# Patient Record
Sex: Male | Born: 1935 | Race: Black or African American | Hispanic: No | Marital: Single | Smoking: Current every day smoker
Health system: Southern US, Community
[De-identification: ages and names within clinical notes are randomized; demographics above are authoritative.]

## PROBLEM LIST (undated history)

## (undated) DIAGNOSIS — D696 Thrombocytopenia, unspecified: Secondary | ICD-10-CM

## (undated) DIAGNOSIS — I2510 Atherosclerotic heart disease of native coronary artery without angina pectoris: Secondary | ICD-10-CM

## (undated) DIAGNOSIS — F0390 Unspecified dementia without behavioral disturbance: Secondary | ICD-10-CM

## (undated) DIAGNOSIS — H409 Unspecified glaucoma: Secondary | ICD-10-CM

## (undated) DIAGNOSIS — I10 Essential (primary) hypertension: Secondary | ICD-10-CM

## (undated) HISTORY — PX: OTHER SURGICAL HISTORY: SHX169

---

## 2005-11-24 ENCOUNTER — Ambulatory Visit: Payer: Self-pay | Admitting: Family Medicine

## 2013-08-11 ENCOUNTER — Ambulatory Visit: Payer: Self-pay | Admitting: Internal Medicine

## 2015-12-02 ENCOUNTER — Emergency Department
Admission: EM | Admit: 2015-12-02 | Discharge: 2015-12-02 | Disposition: A | Discharge status: Home / Self Care | Payer: Medicare Other | Attending: Emergency Medicine | Admitting: Emergency Medicine

## 2015-12-02 ENCOUNTER — Encounter: Payer: Self-pay | Admitting: Emergency Medicine

## 2015-12-02 DIAGNOSIS — R21 Rash and other nonspecific skin eruption: Secondary | ICD-10-CM | POA: Diagnosis present

## 2015-12-02 DIAGNOSIS — L309 Dermatitis, unspecified: Secondary | ICD-10-CM

## 2015-12-02 HISTORY — DX: Thrombocytopenia, unspecified: D69.6

## 2015-12-02 HISTORY — DX: Essential (primary) hypertension: I10

## 2015-12-02 HISTORY — DX: Unspecified dementia, unspecified severity, without behavioral disturbance, psychotic disturbance, mood disturbance, and anxiety: F03.90

## 2015-12-02 HISTORY — DX: Atherosclerotic heart disease of native coronary artery without angina pectoris: I25.10

## 2015-12-02 LAB — GLUCOSE, CAPILLARY: GLUCOSE-CAPILLARY: 118 mg/dL — AB (ref 65–99)

## 2015-12-02 MED ORDER — CLOBETASOL PROPIONATE 0.05 % EX CREA: 1.0000 "application " | TOPICAL_CREAM | Freq: Two times a day (BID) | CUTANEOUS | Status: DC

## 2015-12-02 MED ORDER — DIPHENHYDRAMINE HCL 25 MG PO CAPS
25.0000 mg | ORAL_CAPSULE | Freq: Once | ORAL | Status: AC
  Administered 2015-12-02: 25 mg via ORAL
  Filled 2015-12-02: qty 1

## 2015-12-02 NOTE — ED Notes (Addendum)
Pt presents to ED from [facility] with c/o rash from unknown source. Per EMS, facility stated this has been happening for a month; he has been scratching his arms a lot. Pt is alert and oriented on arrival.

## 2015-12-02 NOTE — ED Provider Notes (Signed)
[facility] Emergency Department Provider Note  ____________________________________________  Time seen: Approximately 7:51 PM  I have reviewed the triage vital signs and the nursing notes.   HISTORY  Chief Complaint Allergic Reaction and Rash    HPI Ronald Matthews is a 82 y.o. male presents for evaluation of an itchy rash over his neck and both forearms for well over a month. Patient reports this is likely been there for well over 6 months, but he is frequently itching sometimes point he breaks the skin.  Currently states he is not itching, but was itching at assisted living. This improved after they applied a skin cream.  Denies any fevers chills chest pain or trouble breathing. No changes in medications. No trouble swallowing. No wheezing. He otherwise feels well. He reports he had the same rash several years ago and it went away after treatment but is not quite sure what it was.  Reports he has a history of high blood pressure No past medical history on file.  There are no active problems to display for this patient.   No past surgical history on file.  Current Outpatient Rx  Name  Route  Sig  Dispense  Refill  . clobetasol cream (TEMOVATE) 0.05 %   Topical   Apply 1 application topically 2 (two) times daily.   30 g   0     Allergies Review of patient's allergies indicates no known allergies.  No family history on file.  Social History Social History  Substance Use Topics  . Smoking status: Not on file  . Smokeless tobacco: Not on file  . Alcohol Use: Not on file    Review of Systems Constitutional: No fever/chills Eyes: No visual changes. ENT: No sore throat. Cardiovascular: Denies chest pain. Respiratory: Denies shortness of breath. Gastrointestinal: No abdominal pain.  No nausea, no vomiting.  No diarrhea.  No constipation. Genitourinary: Negative for dysuria. Musculoskeletal: Negative for back pain. Skin: No weeping or  oozing. Reports itchy rash over arms and neck for well over a months. Neurological: Negative for headaches, focal weakness or numbness.  10-point ROS otherwise negative.  ____________________________________________   PHYSICAL EXAM:  VITAL SIGNS: ED Triage Vitals  Enc Vitals Group     BP 12/02/15 1949 150/79 mmHg     Pulse Rate 12/02/15 1949 84     Resp --      Temp 12/02/15 1949 98 F (36.7 C)     Temp Source 12/02/15 1949 Oral     SpO2 12/02/15 1949 98 %     Weight 12/02/15 1949 185 lb 1.6 oz (83.961 kg)     Height 12/02/15 1949  (1.93 m)     Head Cir --      Peak Flow --      Pain Score --      Pain Loc --      Pain Edu? --      Excl. in GC? --    Constitutional: Alert and oriented. Well appearing and in no acute distress. Eyes: Conjunctivae are normal. PERRL. EOMI. Head: Atraumatic. Nose: No congestion/rhinnorhea. Mouth/Throat: Mucous membranes are moist.  Oropharynx non-erythematous. No edema. Neck: No stridor.  Patient has a thick leathery non-erythematous plaque-like rash over the anterior neck. Cardiovascular: Normal rate, regular rhythm. Grossly normal heart sounds.  Good peripheral circulation. Respiratory: Normal respiratory effort.  No retractions. Lungs CTAB. Gastrointestinal: Soft and nontender. No distention. No abdominal bruits. No CVA tenderness. Musculoskeletal: No lower extremity tenderness nor edema.  Neurologic:  Normal speech and language. No gross focal neurologic deficits are appreciated. Skin:  Skin is warm, dry and intact. Patient has a thick, leathery, plaque-like rash over the extensor surfaces of the forearms bilateral.  Psychiatric: Mood and affect are normal. Speech and behavior are normal.  ____________________________________________   LABS (all labs ordered are listed, but only abnormal results are displayed)  Labs Reviewed  CBG MONITORING, ED    ____________________________________________  EKG   ____________________________________________  RADIOLOGY   ____________________________________________   PROCEDURES  Procedure(s) performed: None  Critical Care performed: No  ____________________________________________   INITIAL IMPRESSION / ASSESSMENT AND PLAN / ED COURSE  Pertinent labs & imaging results that were available during my care of the patient were reviewed by me and considered in my medical decision making (see chart for details).  Patient presents with a thick leathery plaque-like rash over the extensor surfaces of the forearms as well as the anterior neck. Has been existing for well over the month but continues to have itching from it. He is hemodynamically stable and no evidence of anaphylaxis, feel this is likely some sort of dermatitis or possibly severe chronic psoriasis based on my assessment. He is awake and alert. No itching at this time, and he reports symptoms did improve with moisturizing cream.  Patient resting comfortably in no distress. States no symptoms presently. I did discuss careful return precautions and notified him that he does need to follow-up with dermatology as we are not sure the cause of this rash. I recommended he follow up with them within a week or next available appointment and provided him referral information. Careful return precautions advised. Patient agreeable ____________________________________________   FINAL CLINICAL IMPRESSION(S) / ED DIAGNOSES  Final diagnoses:  Dermatitis      Sharyn Creamer, MD 12/02/15 2046

## 2015-12-02 NOTE — Discharge Instructions (Signed)
I suspect here rash is due to a chronic inflammatory condition. However, I'm not able to make a certain diagnosis. Does not appear to his causing any emergency complications. It does not appear to be infected. I would advise use of steroid cream once to twice daily until the symptoms improve. Please call and follow up with dermatology within the week, and call your primary care doctor for follow-up tomorrow.  Please return to emergency room right away if you develop fever, a red rash, itching becomes severe, you have trouble breathing, swelling, weakness, or other new concerns arise.

## 2015-12-03 ENCOUNTER — Telehealth: Payer: Self-pay | Admitting: Emergency Medicine

## 2015-12-03 NOTE — ED Notes (Signed)
[facility] and says the cream ordered is not covered by pt insurance.  Per dr Mayford Knife can change rx to triamcinalone cream 0.1% in 80 gm tube.  Apply twice daily.  No refills.

## 2018-07-11 ENCOUNTER — Emergency Department
Admission: EM | Admit: 2018-07-11 | Discharge: 2018-07-11 | Disposition: A | Discharge status: Skilled Nursing Facility | Payer: Medicare Other | Attending: Emergency Medicine | Admitting: Emergency Medicine

## 2018-07-11 ENCOUNTER — Emergency Department: Payer: Medicare Other

## 2018-07-11 ENCOUNTER — Other Ambulatory Visit: Payer: Self-pay

## 2018-07-11 ENCOUNTER — Emergency Department
Admission: EM | Admit: 2018-07-11 | Discharge: 2018-07-11 | Disposition: A | Discharge status: Home / Self Care | Payer: Medicare Other | Source: Home / Self Care | Attending: Emergency Medicine | Admitting: Emergency Medicine

## 2018-07-11 ENCOUNTER — Encounter: Payer: Self-pay | Admitting: Emergency Medicine

## 2018-07-11 DIAGNOSIS — F0390 Unspecified dementia without behavioral disturbance: Secondary | ICD-10-CM

## 2018-07-11 DIAGNOSIS — R29898 Other symptoms and signs involving the musculoskeletal system: Secondary | ICD-10-CM

## 2018-07-11 DIAGNOSIS — R531 Weakness: Secondary | ICD-10-CM | POA: Insufficient documentation

## 2018-07-11 DIAGNOSIS — F17200 Nicotine dependence, unspecified, uncomplicated: Secondary | ICD-10-CM | POA: Insufficient documentation

## 2018-07-11 DIAGNOSIS — Z79899 Other long term (current) drug therapy: Secondary | ICD-10-CM | POA: Insufficient documentation

## 2018-07-11 DIAGNOSIS — I2510 Atherosclerotic heart disease of native coronary artery without angina pectoris: Secondary | ICD-10-CM

## 2018-07-11 DIAGNOSIS — I10 Essential (primary) hypertension: Secondary | ICD-10-CM

## 2018-07-11 LAB — COMPREHENSIVE METABOLIC PANEL
ALK PHOS: 51 U/L (ref 38–126)
ALT: 12 U/L (ref 0–44)
AST: 20 U/L (ref 15–41)
Albumin: 4 g/dL (ref 3.5–5.0)
Anion gap: 7 (ref 5–15)
BILIRUBIN TOTAL: 0.7 mg/dL (ref 0.3–1.2)
BUN: 20 mg/dL (ref 8–23)
CHLORIDE: 107 mmol/L (ref 98–111)
CO2: 26 mmol/L (ref 22–32)
CREATININE: 0.91 mg/dL (ref 0.61–1.24)
Calcium: 8.9 mg/dL (ref 8.9–10.3)
GFR calc Af Amer: >60 mL/min (ref >60)
Glucose, Bld: 107 mg/dL — ABNORMAL HIGH (ref 70–99)
Potassium: 4.3 mmol/L (ref 3.5–5.1)
Sodium: 140 mmol/L (ref 135–145)
Total Protein: 7 g/dL (ref 6.5–8.1)

## 2018-07-11 LAB — URINALYSIS, ROUTINE W REFLEX MICROSCOPIC
Bilirubin Urine: NEGATIVE
GLUCOSE, UA: NEGATIVE mg/dL
Ketones, ur: 5 mg/dL — AB
Leukocytes, UA: NEGATIVE
Nitrite: NEGATIVE
PROTEIN: NEGATIVE mg/dL
SQUAMOUS EPITHELIAL / LPF: NONE SEEN (ref 0–5)
Specific Gravity, Urine: 1.019 (ref 1.005–1.030)
pH: 6 (ref 5.0–8.0)

## 2018-07-11 LAB — CBC
HCT: 39.3 % — ABNORMAL LOW (ref 40.0–52.0)
HEMOGLOBIN: 13.1 g/dL (ref 13.0–18.0)
MCH: 28.7 pg (ref 26.0–34.0)
MCHC: 33.4 g/dL (ref 32.0–36.0)
MCV: 86 fL (ref 80.0–100.0)
Platelets: 187 10*3/uL (ref 150–440)
RBC: 4.57 MIL/uL (ref 4.40–5.90)
RDW: 14.6 % — ABNORMAL HIGH (ref 11.5–14.5)
WBC: 4.6 10*3/uL (ref 3.8–10.6)

## 2018-07-11 LAB — TROPONIN I

## 2018-07-11 NOTE — ED Notes (Signed)
Pt resting quietly. Pt is waiting for ems to transport.

## 2018-07-11 NOTE — ED Notes (Signed)
When speaking with the other triage nurse, EMS brought pt back because his place of residence is requesting him to have an MRI.

## 2018-07-11 NOTE — Discharge Instructions (Signed)
Please return to the emergency department if you develop any symptoms concerning to you.

## 2018-07-11 NOTE — ED Notes (Signed)
Spoke to [employer] at [facility] [phone]) who stated to send pt back by ambulance.

## 2018-07-11 NOTE — ED Triage Notes (Signed)
Pt reports that he does not know why they keep bringing him over here, he was here earlier today and he feels fine. He reports that he does not fell any more weak than he usually does.

## 2018-07-11 NOTE — Discharge Instructions (Addendum)
You have been seen in the emergency department today for weakness.  Your work-up has shown normal results.  Please follow-up with your primary care doctor as soon as possible for recheck/reevaluation.  Return to the emergency department for any symptoms personally concerning to yourself or staff members.

## 2018-07-11 NOTE — ED Notes (Signed)
In and out cath completed at this time. Sterile technique maintained. Patient tolerated well. Clear, amber urine returned.

## 2018-07-11 NOTE — ED Notes (Signed)
Per MD pt is ready for d/c.

## 2018-07-11 NOTE — ED Provider Notes (Signed)
[facility] Emergency Department Provider Note  Time seen: 9:01 AM  I have reviewed the triage vital signs and the nursing notes.   HISTORY  Chief Complaint Weakness    HPI Ronald Matthews is a 84 y.o. male with a past medical history of CAD, hypertension, dementia, presents to the emergency department for lower extreme any weakness.  According to the patient he has been weak in his legs for a long time now per patient.  States he lives at a nursing home and they wanted him to come get checked out.  Patient denies any focal weakness.  Denies any weakness in arm or leg headache confusion difficulty thinking or speaking.  Patient is well-appearing and has no complaints at this time.  Largely negative review of systems.   Past Medical History:  Diagnosis Date  . Coronary artery disease   . Dementia   . Hypertension   . Thrombocytopenia (HCC)     There are no active problems to display for this patient.   History reviewed. No pertinent surgical history.  Prior to Admission medications   Medication Sig Start Date End Date Taking? Authorizing Provider  aspirin EC 81 MG tablet Take 81 mg by mouth daily.    [provider]  clobetasol cream (TEMOVATE) 0.05 % Apply 1 application topically 2 (two) times daily. 12/02/15   Sharyn Creamer, MD  metoprolol tartrate (LOPRESSOR) 25 MG tablet Take 25 mg by mouth 2 (two) times daily.    [provider]    No Known Allergies  No family history on file.  Social History Social History   Tobacco Use  . Smoking status: Current Every Day Smoker  . Smokeless tobacco: Never Used  Substance Use Topics  . Alcohol use: No  . Drug use: No    Review of Systems Constitutional: Negative for fever. ENT: Negative for recent illness/congestion Cardiovascular: Negative for chest pain. Respiratory: Negative for shortness of breath. Gastrointestinal: Negative for abdominal pain, vomiting and  diarrhea. Genitourinary: Negative for urinary compaints Musculoskeletal: Negative for musculoskeletal complaints Skin: Negative for skin complaints  Neurological: Negative for headache.  Mild lower extremity weakness bilaterally states this is chronic however. All other ROS negative  ____________________________________________   PHYSICAL EXAM:  VITAL SIGNS: ED Triage Vitals  Enc Vitals Group     BP 07/11/18 0858 (!) 148/76     Pulse Rate 07/11/18 0858 60     Resp 07/11/18 0858 18     Temp 07/11/18 0858 98.5 F (36.9 C)     Temp Source 07/11/18 0858 Oral     SpO2 07/11/18 0858 100 %     Weight 07/11/18 0859 185 lb 3 oz (84 kg)     Height 07/11/18 0859 6\' 4"  (1.93 m)     Head Circumference --      Peak Flow --      Pain Score --      Pain Loc --      Pain Edu? --      Excl. in GC? --    Constitutional: Alert. Well appearing and in no distress. Eyes: Normal exam ENT   Head: Normocephalic and atraumatic.   Mouth/Throat: Mucous membranes are moist. Cardiovascular: Normal rate, regular rhythm. No murmur Respiratory: Normal respiratory effort without tachypnea nor retractions. Breath sounds are clear Gastrointestinal: Soft and nontender. No distention.   Musculoskeletal: Nontender with normal range of motion in all extremities. No lower extremity tenderness or edema. Neurologic:  Normal speech and language. No gross  focal neurologic deficits.  Great grip strength bilaterally, good leg strength bilaterally, no focal deficit identified. Skin:  Skin is warm, dry and intact.  Psychiatric: Mood and affect are normal.   ____________________________________________    EKG  EKG reviewed and interpreted by myself shows normal sinus rhythm at 68 bpm with a narrow QRS, normal axis, normal intervals, no concerning ST changes.  Overall normal EKG.  ____________________________________________    RADIOLOGY  CT scan negative for acute  abnormality.  ____________________________________________   INITIAL IMPRESSION / ASSESSMENT AND PLAN / ED COURSE  Pertinent labs & imaging results that were available during my care of the patient were reviewed by me and considered in my medical decision making (see chart for details).  Patient presents to the emergency department for lower extremity weakness sent by his nursing facility.  Differential would include CVA, electrolyte or metabolic abnormality, infectious etiology such as urinary tract infection.  We will check labs, IV hydrate, check urinalysis as well as CT scan of the head as a precaution.  Overall the patient appears very well.  CT scan was negative for acute abnormality.  Labs are largely within normal limits including a negative troponin, overall well-appearing urinalysis.  Urine culture has been sent as a precaution.  As the patient appears well otherwise I believe he is safe for discharge home.  We will attempt to ambulate the patient to ensure he is able to walk safely prior to discharge.  ____________________________________________   FINAL CLINICAL IMPRESSION(S) / ED DIAGNOSES  Weakness    Minna Antis, MD 07/11/18 1034

## 2018-07-11 NOTE — ED Triage Notes (Signed)
Pt came to ED via EMS from [facility] assisted [facility]. Pt reports weakness, specifically in legs. Pt reports has been going for some time. Denies pain, some numbness in both legs. VS stable.

## 2018-07-11 NOTE — ED Provider Notes (Addendum)
[facility] Emergency Department Provider Note  ____________________________________________  Time seen: Approximately 5:44 PM  I have reviewed the triage vital signs and the nursing notes.   HISTORY  Chief Complaint Weakness    HPI Ronald Matthews is a 84 y.o. male with a history of HTN, CAD, and dementia living in a nursing facility, sent back to the emergency department for second time today for bilateral lower extremity weakness.  The patient has no complaints and did not feel that he needed to come back to the emergency department.  He states that over a period of weeks, he has progressively had more difficulty walking; he used to be able to get around with a cane, and is finding this more difficult now.  He denies any saddle anesthesia, urinary or fecal incontinence or retention, numbness or tingling, or any focal weakness.  He has had no headaches.  The patient was seen in the emergency department this morning with a reassuring work-up including stable vital signs, normal laboratory studies, and a CT scan of the head that did not show any acute intracranial process.  He was discharged back to his nursing facility and is now re-presenting with a note that states that a physician would like him to have an MRI but I am unable to corroborate this or talk to the physician who sent him.  Past Medical History:  Diagnosis Date  . Coronary artery disease   . Dementia   . Hypertension   . Thrombocytopenia (HCC)     There are no active problems to display for this patient.   History reviewed. No pertinent surgical history.  Current Outpatient Rx  . Order #: [id number] Class: Historical Med  . Order #: [id number] Class: Print  . Order #: [id number] Class: Historical Med    Allergies Patient has no known allergies.  History reviewed. No pertinent family history.  Social History Social History   Tobacco Use  . Smoking status: Current Every Day Smoker  .  Smokeless tobacco: Never Used  Substance Use Topics  . Alcohol use: No  . Drug use: No    Review of Systems Constitutional: No fever/chills.  No lightheadedness or fainting.  No recent falls. Eyes: No visual changes.  No blurred or double vision. ENT: No sore throat. No congestion or rhinorrhea. Cardiovascular: Denies chest pain. Denies palpitations. Respiratory: Denies shortness of breath.  No cough. Gastrointestinal: No abdominal pain.  No nausea, no vomiting.  No diarrhea.  No constipation. Genitourinary: Negative for dysuria. Musculoskeletal: Negative for back pain.  No neck pain.  Positive difficulty walking over the last few weeks due to bilateral lower extremity weakness. Skin: Negative for rash. Neurological: Negative for headaches. No focal numbness, tingling.  Bilateral lower externally weakness.  No changes in vision or speech.    ____________________________________________   PHYSICAL EXAM:  VITAL SIGNS: ED Triage Vitals  Enc Vitals Group     BP 07/11/18 1546 131/64     Pulse --      Resp 07/11/18 1546 16     Temp 07/11/18 1546 98.9 F (37.2 C)     Temp src --      SpO2 07/11/18 1546 99 %     Weight 07/11/18 1547 185 lb 3 oz (84 kg)     Height 07/11/18 1547 6\' 4"  (1.93 m)     Head Circumference --      Peak Flow --      Pain Score 07/11/18 1546 0     Pain Loc --  Pain Edu? --      Excl. in GC? --     Constitutional: The patient is alert and able to answer some questions; overall he is appropriate and in no acute distress.. Eyes: Conjunctivae are normal.  EOMI. positive arcus senilis.  PERRLA.  No scleral icterus. Head: Atraumatic. Nose: No congestion/rhinnorhea. Mouth/Throat: Mucous membranes are moist.  Neck: No stridor.  Supple.  No JVD.  No midline C-spine tenderness to palpation, step-offs or deformities. Cardiovascular: Normal rate, regular rhythm. No murmurs, rubs or gallops.  Respiratory: Normal respiratory effort.  No accessory muscle use or  retractions. Lungs CTAB.  No wheezes, rales or ronchi. Gastrointestinal: Soft, nontender and nondistended.  No guarding or rebound.  No peritoneal signs. Musculoskeletal: No midline thoracic or lumbar spine tenderness to palpation, step-offs or deformities.  No LE edema. No ttp in the calves or palpable cords.  Negative Homan's sign. Neurologic: Alert and oriented 2. Speech is clear.  Face and smile symmetric.  EOMI.  PERRLA.  No horizontal or vertical nystagmus. No pronator drift. 5 out of 5 grip, biceps, triceps, hip flexors, plantar flexion and dorsiflexion. Normal sensation to light touch in the bilateral upper and lower extremities, and face.  I did ambulate the patient, who used a cane and had some instability but no ataxia. Skin:  Skin is warm, dry and intact. No rash noted. Psychiatric: Mood and affect are normal.   ____________________________________________   LABS (all labs ordered are listed, but only abnormal results are displayed)  Labs Reviewed - No data to display ____________________________________________  EKG  Not indicated ____________________________________________  RADIOLOGY  Ct Head Wo Contrast  Result Date: 07/11/2018 CLINICAL DATA:  84 year old male with weakness specifically in legs ongoing for some time. Denies pain. Initial encounter. EXAM: CT HEAD WITHOUT CONTRAST TECHNIQUE: Contiguous axial images were obtained from the base of the skull through the vertex without intravenous contrast. COMPARISON:  None. FINDINGS: Brain: No intracranial hemorrhage. Prominent chronic microvascular changes. This limits evaluation for detection of a small infarct (particularly at the level of the anterior limb of the right internal capsule). No CT evidence of large acute infarct. Remote small basal ganglia infarcts. Moderate global atrophy. Dilated right occipital horn appears long-standing. Prominent size ventricles probably related to central atrophy rather than hydrocephalus.  Aqueduct is patent. Slightly hypoplastic inferior vermis. No intracranial mass lesion noted on this unenhanced exam. Vascular: Vascular calcifications.  No hyperdense vessel. Skull: No acute abnormality. Sinuses/Orbits: No acute orbital abnormality. Minimal mucosal thickening right maxillary sinus. Other: Mastoid air cells and middle ear cavities are clear. IMPRESSION: 1. No intracranial hemorrhage. 2. Prominent chronic microvascular changes. This limits evaluation for detection of a small infarct (particularly at the level of the anterior limb of the right internal capsule). No CT evidence of large acute infarct. Remote small basal ganglia infarcts. 3. Moderate global atrophy. Dilated right occipital horn appears long-standing. Prominent size ventricles probably related to central atrophy rather than hydrocephalus. Electronically Signed   By: Lacy Duverney M.D.   On: 07/11/2018 10:00    ____________________________________________   PROCEDURES  Procedure(s) performed: None  Procedures  Critical Care performed: No ____________________________________________   INITIAL IMPRESSION / ASSESSMENT AND PLAN / ED COURSE  Pertinent labs & imaging results that were available during my care of the patient were reviewed by me and considered in my medical decision making (see chart for details).  84 y.o. male with dementia presenting for bilateral lower extremity weakness, second presentation today with a note from the nursing  facility that they wish for him to have an MRI.  I do not even know what kind of MR was recommended to the patient by his physician; neither MRI of the brain or lumbar spine are indicated in an emergency situation.  There is no evidence of acute neurologic deficit or focal stroke and the patient CT scan was negative.  There is no evidence of spinal cord compression or cauda equina today.  After talking to the nursing facility, I was given the number for [facility], [phone], and  have left a message to to see if there were any additional concerns.  ----------------------------------------- 6:34 PM on 07/11/2018 -----------------------------------------  I have spoken with Ree Kida, the NP from the nursing facility, and then afterwards Dr. Ardelle Park, the attending physician for the facility; neither of these practitioners have seen the patient today.  I have explained the patient's work-up this morning as well as his current status.  Dr. Ardelle Park agrees that the patient does not require emergent MRI tonight and will see the patient in the morning.  ____________________________________________  FINAL CLINICAL IMPRESSION(S) / ED DIAGNOSES  Final diagnoses:  Bilateral leg weakness         NEW MEDICATIONS STARTED DURING THIS VISIT:  New Prescriptions   No medications on file      Rockne Menghini, MD 07/11/18 1757    Rockne Menghini, MD 07/11/18 [phone]

## 2018-07-11 NOTE — ED Notes (Signed)
Report called to [facility] facility spoke with Liborio Nixon, informed that pt will be returning to facility via ems.

## 2018-07-11 NOTE — ED Notes (Signed)
[facility]  CALLED  FOR  TRANSPORT 

## 2018-07-12 LAB — URINE CULTURE

## 2018-07-13 ENCOUNTER — Emergency Department: Payer: Medicare Other

## 2018-07-13 ENCOUNTER — Other Ambulatory Visit: Payer: Self-pay

## 2018-07-13 ENCOUNTER — Emergency Department
Admission: EM | Admit: 2018-07-13 | Discharge: 2018-07-13 | Disposition: A | Discharge status: Home / Self Care | Payer: Medicare Other | Attending: Emergency Medicine | Admitting: Emergency Medicine

## 2018-07-13 DIAGNOSIS — Y999 Unspecified external cause status: Secondary | ICD-10-CM | POA: Diagnosis not present

## 2018-07-13 DIAGNOSIS — I10 Essential (primary) hypertension: Secondary | ICD-10-CM | POA: Insufficient documentation

## 2018-07-13 DIAGNOSIS — Z79899 Other long term (current) drug therapy: Secondary | ICD-10-CM | POA: Insufficient documentation

## 2018-07-13 DIAGNOSIS — Y939 Activity, unspecified: Secondary | ICD-10-CM | POA: Insufficient documentation

## 2018-07-13 DIAGNOSIS — Y929 Unspecified place or not applicable: Secondary | ICD-10-CM | POA: Diagnosis not present

## 2018-07-13 DIAGNOSIS — W050XXA Fall from non-moving wheelchair, initial encounter: Secondary | ICD-10-CM | POA: Diagnosis not present

## 2018-07-13 DIAGNOSIS — T1490XA Injury, unspecified, initial encounter: Secondary | ICD-10-CM | POA: Diagnosis not present

## 2018-07-13 DIAGNOSIS — I2510 Atherosclerotic heart disease of native coronary artery without angina pectoris: Secondary | ICD-10-CM | POA: Insufficient documentation

## 2018-07-13 DIAGNOSIS — Z7982 Long term (current) use of aspirin: Secondary | ICD-10-CM | POA: Insufficient documentation

## 2018-07-13 DIAGNOSIS — W19XXXA Unspecified fall, initial encounter: Secondary | ICD-10-CM

## 2018-07-13 DIAGNOSIS — F17200 Nicotine dependence, unspecified, uncomplicated: Secondary | ICD-10-CM | POA: Diagnosis not present

## 2018-07-13 DIAGNOSIS — F0390 Unspecified dementia without behavioral disturbance: Secondary | ICD-10-CM | POA: Insufficient documentation

## 2018-07-13 NOTE — ED Notes (Signed)
Called EMS and spoke with [facility].

## 2018-07-13 NOTE — ED Provider Notes (Signed)
[facility] Emergency Department Provider Note   ____________________________________________   First MD Initiated Contact with Patient 07/13/18 1748     (approximate)  I have reviewed the triage vital signs and the nursing notes.   HISTORY  Chief Complaint No chief complaint on file.  Chief complaint is unwitnessed fall  HPI Ronald Matthews is a 84 y.o. male patient says he was trying to get into his wheelchair when he missed his sitting and fell.  He was sent here because his fall was unwitnessed.  He denies any pain says everything is feeling well.  Past Medical History:  Diagnosis Date  . Coronary artery disease   . Dementia   . Hypertension   . Thrombocytopenia (HCC)     There are no active problems to display for this patient.   History reviewed. No pertinent surgical history.  Prior to Admission medications   Medication Sig Start Date End Date Taking? Authorizing Provider  aspirin EC 81 MG tablet Take 81 mg by mouth daily.    [provider]  clobetasol cream (TEMOVATE) 0.05 % Apply 1 application topically 2 (two) times daily. 12/02/15   Sharyn Creamer, MD  metoprolol tartrate (LOPRESSOR) 25 MG tablet Take 25 mg by mouth 2 (two) times daily.    [provider]    Allergies Patient has no known allergies.  History reviewed. No pertinent family history.  Social History Social History   Tobacco Use  . Smoking status: Current Every Day Smoker  . Smokeless tobacco: Never Used  Substance Use Topics  . Alcohol use: No  . Drug use: No    Review of Systems  Constitutional: No fever/chills Eyes: No visual changes. ENT: No sore throat. Cardiovascular: Denies chest pain. Respiratory: Denies shortness of breath. Gastrointestinal: No abdominal pain.  No nausea, no vomiting.  No diarrhea.  No constipation. Genitourinary: Negative for dysuria. Musculoskeletal: Negative for back pain. Skin: Negative for  rash. Neurological: Negative for headaches, focal weakness ___________________   PHYSICAL EXAM:  VITAL SIGNS: ED Triage Vitals  Enc Vitals Group     BP --      Pulse Rate 07/13/18 1732 77     Resp 07/13/18 1732 14     Temp 07/13/18 1732 98.8 F (37.1 C)     Temp Source 07/13/18 1732 Oral     SpO2 07/13/18 1732 94 %     Weight 07/13/18 1734 185 lb 3 oz (84 kg)     Height 07/13/18 1734 6\' 4"  (1.93 m)     Head Circumference --      Peak Flow --      Pain Score 07/13/18 1734 0     Pain Loc --      Pain Edu? --      Excl. in GC? --     Constitutional: Alert and oriented. Well appearing and in no acute distress. Eyes: Conjunctivae are normal.  Head: Atraumatic. Nose: No congestion/rhinnorhea. Mouth/Throat: Mucous membranes are moist.  Oropharynx non-erythematous. Neck: No stridor. Cardiovascular: Normal rate, regular rhythm. Grossly normal heart sounds.  Good peripheral circulation. Respiratory: Normal respiratory effort.  No retractions. Lungs CTAB. Gastrointestinal: Soft and nontender. No distention. No abdominal bruits. No CVA tenderness. Musculoskeletal: No lower extremity tenderness nor edema.   Neurologic:  Normal speech and language. No gross focal neurologic deficits are appreciated. Psychiatric: Mood and affect are normal. Speech and behavior are normal.  ____________________________________________   LABS (all labs ordered are listed, but only abnormal results are displayed)  Labs Reviewed - No data to display ____________________________________________  EKG   ____________________________________________  RADIOLOGY  ED MD interpretation: CT of the head and neck read as no acute disease by radiology I have reviewed the films and agree  Official radiology report(s): Ct Head Wo Contrast  Result Date: 07/13/2018 CLINICAL DATA:  Unwitnessed fall EXAM: CT HEAD WITHOUT CONTRAST CT CERVICAL SPINE WITHOUT CONTRAST TECHNIQUE: Multidetector CT imaging of the head  and cervical spine was performed following the standard protocol without intravenous contrast. Multiplanar CT image reconstructions of the cervical spine were also generated. COMPARISON:  07/11/2018 head CT FINDINGS: CT HEAD FINDINGS Brain: No acute territorial infarction, hemorrhage or intracranial mass. Moderate small vessel ischemic changes of the white matter. Probable chronic lacunar infarcts within the bilateral basal ganglia. The ventricles are slightly enlarged, likely due to atrophy. Vascular: No hyperdense vessels. Vertebral and carotid vascular calcification Skull: Normal. Negative for fracture or focal lesion. Sinuses/Orbits: No acute finding. Other: None CT CERVICAL SPINE FINDINGS Alignment: Mild reversal of cervical lordosis. No subluxation. Facet alignment within normal limits. Skull base and vertebrae: No acute fracture. No primary bone lesion or focal pathologic process. Soft tissues and spinal canal: No prevertebral fluid or swelling. No visible canal hematoma. Disc levels: Moderate degenerative changes at C3-C4, C5-C6 and C6-C7 with marked degenerative change at C4-C5. Multiple level facet degenerative changes. Multiple level foraminal narrowing, most significant at C4-C5. Upper chest: Negative. Other: None IMPRESSION: 1. No CT evidence for acute intracranial abnormality. Small-vessel ischemic changes of the white matter and mild atrophy. 2. Reversal of cervical lordosis with degenerative changes. No acute fracture is seen. Electronically Signed   By: Jasmine Pang M.D.   On: 07/13/2018 18:59   Ct Cervical Spine Wo Contrast  Result Date: 07/13/2018 CLINICAL DATA:  Unwitnessed fall EXAM: CT HEAD WITHOUT CONTRAST CT CERVICAL SPINE WITHOUT CONTRAST TECHNIQUE: Multidetector CT imaging of the head and cervical spine was performed following the standard protocol without intravenous contrast. Multiplanar CT image reconstructions of the cervical spine were also generated. COMPARISON:  07/11/2018 head  CT FINDINGS: CT HEAD FINDINGS Brain: No acute territorial infarction, hemorrhage or intracranial mass. Moderate small vessel ischemic changes of the white matter. Probable chronic lacunar infarcts within the bilateral basal ganglia. The ventricles are slightly enlarged, likely due to atrophy. Vascular: No hyperdense vessels. Vertebral and carotid vascular calcification Skull: Normal. Negative for fracture or focal lesion. Sinuses/Orbits: No acute finding. Other: None CT CERVICAL SPINE FINDINGS Alignment: Mild reversal of cervical lordosis. No subluxation. Facet alignment within normal limits. Skull base and vertebrae: No acute fracture. No primary bone lesion or focal pathologic process. Soft tissues and spinal canal: No prevertebral fluid or swelling. No visible canal hematoma. Disc levels: Moderate degenerative changes at C3-C4, C5-C6 and C6-C7 with marked degenerative change at C4-C5. Multiple level facet degenerative changes. Multiple level foraminal narrowing, most significant at C4-C5. Upper chest: Negative. Other: None IMPRESSION: 1. No CT evidence for acute intracranial abnormality. Small-vessel ischemic changes of the white matter and mild atrophy. 2. Reversal of cervical lordosis with degenerative changes. No acute fracture is seen. Electronically Signed   By: Jasmine Pang M.D.   On: 07/13/2018 18:59    ____________________________________________   PROCEDURES  Procedure(s) performed:   Procedures  Critical Care performed:   ____________________________________________   INITIAL IMPRESSION / ASSESSMENT AND PLAN / ED COURSE  Patient looks well has no complaints I will send him back.  He says he just missed where he was sitting when he fell.  ____________________________________________   FINAL CLINICAL IMPRESSION(S) / ED DIAGNOSES  Final diagnoses:  Fall, initial encounter     ED Discharge Orders    None       Note:  This document was prepared using Dragon  voice recognition software and may include unintentional dictation errors.    Arnaldo Natal, MD 07/13/18 Ebony Cargo

## 2018-07-13 NOTE — ED Triage Notes (Signed)
Pt arrived via EMS from [facility] after unwitnessed fall. Denies pain. Pt states that he was trying to get from his bed to his chair and fell from standing position. Denies hitting head.

## 2018-07-13 NOTE — Discharge Instructions (Addendum)
Please return for any further problems °

## 2018-07-14 ENCOUNTER — Encounter (HOSPITAL_COMMUNITY): Payer: Self-pay | Admitting: Emergency Medicine

## 2018-07-14 ENCOUNTER — Emergency Department (HOSPITAL_COMMUNITY): Payer: Medicare Other

## 2018-07-14 ENCOUNTER — Inpatient Hospital Stay (HOSPITAL_COMMUNITY)
Admission: EM | Admit: 2018-07-14 | Discharge: 2018-07-18 | DRG: 065 | Disposition: A | Discharge status: Skilled Nursing Facility | Payer: Medicare Other | Source: Skilled Nursing Facility | Attending: Internal Medicine | Admitting: Internal Medicine

## 2018-07-14 ENCOUNTER — Other Ambulatory Visit: Payer: Self-pay

## 2018-07-14 DIAGNOSIS — R4701 Aphasia: Secondary | ICD-10-CM | POA: Diagnosis present

## 2018-07-14 DIAGNOSIS — D696 Thrombocytopenia, unspecified: Secondary | ICD-10-CM | POA: Diagnosis present

## 2018-07-14 DIAGNOSIS — R1310 Dysphagia, unspecified: Secondary | ICD-10-CM | POA: Diagnosis present

## 2018-07-14 DIAGNOSIS — I10 Essential (primary) hypertension: Secondary | ICD-10-CM | POA: Diagnosis present

## 2018-07-14 DIAGNOSIS — I455 Other specified heart block: Secondary | ICD-10-CM | POA: Diagnosis present

## 2018-07-14 DIAGNOSIS — R471 Dysarthria and anarthria: Secondary | ICD-10-CM | POA: Diagnosis present

## 2018-07-14 DIAGNOSIS — E785 Hyperlipidemia, unspecified: Secondary | ICD-10-CM | POA: Diagnosis present

## 2018-07-14 DIAGNOSIS — Z7982 Long term (current) use of aspirin: Secondary | ICD-10-CM

## 2018-07-14 DIAGNOSIS — I63219 Cerebral infarction due to unspecified occlusion or stenosis of unspecified vertebral arteries: Principal | ICD-10-CM | POA: Diagnosis present

## 2018-07-14 DIAGNOSIS — I639 Cerebral infarction, unspecified: Secondary | ICD-10-CM | POA: Diagnosis present

## 2018-07-14 DIAGNOSIS — G8194 Hemiplegia, unspecified affecting left nondominant side: Secondary | ICD-10-CM | POA: Diagnosis present

## 2018-07-14 DIAGNOSIS — I2510 Atherosclerotic heart disease of native coronary artery without angina pectoris: Secondary | ICD-10-CM | POA: Diagnosis present

## 2018-07-14 DIAGNOSIS — F17200 Nicotine dependence, unspecified, uncomplicated: Secondary | ICD-10-CM | POA: Diagnosis present

## 2018-07-14 DIAGNOSIS — F0150 Vascular dementia without behavioral disturbance: Secondary | ICD-10-CM | POA: Diagnosis present

## 2018-07-14 DIAGNOSIS — F0390 Unspecified dementia without behavioral disturbance: Secondary | ICD-10-CM | POA: Diagnosis present

## 2018-07-14 DIAGNOSIS — I739 Peripheral vascular disease, unspecified: Secondary | ICD-10-CM | POA: Diagnosis present

## 2018-07-14 DIAGNOSIS — R32 Unspecified urinary incontinence: Secondary | ICD-10-CM | POA: Diagnosis present

## 2018-07-14 DIAGNOSIS — Z888 Allergy status to other drugs, medicaments and biological substances status: Secondary | ICD-10-CM

## 2018-07-14 DIAGNOSIS — R29810 Facial weakness: Secondary | ICD-10-CM | POA: Diagnosis present

## 2018-07-14 LAB — COMPREHENSIVE METABOLIC PANEL
ALBUMIN: 4 g/dL (ref 3.5–5.0)
ALT: 15 U/L (ref 0–44)
ANION GAP: 9 (ref 5–15)
AST: 29 U/L (ref 15–41)
Alkaline Phosphatase: 56 U/L (ref 38–126)
BUN: 17 mg/dL (ref 8–23)
CO2: 24 mmol/L (ref 22–32)
Calcium: 9.3 mg/dL (ref 8.9–10.3)
Chloride: 109 mmol/L (ref 98–111)
Creatinine, Ser: 1.03 mg/dL (ref 0.61–1.24)
GFR calc Af Amer: >60 mL/min (ref >60)
GLUCOSE: 113 mg/dL — AB (ref 70–99)
POTASSIUM: 4.5 mmol/L (ref 3.5–5.1)
Sodium: 142 mmol/L (ref 135–145)
TOTAL PROTEIN: 6.6 g/dL (ref 6.5–8.1)
Total Bilirubin: 0.5 mg/dL (ref 0.3–1.2)

## 2018-07-14 LAB — I-STAT CHEM 8, ED
BUN: 19 mg/dL (ref 8–23)
Calcium, Ion: 1.22 mmol/L (ref 1.15–1.40)
Chloride: 108 mmol/L (ref 98–111)
Creatinine, Ser: 1 mg/dL (ref 0.61–1.24)
Glucose, Bld: 108 mg/dL — ABNORMAL HIGH (ref 70–99)
HEMATOCRIT: 40 % (ref 39.0–52.0)
HEMOGLOBIN: 13.6 g/dL (ref 13.0–17.0)
Potassium: 4.6 mmol/L (ref 3.5–5.1)
SODIUM: 143 mmol/L (ref 135–145)
TCO2: 26 mmol/L (ref 22–32)

## 2018-07-14 LAB — I-STAT TROPONIN, ED: TROPONIN I, POC: 0.03 ng/mL (ref 0.00–0.08)

## 2018-07-14 LAB — CBC
HCT: 39.9 % (ref 39.0–52.0)
Hemoglobin: 12.5 g/dL — ABNORMAL LOW (ref 13.0–17.0)
MCH: 27.8 pg (ref 26.0–34.0)
MCHC: 31.3 g/dL (ref 30.0–36.0)
MCV: 88.9 fL (ref 78.0–100.0)
PLATELETS: 201 10*3/uL (ref 150–400)
RBC: 4.49 MIL/uL (ref 4.22–5.81)
RDW: 14.5 % (ref 11.5–15.5)
WBC: 6.7 10*3/uL (ref 4.0–10.5)

## 2018-07-14 LAB — DIFFERENTIAL
Abs Immature Granulocytes: 0 10*3/uL (ref 0.0–0.1)
Basophils Absolute: 0 10*3/uL (ref 0.0–0.1)
Basophils Relative: 0 %
EOS ABS: 0.2 10*3/uL (ref 0.0–0.7)
EOS PCT: 2 %
IMMATURE GRANULOCYTES: 0 %
LYMPHS ABS: 2.1 10*3/uL (ref 0.7–4.0)
Lymphocytes Relative: 31 %
Monocytes Absolute: 0.6 10*3/uL (ref 0.1–1.0)
Monocytes Relative: 9 %
Neutro Abs: 3.9 10*3/uL (ref 1.7–7.7)
Neutrophils Relative %: 58 %

## 2018-07-14 LAB — APTT: APTT: 35 s (ref 24–36)

## 2018-07-14 LAB — PROTIME-INR
INR: 1.09
PROTHROMBIN TIME: 14 s (ref 11.4–15.2)

## 2018-07-14 NOTE — ED Triage Notes (Signed)
Pt presents with continued symptoms of weakness and inability to ambulate, now having urinary and fecal incontinence, non-verbal/ slurred speech, decreased appetite, no longer smoking where he was a "chain smoker"; cellulitis noted to R leg; pt with hx of dementia Per care coordinator at bedside patient now having more pronounced L sided weakness with drift, L facial droop since yesterday at 3p

## 2018-07-14 NOTE — ED Provider Notes (Signed)
[facility] EMERGENCY DEPARTMENT Provider Note   CSN: [id number] Arrival date & time: 07/14/18  1700     History   Chief Complaint Chief Complaint  Patient presents with  . Weakness  . Aphasia  . Gait Problem  . Urinary Incontinence    HPI Ronald Matthews is a 84 y.o. male.  Patient brought to the emergency department over concern of difficulty with speech and left-sided weakness.  Patient is brought here by POV by the care manager of the facility after having been to [facility] twice this week with similar symptoms.  Patient has had blood work, CTs, no acute findings and sent back to the facility.  Upon arrival to the ER, patient states that he has noticed that the whole left side of his body is numb and weak.  He reports that it is not working like the other side.  He has difficulty doing anything with his left arm and he notices that his left leg is much weaker than the right.     Past Medical History:  Diagnosis Date  . Coronary artery disease   . Dementia   . Hypertension   . Thrombocytopenia (HCC)     There are no active problems to display for this patient.   History reviewed. No pertinent surgical history.      Home Medications    Prior to Admission medications   Medication Sig Start Date End Date Taking? Authorizing Provider  brimonidine (ALPHAGAN) 0.2 % ophthalmic solution Place 2 drops into both eyes 2 (two) times daily.   Yes [provider]  isosorbide mononitrate (IMDUR) 30 MG 24 hr tablet Take 15 mg by mouth daily.   Yes [provider]  metoprolol succinate (TOPROL-XL) 25 MG 24 hr tablet Take 12.5 mg by mouth daily.   Yes [provider]  Multiple Vitamin (THEREMS) TABS Take 1 tablet by mouth daily.   Yes [provider]  nitroGLYCERIN (NITROSTAT) 0.4 MG SL tablet Place 0.4 mg under the tongue every 5 (five) minutes x 3 doses as needed for chest pain (AND CALL EMS IF NO RESOLUTION).   Yes  [provider]  travoprost, benzalkonium, (TRAVATAN) 0.004 % ophthalmic solution Place 1 drop into both eyes at bedtime.   Yes [provider]  white petrolatum (VASELINE) GEL Apply 1 application topically as needed (for dry areas on body).   Yes [provider]  clobetasol cream (TEMOVATE) 0.05 % Apply 1 application topically 2 (two) times daily. Patient not taking: Reported on 07/15/2018 12/02/15   Sharyn Creamer, MD    Family History History reviewed. No pertinent family history.  Social History Social History   Tobacco Use  . Smoking status: Current Every Day Smoker  . Smokeless tobacco: Never Used  Substance Use Topics  . Alcohol use: No  . Drug use: No     Allergies   Ppd [tuberculin purified protein derivative] and Tuberculin tests   Review of Systems Review of Systems  Neurological: Positive for speech difficulty, weakness and numbness.  All other systems reviewed and are negative.    Physical Exam Updated Vital Signs BP (!) 150/62   Pulse (!) 57   Temp 98.5 F (36.9 C) (Oral)   Resp 14   SpO2 97%   Physical Exam  Constitutional: He is oriented to person, place, and time. He appears well-developed and well-nourished. No distress.  HENT:  Head: Normocephalic and atraumatic.  Right Ear: Hearing normal.  Left Ear: Hearing normal.  Nose: Nose normal.  Mouth/Throat: Oropharynx is clear and moist and mucous membranes are normal.  Eyes: Pupils are equal, round, and reactive to light. Conjunctivae and EOM are normal.  Neck: Normal range of motion. Neck supple.  Cardiovascular: Regular rhythm, S1 normal and S2 normal. Exam reveals no gallop and no friction rub.  No murmur heard. Pulmonary/Chest: Effort normal and breath sounds normal. No respiratory distress. He exhibits no tenderness.  Abdominal: Soft. Normal appearance and bowel sounds are normal. There is no hepatosplenomegaly. There is no tenderness. There is no rebound, no guarding, no  tenderness at McBurney's point and negative Murphy's sign. No hernia.  Musculoskeletal: Normal range of motion.  Neurological: He is alert and oriented to person, place, and time. A cranial nerve deficit and sensory deficit is present. He exhibits abnormal muscle tone. Coordination normal. GCS eye subscore is 4. GCS verbal subscore is 5. GCS motor subscore is 6.  Left hemiparesis with slurring of speech secondary to facial droop  Skin: Skin is warm, dry and intact. No rash noted. No cyanosis.  Psychiatric: He has a normal mood and affect. His speech is normal and behavior is normal. Thought content normal.  Nursing note and vitals reviewed.    ED Treatments / Results  Labs (all labs ordered are listed, but only abnormal results are displayed) Labs Reviewed  CBC - Abnormal; Notable for the following components:      Result Value   Hemoglobin 12.5 (*)    All other components within normal limits  COMPREHENSIVE METABOLIC PANEL - Abnormal; Notable for the following components:   Glucose, Bld 113 (*)    All other components within normal limits  I-STAT CHEM 8, ED - Abnormal; Notable for the following components:   Glucose, Bld 108 (*)    All other components within normal limits  PROTIME-INR  APTT  DIFFERENTIAL  I-STAT TROPONIN, ED    EKG None  Radiology Ct Head Wo Contrast  Result Date: 07/14/2018 CLINICAL DATA:  Weakness and inability to ambulate. Urinary and fecal incontinence. EXAM: CT HEAD WITHOUT CONTRAST TECHNIQUE: Contiguous axial images were obtained from the base of the skull through the vertex without intravenous contrast. COMPARISON:  07/13/2018 FINDINGS: Brain: There is no evidence for acute hemorrhage, hydrocephalus, mass lesion, or abnormal extra-axial fluid collection. No definite CT evidence for acute infarction. Patchy low attenuation in the deep hemispheric and periventricular white matter is nonspecific, but likely reflects chronic microvascular ischemic  demyelination. Diffuse loss of parenchymal volume is consistent with atrophy. Prominence of the lateral ventricles is associated with sparing of the temporal tips suggesting ventral megaly related to atrophy. Vascular: No hyperdense vessel or unexpected calcification. Skull: No evidence for fracture. No worrisome lytic or sclerotic lesion. Sinuses/Orbits: The visualized paranasal sinuses and mastoid air cells are clear. Visualized portions of the globes and intraorbital fat are unremarkable. Other: None. IMPRESSION: 1. Stable exam.  No acute intracranial abnormality. 2. Atrophy with chronic small vessel white matter ischemic disease. Electronically Signed   By: Kennith Center M.D.   On: 07/14/2018 20:29   Ct Head Wo Contrast  Result Date: 07/13/2018 CLINICAL DATA:  Unwitnessed fall EXAM: CT HEAD WITHOUT CONTRAST CT CERVICAL SPINE WITHOUT CONTRAST TECHNIQUE: Multidetector CT imaging of the head and cervical spine was performed following the standard protocol without intravenous contrast. Multiplanar CT image reconstructions of the cervical spine were also generated. COMPARISON:  07/11/2018 head CT FINDINGS: CT HEAD FINDINGS Brain: No acute territorial infarction, hemorrhage or intracranial mass. Moderate small vessel ischemic  changes of the white matter. Probable chronic lacunar infarcts within the bilateral basal ganglia. The ventricles are slightly enlarged, likely due to atrophy. Vascular: No hyperdense vessels. Vertebral and carotid vascular calcification Skull: Normal. Negative for fracture or focal lesion. Sinuses/Orbits: No acute finding. Other: None CT CERVICAL SPINE FINDINGS Alignment: Mild reversal of cervical lordosis. No subluxation. Facet alignment within normal limits. Skull base and vertebrae: No acute fracture. No primary bone lesion or focal pathologic process. Soft tissues and spinal canal: No prevertebral fluid or swelling. No visible canal hematoma. Disc levels: Moderate degenerative changes at  C3-C4, C5-C6 and C6-C7 with marked degenerative change at C4-C5. Multiple level facet degenerative changes. Multiple level foraminal narrowing, most significant at C4-C5. Upper chest: Negative. Other: None IMPRESSION: 1. No CT evidence for acute intracranial abnormality. Small-vessel ischemic changes of the white matter and mild atrophy. 2. Reversal of cervical lordosis with degenerative changes. No acute fracture is seen. Electronically Signed   By: Jasmine PangKim  Fujinaga M.D.   On: 07/13/2018 18:59   Ct Cervical Spine Wo Contrast  Result Date: 07/13/2018 CLINICAL DATA:  Unwitnessed fall EXAM: CT HEAD WITHOUT CONTRAST CT CERVICAL SPINE WITHOUT CONTRAST TECHNIQUE: Multidetector CT imaging of the head and cervical spine was performed following the standard protocol without intravenous contrast. Multiplanar CT image reconstructions of the cervical spine were also generated. COMPARISON:  07/11/2018 head CT FINDINGS: CT HEAD FINDINGS Brain: No acute territorial infarction, hemorrhage or intracranial mass. Moderate small vessel ischemic changes of the white matter. Probable chronic lacunar infarcts within the bilateral basal ganglia. The ventricles are slightly enlarged, likely due to atrophy. Vascular: No hyperdense vessels. Vertebral and carotid vascular calcification Skull: Normal. Negative for fracture or focal lesion. Sinuses/Orbits: No acute finding. Other: None CT CERVICAL SPINE FINDINGS Alignment: Mild reversal of cervical lordosis. No subluxation. Facet alignment within normal limits. Skull base and vertebrae: No acute fracture. No primary bone lesion or focal pathologic process. Soft tissues and spinal canal: No prevertebral fluid or swelling. No visible canal hematoma. Disc levels: Moderate degenerative changes at C3-C4, C5-C6 and C6-C7 with marked degenerative change at C4-C5. Multiple level facet degenerative changes. Multiple level foraminal narrowing, most significant at C4-C5. Upper chest: Negative. Other:  None IMPRESSION: 1. No CT evidence for acute intracranial abnormality. Small-vessel ischemic changes of the white matter and mild atrophy. 2. Reversal of cervical lordosis with degenerative changes. No acute fracture is seen. Electronically Signed   By: Jasmine PangKim  Fujinaga M.D.   On: 07/13/2018 18:59   Mr Brain Wo Contrast  Result Date: 07/15/2018 CLINICAL DATA:  84 y/o M; increased left-sided weakness with trip and left facial droop. EXAM: MRI HEAD WITHOUT CONTRAST TECHNIQUE: Multiplanar, multiecho pulse sequences of the brain and surrounding structures were obtained without intravenous contrast. COMPARISON:  07/14/2018 CT head FINDINGS: Brain: Right paramedian pontine reduced diffusion and T2 FLAIR hyperintensity measuring 18 x 12 mm (AP by ML series 5, image 48) compatible with late acute/early subacute infarction. No associated hemorrhage or mass effect. Stable advanced chronic microvascular ischemic changes and volume loss of the brain. No hydrocephalus, extra-axial collection, effacement of basilar cisterns, focal mass effect, or herniation. Small chronic lacunar infarcts are present within the thalami and lentiform nuclei. Vascular: Normal flow voids. Skull and upper cervical spine: Normal marrow signal. Sinuses/Orbits: Negative. Other: None. IMPRESSION: 1. 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 No associated hemorrhage or mass effect. 2. Stable background of advanced chronic microvascular ischemic changes and volume loss of the brain. These results will be called to the  ordering clinician or representative by the Radiologist Assistant, and communication documented in the PACS or zVision Dashboard. Electronically Signed   By: Mitzi Hansen M.D.   On: 07/15/2018 02:48    Procedures Procedures (including critical care time)  Medications Ordered in ED Medications - No data to display   Initial Impression / Assessment and Plan / ED Course  I have reviewed the triage  vital signs and the nursing notes.  Pertinent labs & imaging results that were available during my care of the patient were reviewed by me and considered in my medical decision making (see chart for details).     She presents to the emergency department for further evaluation of left-sided weakness.  Symptoms appear to have been ongoing for a couple of days.  Although he did have evidence of focal deficit, he was not a candidate for TPA because of timing.  Patient has had CT head prior to evaluation in the ER today.  This did not show any acute abnormality.  His symptoms seem to have worsened today.  He underwent protocol stroke work-up through triage.  CT did not show acute abnormality, but examination was very consistent with CVA.  An MRI was ordered and does confirm.  Discussed briefly with neurology who will consult on the patient.  Will admit to internal medicine.  Final Clinical Impressions(s) / ED Diagnoses   Final diagnoses:  Acute embolic stroke [facility])    ED Discharge Orders    None       Gilda Crease, MD 07/15/18 [phone]

## 2018-07-14 NOTE — ED Notes (Signed)
Condom cath placed on pt 

## 2018-07-15 ENCOUNTER — Other Ambulatory Visit: Payer: Self-pay

## 2018-07-15 ENCOUNTER — Encounter (HOSPITAL_COMMUNITY): Payer: Self-pay | Admitting: Family Medicine

## 2018-07-15 ENCOUNTER — Emergency Department (HOSPITAL_COMMUNITY): Payer: Medicare Other

## 2018-07-15 ENCOUNTER — Observation Stay (HOSPITAL_BASED_OUTPATIENT_CLINIC_OR_DEPARTMENT_OTHER): Payer: Medicare Other

## 2018-07-15 ENCOUNTER — Observation Stay (HOSPITAL_COMMUNITY): Payer: Medicare Other

## 2018-07-15 DIAGNOSIS — I5030 Unspecified diastolic (congestive) heart failure: Secondary | ICD-10-CM | POA: Diagnosis not present

## 2018-07-15 DIAGNOSIS — R32 Unspecified urinary incontinence: Secondary | ICD-10-CM | POA: Diagnosis present

## 2018-07-15 DIAGNOSIS — F17200 Nicotine dependence, unspecified, uncomplicated: Secondary | ICD-10-CM | POA: Diagnosis present

## 2018-07-15 DIAGNOSIS — I639 Cerebral infarction, unspecified: Secondary | ICD-10-CM | POA: Diagnosis not present

## 2018-07-15 DIAGNOSIS — I739 Peripheral vascular disease, unspecified: Secondary | ICD-10-CM | POA: Diagnosis present

## 2018-07-15 DIAGNOSIS — R471 Dysarthria and anarthria: Secondary | ICD-10-CM | POA: Diagnosis present

## 2018-07-15 DIAGNOSIS — R4701 Aphasia: Secondary | ICD-10-CM | POA: Diagnosis present

## 2018-07-15 DIAGNOSIS — R29810 Facial weakness: Secondary | ICD-10-CM | POA: Diagnosis present

## 2018-07-15 DIAGNOSIS — Z7982 Long term (current) use of aspirin: Secondary | ICD-10-CM | POA: Diagnosis not present

## 2018-07-15 DIAGNOSIS — D696 Thrombocytopenia, unspecified: Secondary | ICD-10-CM | POA: Diagnosis present

## 2018-07-15 DIAGNOSIS — F0150 Vascular dementia without behavioral disturbance: Secondary | ICD-10-CM | POA: Diagnosis present

## 2018-07-15 DIAGNOSIS — G8194 Hemiplegia, unspecified affecting left nondominant side: Secondary | ICD-10-CM | POA: Diagnosis present

## 2018-07-15 DIAGNOSIS — I10 Essential (primary) hypertension: Secondary | ICD-10-CM | POA: Diagnosis present

## 2018-07-15 DIAGNOSIS — I2510 Atherosclerotic heart disease of native coronary artery without angina pectoris: Secondary | ICD-10-CM | POA: Diagnosis not present

## 2018-07-15 DIAGNOSIS — I63219 Cerebral infarction due to unspecified occlusion or stenosis of unspecified vertebral arteries: Secondary | ICD-10-CM | POA: Diagnosis present

## 2018-07-15 DIAGNOSIS — R1310 Dysphagia, unspecified: Secondary | ICD-10-CM | POA: Diagnosis present

## 2018-07-15 DIAGNOSIS — E785 Hyperlipidemia, unspecified: Secondary | ICD-10-CM | POA: Diagnosis present

## 2018-07-15 DIAGNOSIS — I455 Other specified heart block: Secondary | ICD-10-CM | POA: Diagnosis present

## 2018-07-15 DIAGNOSIS — Z888 Allergy status to other drugs, medicaments and biological substances status: Secondary | ICD-10-CM | POA: Diagnosis not present

## 2018-07-15 DIAGNOSIS — F0390 Unspecified dementia without behavioral disturbance: Secondary | ICD-10-CM | POA: Diagnosis present

## 2018-07-15 LAB — HEMOGLOBIN A1C
Hgb A1c MFr Bld: 6 % — ABNORMAL HIGH (ref 4.8–5.6)
MEAN PLASMA GLUCOSE: 125.5 mg/dL

## 2018-07-15 LAB — LIPID PANEL
CHOLESTEROL: 185 mg/dL (ref 0–200)
HDL: 40 mg/dL — ABNORMAL LOW (ref >40)
LDL Cholesterol: 129 mg/dL — ABNORMAL HIGH (ref 0–99)
TRIGLYCERIDES: 80 mg/dL (ref <150)
Total CHOL/HDL Ratio: 4.6 RATIO
VLDL: 16 mg/dL (ref 0–40)

## 2018-07-15 LAB — ECHOCARDIOGRAM COMPLETE

## 2018-07-15 MED ORDER — GADOBUTROL 1 MMOL/ML IV SOLN
9.0000 mL | Freq: Once | INTRAVENOUS | Status: AC | PRN
  Administered 2018-07-15: 9 mL via INTRAVENOUS

## 2018-07-15 MED ORDER — HALOPERIDOL LACTATE 5 MG/ML IJ SOLN
2.0000 mg | Freq: Four times a day (QID) | INTRAMUSCULAR | Status: DC | PRN
  Administered 2018-07-15 – 2018-07-18 (×3): 2 mg via INTRAVENOUS
  Filled 2018-07-15 (×3): qty 1

## 2018-07-15 MED ORDER — ASPIRIN 300 MG RE SUPP: 300.0000 mg | Freq: Every day | RECTAL | Status: DC

## 2018-07-15 MED ORDER — ASPIRIN EC 81 MG PO TBEC
81.0000 mg | DELAYED_RELEASE_TABLET | Freq: Every day | ORAL | Status: DC
  Administered 2018-07-15 – 2018-07-18 (×4): 81 mg via ORAL
  Filled 2018-07-15 (×4): qty 1

## 2018-07-15 MED ORDER — ACETAMINOPHEN 650 MG RE SUPP: 650.0000 mg | RECTAL | Status: DC | PRN

## 2018-07-15 MED ORDER — ADULT MULTIVITAMIN W/MINERALS CH
1.0000 | ORAL_TABLET | Freq: Every day | ORAL | Status: DC
  Administered 2018-07-15 – 2018-07-18 (×4): 1 via ORAL
  Filled 2018-07-15 (×4): qty 1

## 2018-07-15 MED ORDER — METOPROLOL SUCCINATE ER 25 MG PO TB24
25.0000 mg | ORAL_TABLET | Freq: Every day | ORAL | Status: DC
  Administered 2018-07-15 – 2018-07-16 (×2): 25 mg via ORAL
  Filled 2018-07-15 (×2): qty 1

## 2018-07-15 MED ORDER — ACETAMINOPHEN 325 MG PO TABS: 650.0000 mg | ORAL_TABLET | ORAL | Status: DC | PRN

## 2018-07-15 MED ORDER — SENNOSIDES-DOCUSATE SODIUM 8.6-50 MG PO TABS: 1.0000 | ORAL_TABLET | Freq: Every evening | ORAL | Status: DC | PRN

## 2018-07-15 MED ORDER — ENOXAPARIN SODIUM 40 MG/0.4ML ~~LOC~~ SOLN
40.0000 mg | Freq: Every day | SUBCUTANEOUS | Status: DC
  Administered 2018-07-15 – 2018-07-18 (×4): 40 mg via SUBCUTANEOUS
  Filled 2018-07-15 (×4): qty 0.4

## 2018-07-15 MED ORDER — SODIUM CHLORIDE 0.9 % IV SOLN
INTRAVENOUS | Status: DC
  Administered 2018-07-15: 15:00:00 via INTRAVENOUS
  Administered 2018-07-15: 1000 mL via INTRAVENOUS
  Administered 2018-07-17: 17:00:00 via INTRAVENOUS

## 2018-07-15 MED ORDER — ASPIRIN 325 MG PO TABS: 325.0000 mg | ORAL_TABLET | Freq: Every day | ORAL | Status: DC

## 2018-07-15 MED ORDER — SODIUM CHLORIDE 0.9 % IV SOLN
INTRAVENOUS | Status: AC
  Administered 2018-07-15: 12:00:00 via INTRAVENOUS

## 2018-07-15 MED ORDER — ISOSORBIDE MONONITRATE ER 30 MG PO TB24
15.0000 mg | ORAL_TABLET | Freq: Every day | ORAL | Status: DC
  Administered 2018-07-15 – 2018-07-18 (×4): 15 mg via ORAL
  Filled 2018-07-15 (×4): qty 1

## 2018-07-15 MED ORDER — BRIMONIDINE TARTRATE 0.2 % OP SOLN
2.0000 [drp] | Freq: Two times a day (BID) | OPHTHALMIC | Status: DC
  Administered 2018-07-15 – 2018-07-18 (×7): 2 [drp] via OPHTHALMIC
  Filled 2018-07-15: qty 5

## 2018-07-15 MED ORDER — TRAVOPROST (BAK FREE) 0.004 % OP SOLN
1.0000 [drp] | Freq: Every day | OPHTHALMIC | Status: DC
  Administered 2018-07-16 – 2018-07-17 (×3): 1 [drp] via OPHTHALMIC
  Filled 2018-07-15: qty 2.5

## 2018-07-15 MED ORDER — STROKE: EARLY STAGES OF RECOVERY BOOK
Freq: Once | Status: DC
  Filled 2018-07-15: qty 1

## 2018-07-15 MED ORDER — ACETAMINOPHEN 160 MG/5ML PO SOLN: 650.0000 mg | ORAL | Status: DC | PRN

## 2018-07-15 NOTE — ED Notes (Signed)
Admitting MD - Dr. Randol KernElgergawy paged to [facility] RN @ [phone].

## 2018-07-15 NOTE — Evaluation (Signed)
Clinical/Bedside Swallow Evaluation Patient Details  Name: Ronald Matthews MRN: [id number] Date of Birth: 1933-08-25  Today's Date: 07/15/2018 Time: SLP Start Time (ACUTE ONLY): 1602 SLP Stop Time (ACUTE ONLY): 1619 SLP Time Calculation (min) (ACUTE ONLY): 17 min  Past Medical History:  Past Medical History:  Diagnosis Date  . Coronary artery disease   . Dementia   . Hypertension   . Thrombocytopenia (HCC)    Past Surgical History: History reviewed. No pertinent surgical history. HPI:  Pt is an 84 y/o male admitted secondary to L sided weakness, dysarthria, and falls. Imaging revealed R paramedian pontine infarct. PMH includes dementia, CAD, and HTN. NO CXR performed as of yet. No prior ST.   Assessment / Plan / Recommendation Clinical Impression  Pt did not exhibit overt s/s aspiration during consumption of 3 oz consecutive sips water although he is at higher risk given pontine location of stroke. Oral containment, manipulation of boluses including mastication of regular texture was within functional limits. Swallow is audible which could  be a normal finding or could be suggestive of anatomical difference, head positioning. Given minimal dentition, recommend Dys 3 texture, thin liquids, meds whole in puree and ST will follow for safety and efficiency.    SLP Visit Diagnosis: Dysphagia, unspecified (R13.10)    Aspiration Risk  Mild aspiration risk;Moderate aspiration risk    Diet Recommendation Dysphagia 3 (Mech soft);Thin liquid   Liquid Administration via: Cup;Straw Medication Administration: Whole meds with puree Supervision: Patient able to self feed;Intermittent supervision to cue for compensatory strategies Compensations: Slow rate;Small sips/bites Postural Changes: Seated upright at 90 degrees    Other  Recommendations Oral Care Recommendations: Oral care BID   Follow up Recommendations None      Frequency and Duration min 1 x/week  2 weeks       Prognosis Prognosis  for Safe Diet Advancement: (fair-good) Barriers to Reach Goals: Cognitive deficits      Swallow Study   General HPI: Pt is an 84 y/o male admitted secondary to L sided weakness, dysarthria, and falls. Imaging revealed R paramedian pontine infarct. PMH includes dementia, CAD, and HTN. NO CXR performed as of yet. No prior ST. Type of Study: Bedside Swallow Evaluation Previous Swallow Assessment: (none found) Diet Prior to this Study: NPO Temperature Spikes Noted: No Respiratory Status: Room air History of Recent Intubation: No Behavior/Cognition: Alert;Cooperative;Pleasant mood;Requires cueing Oral Cavity Assessment: Other (comment)(?lingual candidias) Oral Care Completed by SLP: No Oral Cavity - Dentition: Poor condition;Missing dentition Vision: Functional for self-feeding Self-Feeding Abilities: Able to feed self Patient Positioning: Upright in bed Baseline Vocal Quality: Normal Volitional Cough: Strong Volitional Swallow: Able to elicit    Oral/Motor/Sensory Function Overall Oral Motor/Sensory Function: Mild impairment Facial ROM: Within Functional Limits Facial Symmetry: Within Functional Limits Facial Strength: Within Functional Limits Lingual ROM: Within Functional Limits Lingual Symmetry: Abnormal symmetry right   Ice Chips Ice chips: Not tested   Thin Liquid Thin Liquid: Within functional limits Presentation: Cup;Straw    Nectar Thick Nectar Thick Liquid: Not tested   Honey Thick Honey Thick Liquid: Not tested   Puree Puree: Within functional limits   Solid     Solid: Within functional limits      Royce MacadamiaLitaker, Zykiria Bruening Willis 07/15/2018,4:36 PM  Breck CoonsLisa Willis Lonell FaceLitaker M.Ed Nurse, children'sCCC-SLP Speech-Language Pathologist Pager [phone] Office [phone]

## 2018-07-15 NOTE — ED Notes (Signed)
Patient transported to MRI 

## 2018-07-15 NOTE — ED Notes (Signed)
[place] call to pt son, allowed pt to speak with son, Luisa Hartatrick [phone]

## 2018-07-15 NOTE — H&P (Signed)
History and Physical    Ronald Matthews AVW:[id number] DOB: Nov 28, 1932 DOA: 07/14/2018  PCP: Shelbie Ammons, MD   Patient coming from: Memory care unit at [facility]  Chief Complaint: Left-sided weakness, dysarthria, falls  HPI: Ronald Matthews is a 84 y.o. male with medical history significant for dementia, coronary artery disease, and hypertension, now presenting to the emergency department for evaluation of left-sided weakness, dysarthria, and falls.  Patient is accompanied by his care coordinator from his nursing facility who assists with the history.  He has reportedly been generally weak with poor appetite in recent weeks, has had some recent falls, was evaluated in another emergency department on 07/11/2018 with nonacute head CT.  On 07/13/2018, he was noted to have new left-sided weakness and dysarthria.  He was taken back to the ED at that time, had another head CT and cervical spine CT which were negative for acute findings, and he returned to the nursing home where he has continued to have left-sided weakness and dysarthria.  Patient denies headache, chest pain, palpitations, fevers, or chills.  With his persistent left-sided weakness, he was brought back to the ED tonight for reevaluation.  ED Course: Upon arrival to the ED, patient is found to be afebrile, saturating well on room air, and with vitals otherwise stable.  EKG features a sinus rhythm with first-degree AV nodal block.  Noncontrast head CT was negative for acute findings.  Chemistry panel and CBC unremarkable, INR and troponin normal.  MRI brain was performed and concerning for right paramedian pontine late acute/early subacute infarction without hemorrhage or mass-effect.  Neurology was consulted by the ED physician and recommended medical admission for further evaluation and management.  Review of Systems:  All other systems reviewed and apart from HPI, are negative.  Past Medical History:  Diagnosis Date  . Coronary artery  disease   . Dementia   . Hypertension   . Thrombocytopenia (HCC)     History reviewed. No pertinent surgical history.   reports that he has been smoking. He has never used smokeless tobacco. He reports that he does not drink alcohol or use drugs.  Allergies  Allergen Reactions  . Ppd [Tuberculin Purified Protein Derivative] Other (See Comments)    "Allergic," per MAR  . Tuberculin Tests Other (See Comments)    "Allergic," per [facility]    History reviewed. No pertinent family history.   Prior to Admission medications   Medication Sig Start Date End Date Taking? Authorizing Provider  brimonidine (ALPHAGAN) 0.2 % ophthalmic solution Place 2 drops into both eyes 2 (two) times daily.   Yes [provider]  isosorbide mononitrate (IMDUR) 30 MG 24 hr tablet Take 15 mg by mouth daily.   Yes [provider]  metoprolol succinate (TOPROL-XL) 25 MG 24 hr tablet Take 12.5 mg by mouth daily.   Yes [provider]  Multiple Vitamin (THEREMS) TABS Take 1 tablet by mouth daily.   Yes [provider]  nitroGLYCERIN (NITROSTAT) 0.4 MG SL tablet Place 0.4 mg under the tongue every 5 (five) minutes x 3 doses as needed for chest pain (AND CALL EMS IF NO RESOLUTION).   Yes [provider]  travoprost, benzalkonium, (TRAVATAN) 0.004 % ophthalmic solution Place 1 drop into both eyes at bedtime.   Yes [provider]  white petrolatum (VASELINE) GEL Apply 1 application topically as needed (for dry areas on body).   Yes [provider]    Physical Exam: Vitals:   07/15/18 0130 07/15/18 0145  07/15/18 0200 07/15/18 0412  BP: (!) 143/64 (!) 141/69 (!) 150/62 (!) 146/72  Pulse: (!) 58 62 (!) 57 (!) 57  Resp: 14 15 14 16   Temp:      TempSrc:      SpO2: 97% 97% 97% 100%     Constitutional: NAD, calm, frail-appearing Eyes: PERTLA, lids and conjunctivae normal ENMT: Mucous membranes are moist. Posterior pharynx clear of any exudate or lesions.     Neck: normal, supple, no masses, no thyromegaly Respiratory: clear to auscultation bilaterally, no wheezing, no crackles. Normal respiratory effort.   Cardiovascular: S1 & S2 heard, regular rate and rhythm.   Abdomen: No distension, no tenderness, soft. Bowel sounds normal.  Musculoskeletal: no clubbing / cyanosis. No joint deformity upper and lower extremities.   Skin: no significant rashes, lesions, ulcers. Warm, dry, well-perfused. Neurologic: No facial asymmetry. Dysarthria. Sensation to light touch diminished in LUE and LLE. Strength 5/5 on right, 4/5 in LUE, 3-4/5 in LLE.  Psychiatric: Alert and oriented to person and place only. Calm, cooperative.    Labs on Admission: I have personally reviewed following labs and imaging studies  CBC: Recent Labs  Lab 07/11/18 0913 07/14/18 1734 07/14/18 1814  WBC 4.6 6.7  --   NEUTROABS  --  3.9  --   HGB 13.1 12.5* 13.6  HCT 39.3* 39.9 40.0  MCV 86.0 88.9  --   PLT 187 201  --    Basic Metabolic Panel: Recent Labs  Lab 07/11/18 0913 07/14/18 1734 07/14/18 1814  NA 140 142 143  K 4.3 4.5 4.6  CL 107 109 108  CO2 26 24  --   GLUCOSE 107* 113* 108*  BUN 20 17 19   CREATININE 0.91 1.03 1.00  CALCIUM 8.9 9.3  --    GFR: Estimated Creatinine Clearance: 66.5 mL/min (by C-G formula based on SCr of 1 mg/dL). Liver Function Tests: Recent Labs  Lab 07/11/18 0913 07/14/18 1734  AST 20 29  ALT 12 15  ALKPHOS 51 56  BILITOT 0.7 0.5  PROT 7.0 6.6  ALBUMIN 4.0 4.0   No results for input(s): LIPASE, AMYLASE in the last 168 hours. No results for input(s): AMMONIA in the last 168 hours. Coagulation Profile: Recent Labs  Lab 07/14/18 1734  INR 1.09   Cardiac Enzymes: Recent Labs  Lab 07/11/18 0913  TROPONINI <0.03   BNP (last 3 results) No results for input(s): PROBNP in the last 8760 hours. HbA1C: No results for input(s): HGBA1C in the last 72 hours. CBG: No results for input(s): GLUCAP in the last 168 hours. Lipid  Profile: No results for input(s): CHOL, HDL, LDLCALC, TRIG, CHOLHDL, LDLDIRECT in the last 72 hours. Thyroid Function Tests: No results for input(s): TSH, T4TOTAL, FREET4, T3FREE, THYROIDAB in the last 72 hours. Anemia Panel: No results for input(s): VITAMINB12, FOLATE, FERRITIN, TIBC, IRON, RETICCTPCT in the last 72 hours. Urine analysis:    Component Value Date/Time   COLORURINE YELLOW (A) 07/11/2018 0913   APPEARANCEUR HAZY (A) 07/11/2018 0913   LABSPEC 1.019 07/11/2018 0913   PHURINE 6.0 07/11/2018 0913   GLUCOSEU NEGATIVE 07/11/2018 0913   HGBUR SMALL (A) 07/11/2018 0913   BILIRUBINUR NEGATIVE 07/11/2018 0913   KETONESUR 5 (A) 07/11/2018 0913   PROTEINUR NEGATIVE 07/11/2018 0913   NITRITE NEGATIVE 07/11/2018 0913   LEUKOCYTESUR NEGATIVE 07/11/2018 0913   Sepsis Labs: @LABRCNTIP (procalcitonin:4,lacticidven:4) ) Recent Results (from the past 240 hour(s))  Urine Culture     Status: Abnormal   Collection Time: 07/11/18  9:13 AM  Result Value Ref Range Status   Specimen Description   Final    URINE, RANDOM Performed at [facility], [address] [place], [place] [address]    Special Requests   Final    NONE Performed at [facility], [address] [place], [place] [address]    Culture MULTIPLE SPECIES PRESENT, SUGGEST RECOLLECTION (A)  Final   Report Status 07/12/2018 FINAL  Final     Radiological Exams on Admission: Ct Head Wo Contrast  Result Date: 07/14/2018 CLINICAL DATA:  Weakness and inability to ambulate. Urinary and fecal incontinence. EXAM: CT HEAD WITHOUT CONTRAST TECHNIQUE: Contiguous axial images were obtained from the base of the skull through the vertex without intravenous contrast. COMPARISON:  07/13/2018 FINDINGS: Brain: There is no evidence for acute hemorrhage, hydrocephalus, mass lesion, or abnormal extra-axial fluid collection. No definite CT evidence for acute infarction. Patchy low attenuation in the deep hemispheric and  periventricular white matter is nonspecific, but likely reflects chronic microvascular ischemic demyelination. Diffuse loss of parenchymal volume is consistent with atrophy. Prominence of the lateral ventricles is associated with sparing of the temporal tips suggesting ventral megaly related to atrophy. Vascular: No hyperdense vessel or unexpected calcification. Skull: No evidence for fracture. No worrisome lytic or sclerotic lesion. Sinuses/Orbits: The visualized paranasal sinuses and mastoid air cells are clear. Visualized portions of the globes and intraorbital fat are unremarkable. Other: None. IMPRESSION: 1. Stable exam.  No acute intracranial abnormality. 2. Atrophy with chronic small vessel white matter ischemic disease. Electronically Signed   By: Kennith CenterEric  Mansell M.D.   On: 07/14/2018 20:29   Ct Head Wo Contrast  Result Date: 07/13/2018 CLINICAL DATA:  Unwitnessed fall EXAM: CT HEAD WITHOUT CONTRAST CT CERVICAL SPINE WITHOUT CONTRAST TECHNIQUE: Multidetector CT imaging of the head and cervical spine was performed following the standard protocol without intravenous contrast. Multiplanar CT image reconstructions of the cervical spine were also generated. COMPARISON:  07/11/2018 head CT FINDINGS: CT HEAD FINDINGS Brain: No acute territorial infarction, hemorrhage or intracranial mass. Moderate small vessel ischemic changes of the white matter. Probable chronic lacunar infarcts within the bilateral basal ganglia. The ventricles are slightly enlarged, likely due to atrophy. Vascular: No hyperdense vessels. Vertebral and carotid vascular calcification Skull: Normal. Negative for fracture or focal lesion. Sinuses/Orbits: No acute finding. Other: None CT CERVICAL SPINE FINDINGS Alignment: Mild reversal of cervical lordosis. No subluxation. Facet alignment within normal limits. Skull base and vertebrae: No acute fracture. No primary bone lesion or focal pathologic process. Soft tissues and spinal canal: No  prevertebral fluid or swelling. No visible canal hematoma. Disc levels: Moderate degenerative changes at C3-C4, C5-C6 and C6-C7 with marked degenerative change at C4-C5. Multiple level facet degenerative changes. Multiple level foraminal narrowing, most significant at C4-C5. Upper chest: Negative. Other: None IMPRESSION: 1. No CT evidence for acute intracranial abnormality. Small-vessel ischemic changes of the white matter and mild atrophy. 2. Reversal of cervical lordosis with degenerative changes. No acute fracture is seen. Electronically Signed   By: Jasmine PangKim  Fujinaga M.D.   On: 07/13/2018 18:59   Ct Cervical Spine Wo Contrast  Result Date: 07/13/2018 CLINICAL DATA:  Unwitnessed fall EXAM: CT HEAD WITHOUT CONTRAST CT CERVICAL SPINE WITHOUT CONTRAST TECHNIQUE: Multidetector CT imaging of the head and cervical spine was performed following the standard protocol without intravenous contrast. Multiplanar CT image reconstructions of the cervical spine were also generated. COMPARISON:  07/11/2018 head CT FINDINGS: CT HEAD FINDINGS Brain: No acute territorial infarction, hemorrhage or intracranial  mass. Moderate small vessel ischemic changes of the white matter. Probable chronic lacunar infarcts within the bilateral basal ganglia. The ventricles are slightly enlarged, likely due to atrophy. Vascular: No hyperdense vessels. Vertebral and carotid vascular calcification Skull: Normal. Negative for fracture or focal lesion. Sinuses/Orbits: No acute finding. Other: None CT CERVICAL SPINE FINDINGS Alignment: Mild reversal of cervical lordosis. No subluxation. Facet alignment within normal limits. Skull base and vertebrae: No acute fracture. No primary bone lesion or focal pathologic process. Soft tissues and spinal canal: No prevertebral fluid or swelling. No visible canal hematoma. Disc levels: Moderate degenerative changes at C3-C4, C5-C6 and C6-C7 with marked degenerative change at C4-C5. Multiple level facet degenerative  changes. Multiple level foraminal narrowing, most significant at C4-C5. Upper chest: Negative. Other: None IMPRESSION: 1. No CT evidence for acute intracranial abnormality. Small-vessel ischemic changes of the white matter and mild atrophy. 2. Reversal of cervical lordosis with degenerative changes. No acute fracture is seen. Electronically Signed   By: Jasmine Pang M.D.   On: 07/13/2018 18:59   Mr Brain Wo Contrast  Result Date: 07/15/2018 CLINICAL DATA:  84 y/o M; increased left-sided weakness with trip and left facial droop. EXAM: MRI HEAD WITHOUT CONTRAST TECHNIQUE: Multiplanar, multiecho pulse sequences of the brain and surrounding structures were obtained without intravenous contrast. COMPARISON:  07/14/2018 CT head FINDINGS: Brain: Right paramedian pontine reduced diffusion and T2 FLAIR hyperintensity measuring 18 x 12 mm (AP by ML series 5, image 48) compatible with late acute/early subacute infarction. No associated hemorrhage or mass effect. Stable advanced chronic microvascular ischemic changes and volume loss of the brain. No hydrocephalus, extra-axial collection, effacement of basilar cisterns, focal mass effect, or herniation. Small chronic lacunar infarcts are present within the thalami and lentiform nuclei. Vascular: Normal flow voids. Skull and upper cervical spine: Normal marrow signal. Sinuses/Orbits: Negative. Other: None. IMPRESSION: 1. 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 No associated hemorrhage or mass effect. 2. Stable background of advanced chronic microvascular ischemic changes and volume loss of the brain. These results will be called to the ordering clinician or representative by the Radiologist Assistant, and communication documented in the PACS or zVision Dashboard. Electronically Signed   By: Mitzi Hansen M.D.   On: 07/15/2018 02:48    EKG: Independently reviewed. Sinus rhythm, 1st degree AV block.   Assessment/Plan   1. Ischemic  stroke  - Presents with left-sided weakness and dysarthria, present since 07/13/18 per ALF personnel - Head CT negative for acute findings, MRI brain with right paramedian pontine late acute/early subacute CVA without hemorrhage or mass-effect  - Neurology is consulting and much appreciated  - Continue cardiac monitoring, frequent neuro checks, PT/OT/SLP evals, swallow screen  - Check MRA head and neck, echocardiogram, A1c, and lipid panel - Start ASA 81 mg qD   2. Hypertension  - BP is at goal  - Continue Toprol     3. CAD  - No anginal complaints   - Continue Imdur and beta-blocker    DVT prophylaxis: Lovenox Code Status: Full  Family Communication: Discussed with patient  Consults called: Neurology  Admission status: Observation     Briscoe Deutscher, MD [facility] Pager [phone]  If 7PM-7AM, please contact night-coverage [email or web address] Password [email or web address]  07/15/2018, 4:18 AM

## 2018-07-15 NOTE — Consult Note (Signed)
Referring Physician: Dr. Betsey Holiday    Chief Complaint: Subacute right paramedian pontine ischemic infarction  HPI: Ronald Matthews is an 84 y.o. male with a history of vascular dementia who presented to the [facility] on Thursday evening with continued symptoms of left sided weakness and inability to ambulate. Per his care coordinator, symptoms began on Monday at [facility] and worsened on Wednesday. He had been seen three times at [facility] (twice on 9/9 and once on 9/11) for this but had been discharged home each time. CTs had shown no acute findings and blood work had been unremarkable. He came back, this time to [facility], due to new urinary and fecal incontinence with slurred speech.   He feels that the whole left side of his body feels numb and weak, stating "the muscles are not working".   An MRI was obtained, revealing a subacute right paramedian pontine ischemic infarction.   PMHx also includes CAD, HTN and thrombocytopenia.   MRI brain: 1. 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 No associated hemorrhage or mass effect. 2. Stable background of advanced chronic microvascular ischemic changes and volume loss of the brain.   Past Medical History:  Diagnosis Date  . Coronary artery disease   . Dementia   . Hypertension   . Thrombocytopenia ([place])     History reviewed. No pertinent surgical history.  History reviewed. No pertinent family history. Social History:  reports that he has been smoking. He has never used smokeless tobacco. He reports that he does not drink alcohol or use drugs.  Allergies:  Allergies  Allergen Reactions  . Ppd [Tuberculin Purified Protein Derivative] Other (See Comments)    "Allergic," per [facility]  . Tuberculin Tests Other (See Comments)    "Allergic," per [facility]    Home Medications:  Current Meds  Medication Sig  . brimonidine (ALPHAGAN) 0.2 % ophthalmic solution Place 2 drops into both eyes 2 (two) times daily.  . isosorbide  mononitrate (IMDUR) 30 MG 24 hr tablet Take 15 mg by mouth daily.  . metoprolol succinate (TOPROL-XL) 25 MG 24 hr tablet Take 12.5 mg by mouth daily.  . Multiple Vitamin (THEREMS) TABS Take 1 tablet by mouth daily.  . nitroGLYCERIN (NITROSTAT) 0.4 MG SL tablet Place 0.4 mg under the tongue every 5 (five) minutes x 3 doses as needed for chest pain (AND CALL EMS IF NO RESOLUTION).  Marland Kitchen travoprost, benzalkonium, (TRAVATAN) 0.004 % ophthalmic solution Place 1 drop into both eyes at bedtime.  . white petrolatum (VASELINE) GEL Apply 1 application topically as needed (for dry areas on body).    ROS: As per HPI. Unable to obtain detailed ROS due to the patient's dementia.   Physical Examination: Blood pressure 139/72, pulse 62, temperature 98.5 F (36.9 C), temperature source Oral, resp. rate 15, SpO2 99 %.  HEENT: [place]/AT Lungs: Respirations unlabored Ext: No edema  Neurologic Examination: Mental Status: Awake and alert. Pleasant and cooperative. Poverty of thought. Limited speech output is fluent. Able to follow simple commands and name thumb, pinky and pen, but not a forefinger. Oriented to city, state and year, but not day. Speech is slow and at times mildly dysarthric.  Cranial Nerves: II:  Visual fields grossly normal without extinction to DSS. PERRL.  III,IV, VI: EOM are full with prominent saccadic quality with visual pursuits. No nystagmus.  V,VII: Subtle left facial droop. Facial temp sensation equal bilaterally VIII: hearing intact to voice IX,X: No hypophonia XI: Decreased shoulder movement on left XII: midline tongue extension  Motor: RUE 5/5 RLE 5/5 LUE 2/5 deltoid, 3-4/5 triceps, biceps and grip LLE: 4/5 Increased flexor tone to LUE Sensory: Temp and light touch subjectively intact in BUE and BLE. No extinction. Deep Tendon Reflexes:  Right biceps/brachioradialis: 2+ Left biceps/brachioradialis: 3+ Right patella: Trace Left patella 3+ Achilles 0  bilaterally Plantars: Right: Downgoing   Left: Upgoing Cerebellar: No ataxia with FNF on right. Unable to perform on left.  Gait: Unable to assess.   Results for orders placed or performed during the hospital encounter of 07/14/18 (from the past 48 hour(s))  Protime-INR     Status: None   Collection Time: 07/14/18  5:34 PM  Result Value Ref Range   Prothrombin Time 14.0 11.4 - 15.2 seconds   INR 1.09     Comment: Performed at [facility], [place] [address], [place], [place] [address]  APTT     Status: None   Collection Time: 07/14/18  5:34 PM  Result Value Ref Range   aPTT 35 24 - 36 seconds    Comment: Performed at [facility] [address] [place], [place] [address]  CBC     Status: Abnormal   Collection Time: 07/14/18  5:34 PM  Result Value Ref Range   WBC 6.7 4.0 - 10.5 K/uL   RBC 4.49 4.22 - 5.81 MIL/uL   Hemoglobin 12.5 (L) 13.0 - 17.0 g/dL   HCT 39.9 39.0 - 52.0 %   MCV 88.9 78.0 - 100.0 fL   MCH 27.8 26.0 - 34.0 pg   MCHC 31.3 30.0 - 36.0 g/dL   RDW 14.5 11.5 - 15.5 %   Platelets 201 150 - 400 K/uL    Comment: Performed at [facility] [address], [place], [place] [address]  Differential     Status: None   Collection Time: 07/14/18  5:34 PM  Result Value Ref Range   Neutrophils Relative % 58 %   Neutro Abs 3.9 1.7 - 7.7 K/uL   Lymphocytes Relative 31 %   Lymphs Abs 2.1 0.7 - 4.0 K/uL   Monocytes Relative 9 %   Monocytes Absolute 0.6 0.1 - 1.0 K/uL   Eosinophils Relative 2 %   Eosinophils Absolute 0.2 0.0 - 0.7 K/uL   Basophils Relative 0 %   Basophils Absolute 0.0 0.0 - 0.1 K/uL   Immature Granulocytes 0 %   Abs Immature Granulocytes 0.0 0.0 - 0.1 K/uL    Comment: Performed at [facility] [address] [place], [place] [address]  Comprehensive metabolic panel     Status: Abnormal   Collection Time: 07/14/18  5:34 PM  Result Value Ref Range   Sodium 142 135 - 145 mmol/L   Potassium 4.5 3.5 - 5.1 mmol/L   Chloride 109 98 -  111 mmol/L   CO2 24 22 - 32 mmol/L   Glucose, Bld 113 (H) 70 - 99 mg/dL   BUN 17 8 - 23 mg/dL   Creatinine, Ser 1.03 0.61 - 1.24 mg/dL   Calcium 9.3 8.9 - 10.3 mg/dL   Total Protein 6.6 6.5 - 8.1 g/dL   Albumin 4.0 3.5 - 5.0 g/dL   AST 29 15 - 41 U/L   ALT 15 0 - 44 U/L   Alkaline Phosphatase 56 38 - 126 U/L   Total Bilirubin 0.5 0.3 - 1.2 mg/dL   GFR calc non Af Amer >60 >60 mL/min   GFR calc Af Amer >60 >60 mL/min    Comment: (  NOTE) The eGFR has been calculated using the CKD EPI equation. This calculation has not been validated in all clinical situations. eGFR's persistently <60 mL/min signify possible Chronic Kidney Disease.    Anion gap 9 5 - 15    Comment: Performed at [facility] [address], [place], [place] [address]  I-stat troponin, ED     Status: None   Collection Time: 07/14/18  6:12 PM  Result Value Ref Range   Troponin i, poc 0.03 0.00 - 0.08 ng/mL   Comment 3            Comment: Due to the release kinetics of cTnI, a negative result within the first hours of the onset of symptoms does not rule out myocardial infarction with certainty. If myocardial infarction is still suspected, repeat the test at appropriate intervals.   I-Stat Chem 8, ED     Status: Abnormal   Collection Time: 07/14/18  6:14 PM  Result Value Ref Range   Sodium 143 135 - 145 mmol/L   Potassium 4.6 3.5 - 5.1 mmol/L   Chloride 108 98 - 111 mmol/L   BUN 19 8 - 23 mg/dL   Creatinine, Ser 1.00 0.61 - 1.24 mg/dL   Glucose, Bld 108 (H) 70 - 99 mg/dL   Calcium, Ion 1.22 1.15 - 1.40 mmol/L   TCO2 26 22 - 32 mmol/L   Hemoglobin 13.6 13.0 - 17.0 g/dL   HCT 40.0 39.0 - 52.0 %   Ct Head Wo Contrast  Result Date: 07/14/2018 CLINICAL DATA:  Weakness and inability to ambulate. Urinary and fecal incontinence. EXAM: CT HEAD WITHOUT CONTRAST TECHNIQUE: Contiguous axial images were obtained from the base of the skull through the vertex without intravenous contrast. COMPARISON:  07/13/2018  FINDINGS: Brain: There is no evidence for acute hemorrhage, hydrocephalus, mass lesion, or abnormal extra-axial fluid collection. No definite CT evidence for acute infarction. Patchy low attenuation in the deep hemispheric and periventricular white matter is nonspecific, but likely reflects chronic microvascular ischemic demyelination. Diffuse loss of parenchymal volume is consistent with atrophy. Prominence of the lateral ventricles is associated with sparing of the temporal tips suggesting ventral megaly related to atrophy. Vascular: No hyperdense vessel or unexpected calcification. Skull: No evidence for fracture. No worrisome lytic or sclerotic lesion. Sinuses/Orbits: The visualized paranasal sinuses and mastoid air cells are clear. Visualized portions of the globes and intraorbital fat are unremarkable. Other: None. IMPRESSION: 1. Stable exam.  No acute intracranial abnormality. 2. Atrophy with chronic small vessel white matter ischemic disease. Electronically Signed   By: Misty Stanley M.D.   On: 07/14/2018 20:29   Ct Head Wo Contrast  Result Date: 07/13/2018 CLINICAL DATA:  Unwitnessed fall EXAM: CT HEAD WITHOUT CONTRAST CT CERVICAL SPINE WITHOUT CONTRAST TECHNIQUE: Multidetector CT imaging of the head and cervical spine was performed following the standard protocol without intravenous contrast. Multiplanar CT image reconstructions of the cervical spine were also generated. COMPARISON:  07/11/2018 head CT FINDINGS: CT HEAD FINDINGS Brain: No acute territorial infarction, hemorrhage or intracranial mass. Moderate small vessel ischemic changes of the white matter. Probable chronic lacunar infarcts within the bilateral basal ganglia. The ventricles are slightly enlarged, likely due to atrophy. Vascular: No hyperdense vessels. Vertebral and carotid vascular calcification Skull: Normal. Negative for fracture or focal lesion. Sinuses/Orbits: No acute finding. Other: None CT CERVICAL SPINE FINDINGS Alignment:  Mild reversal of cervical lordosis. No subluxation. Facet alignment within normal limits. Skull base and vertebrae: No acute fracture. No primary bone lesion  or focal pathologic process. Soft tissues and spinal canal: No prevertebral fluid or swelling. No visible canal hematoma. Disc levels: Moderate degenerative changes at C3-C4, C5-C6 and C6-C7 with marked degenerative change at C4-C5. Multiple level facet degenerative changes. Multiple level foraminal narrowing, most significant at C4-C5. Upper chest: Negative. Other: None IMPRESSION: 1. No CT evidence for acute intracranial abnormality. Small-vessel ischemic changes of the white matter and mild atrophy. 2. Reversal of cervical lordosis with degenerative changes. No acute fracture is seen. Electronically Signed   By: Donavan Foil M.D.   On: 07/13/2018 18:59   Ct Cervical Spine Wo Contrast  Result Date: 07/13/2018 CLINICAL DATA:  Unwitnessed fall EXAM: CT HEAD WITHOUT CONTRAST CT CERVICAL SPINE WITHOUT CONTRAST TECHNIQUE: Multidetector CT imaging of the head and cervical spine was performed following the standard protocol without intravenous contrast. Multiplanar CT image reconstructions of the cervical spine were also generated. COMPARISON:  07/11/2018 head CT FINDINGS: CT HEAD FINDINGS Brain: No acute territorial infarction, hemorrhage or intracranial mass. Moderate small vessel ischemic changes of the white matter. Probable chronic lacunar infarcts within the bilateral basal ganglia. The ventricles are slightly enlarged, likely due to atrophy. Vascular: No hyperdense vessels. Vertebral and carotid vascular calcification Skull: Normal. Negative for fracture or focal lesion. Sinuses/Orbits: No acute finding. Other: None CT CERVICAL SPINE FINDINGS Alignment: Mild reversal of cervical lordosis. No subluxation. Facet alignment within normal limits. Skull base and vertebrae: No acute fracture. No primary bone lesion or focal pathologic process. Soft tissues and  spinal canal: No prevertebral fluid or swelling. No visible canal hematoma. Disc levels: Moderate degenerative changes at C3-C4, C5-C6 and C6-C7 with marked degenerative change at C4-C5. Multiple level facet degenerative changes. Multiple level foraminal narrowing, most significant at C4-C5. Upper chest: Negative. Other: None IMPRESSION: 1. No CT evidence for acute intracranial abnormality. Small-vessel ischemic changes of the white matter and mild atrophy. 2. Reversal of cervical lordosis with degenerative changes. No acute fracture is seen. Electronically Signed   By: Donavan Foil M.D.   On: 07/13/2018 18:59   Mr Brain Wo Contrast  Result Date: 07/15/2018 CLINICAL DATA:  84 y/o M; increased left-sided weakness with trip and left facial droop. EXAM: MRI HEAD WITHOUT CONTRAST TECHNIQUE: Multiplanar, multiecho pulse sequences of the brain and surrounding structures were obtained without intravenous contrast. COMPARISON:  07/14/2018 CT head FINDINGS: Brain: Right paramedian pontine reduced diffusion and T2 FLAIR hyperintensity measuring 18 x 12 mm (AP by ML series 5, image 48) compatible with late acute/early subacute infarction. No associated hemorrhage or mass effect. Stable advanced chronic microvascular ischemic changes and volume loss of the brain. No hydrocephalus, extra-axial collection, effacement of basilar cisterns, focal mass effect, or herniation. Small chronic lacunar infarcts are present within the thalami and lentiform nuclei. Vascular: Normal flow voids. Skull and upper cervical spine: Normal marrow signal. Sinuses/Orbits: Negative. Other: None. IMPRESSION: 1. 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 No associated hemorrhage or mass effect. 2. Stable background of advanced chronic microvascular ischemic changes and volume loss of the brain. These results will be called to the ordering clinician or representative by the Radiologist Assistant, and communication documented in  the PACS or zVision Dashboard. Electronically Signed   By: Kristine Garbe M.D.   On: 07/15/2018 02:48    Assessment: 84 y.o. male with left sided weakness since Monday 1. MRI reveals a subacute right paramedian pontine ischemic infarction. Exam findings are consistent with this. 2. Vascular dementia 3. Stroke Risk Factors - CAD and HTN  Plan: 1.  HgbA1c, fasting lipid panel 2. MRA of the brain without contrast 3. MRA neck with contrast (has good renal function) 4. TTE 5. Cardiac telemetry 6. PT consult, OT consult, Speech consult 7. Prophylactic therapy- Start ASA 81 mg po qd 8. Benefits of statin most likely outweighed by risks given advanced age 18. BP management. Out of permissive HTN time window 10. Frequent neuro checks  _0  signed: Dr. Kerney Elbe 07/15/2018, 3:13 AM

## 2018-07-15 NOTE — Progress Notes (Signed)
PROGRESS NOTE                                                                                                                                                                                                             Patient Demographics:    Ronald Matthews, is a 84 y.o. male, DOB - 1933-02-20, ZOX:[id number]  Admit date - 07/14/2018   Admitting Physician Briscoe Deutscher, MD  Outpatient Primary MD for the patient is Shelbie Ammons, MD  LOS - 0   Chief Complaint  Patient presents with  . Weakness  . Aphasia  . Gait Problem  . Urinary Incontinence       Brief Narrative    This is a no charge note as patient admitted earlier today   84 y.o. male with medical history significant for dementia, coronary artery disease, and hypertension, now presenting to the emergency department for evaluation of left-sided weakness, dysarthria, and falls. ,  Work-up significant for ischemic CVA   Subjective:    Ronald Matthews today has, No headache, No chest pain, No abdominal pain - No Nausea,   Assessment  & Plan :    Principal Problem:   Ischemic stroke [facility]) Active Problems:   Hypertension   Dementia   Coronary artery disease   Ischemic stroke  - Presents with left-sided weakness and dysarthria, present since 07/13/18 per ALF personnel - Head CT negative for acute findings, MRI brain with right paramedian pontine late acute/early subacute CVA without hemorrhage or mass-effect  - Neurology is consulting and much appreciated  - Continue cardiac monitoring, frequent neuro checks, PT/OT/SLP evals, swallow screen  - Check MRA head and neck, echocardiogram, A1c, and lipid panel - Start ASA 81 mg qD   Hypertension  - BP is at goal  - Continue Toprol     CAD  - No anginal complaints   - Continue Imdur and beta-blocker      Code Status : Full  Family Communication  : None at bedside  Disposition Plan  : SNF  Consults  :   Neurology  Procedures  : none  DVT Prophylaxis  :  lovenox  Lab Results  Component Value Date   PLT 201 07/14/2018    Antibiotics  :   Anti-infectives (From admission, onward)   None  Objective:   Vitals:   07/15/18 0700 07/15/18 1100 07/15/18 1130 07/15/18 1200  BP: (!) 145/65 (!) 151/72 (!) 165/78 (!) 166/71  Pulse: (!) 55 (!) 57 (!) 58 (!) 58  Resp: 13 14 14 14   Temp:      TempSrc:      SpO2: 100% 100% 97% 100%    Wt Readings from Last 3 Encounters:  07/13/18 84 kg  07/11/18 84 kg  07/11/18 84 kg    No intake or output data in the 24 hours ending 07/15/18 1444   Physical Exam  Patient awake alert x2, pleasant, slightly confused  Symmetrical Chest wall movement, Good air movement bilaterally, CTAB RRR,No Gallops,Rubs or new Murmurs, No Parasternal Heave +ve B.Sounds, Abd Soft, No tenderness,  No rebound - guarding or rigidity. No Cyanosis, Clubbing or edema, No new Rash or bruise , has mild dysarthria, upper extremity weakness 3 out of 5, right lower extremity 3-4 out of 5.    Data Review:    CBC Recent Labs  Lab 07/11/18 0913 07/14/18 1734 07/14/18 1814  WBC 4.6 6.7  --   HGB 13.1 12.5* 13.6  HCT 39.3* 39.9 40.0  PLT 187 201  --   MCV 86.0 88.9  --   MCH 28.7 27.8  --   MCHC 33.4 31.3  --   RDW 14.6* 14.5  --   LYMPHSABS  --  2.1  --   MONOABS  --  0.6  --   EOSABS  --  0.2  --   BASOSABS  --  0.0  --     Chemistries  Recent Labs  Lab 07/11/18 0913 07/14/18 1734 07/14/18 1814  NA 140 142 143  K 4.3 4.5 4.6  CL 107 109 108  CO2 26 24  --   GLUCOSE 107* 113* 108*  BUN 20 17 19   CREATININE 0.91 1.03 1.00  CALCIUM 8.9 9.3  --   AST 20 29  --   ALT 12 15  --   ALKPHOS 51 56  --   BILITOT 0.7 0.5  --    ------------------------------------------------------------------------------------------------------------------ Recent Labs    07/15/18 0432  CHOL 185  HDL 40*  LDLCALC 129*  TRIG 80  CHOLHDL 4.6    Lab Results   Component Value Date   HGBA1C 6.0 (H) 07/15/2018   ------------------------------------------------------------------------------------------------------------------ No results for input(s): TSH, T4TOTAL, T3FREE, THYROIDAB in the last 72 hours.  Invalid input(s): FREET3 ------------------------------------------------------------------------------------------------------------------ No results for input(s): VITAMINB12, FOLATE, FERRITIN, TIBC, IRON, RETICCTPCT in the last 72 hours.  Coagulation profile Recent Labs  Lab 07/14/18 1734  INR 1.09    No results for input(s): DDIMER in the last 72 hours.  Cardiac Enzymes Recent Labs  Lab 07/11/18 0913  TROPONINI <0.03   ------------------------------------------------------------------------------------------------------------------ No results found for: BNP  Inpatient Medications  Scheduled Meds: .  stroke: mapping our early stages of recovery book   Does not apply Once  . aspirin EC  81 mg Oral Daily  . brimonidine  2 drop Both Eyes BID  . enoxaparin (LOVENOX) injection  40 mg Subcutaneous Daily  . isosorbide mononitrate  15 mg Oral Daily  . metoprolol succinate  25 mg Oral Daily  . multivitamin with minerals  1 tablet Oral Daily  . travoprost (benzalkonium)  1 drop Both Eyes QHS   Continuous Infusions: PRN Meds:.acetaminophen **OR** acetaminophen (TYLENOL) oral liquid 160 mg/5 mL **OR** acetaminophen, senna-docusate  Micro Results Recent Results (from the past 240 hour(s))  Urine Culture  Status: Abnormal   Collection Time: 07/11/18  9:13 AM  Result Value Ref Range Status   Specimen Description   Final    URINE, RANDOM Performed at [facility], [address] [place], [place] [address]    Special Requests   Final    NONE Performed at [facility], [address] [place], [place] [address]    Culture MULTIPLE SPECIES PRESENT, SUGGEST RECOLLECTION (A)  Final   Report Status 07/12/2018  FINAL  Final    Radiology Reports Ct Head Wo Contrast  Result Date: 07/14/2018 CLINICAL DATA:  Weakness and inability to ambulate. Urinary and fecal incontinence. EXAM: CT HEAD WITHOUT CONTRAST TECHNIQUE: Contiguous axial images were obtained from the base of the skull through the vertex without intravenous contrast. COMPARISON:  07/13/2018 FINDINGS: Brain: There is no evidence for acute hemorrhage, hydrocephalus, mass lesion, or abnormal extra-axial fluid collection. No definite CT evidence for acute infarction. Patchy low attenuation in the deep hemispheric and periventricular white matter is nonspecific, but likely reflects chronic microvascular ischemic demyelination. Diffuse loss of parenchymal volume is consistent with atrophy. Prominence of the lateral ventricles is associated with sparing of the temporal tips suggesting ventral megaly related to atrophy. Vascular: No hyperdense vessel or unexpected calcification. Skull: No evidence for fracture. No worrisome lytic or sclerotic lesion. Sinuses/Orbits: The visualized paranasal sinuses and mastoid air cells are clear. Visualized portions of the globes and intraorbital fat are unremarkable. Other: None. IMPRESSION: 1. Stable exam.  No acute intracranial abnormality. 2. Atrophy with chronic small vessel white matter ischemic disease. Electronically Signed   By: Kennith Center M.D.   On: 07/14/2018 20:29   Ct Head Wo Contrast  Result Date: 07/13/2018 CLINICAL DATA:  Unwitnessed fall EXAM: CT HEAD WITHOUT CONTRAST CT CERVICAL SPINE WITHOUT CONTRAST TECHNIQUE: Multidetector CT imaging of the head and cervical spine was performed following the standard protocol without intravenous contrast. Multiplanar CT image reconstructions of the cervical spine were also generated. COMPARISON:  07/11/2018 head CT FINDINGS: CT HEAD FINDINGS Brain: No acute territorial infarction, hemorrhage or intracranial mass. Moderate small vessel ischemic changes of the white matter.  Probable chronic lacunar infarcts within the bilateral basal ganglia. The ventricles are slightly enlarged, likely due to atrophy. Vascular: No hyperdense vessels. Vertebral and carotid vascular calcification Skull: Normal. Negative for fracture or focal lesion. Sinuses/Orbits: No acute finding. Other: None CT CERVICAL SPINE FINDINGS Alignment: Mild reversal of cervical lordosis. No subluxation. Facet alignment within normal limits. Skull base and vertebrae: No acute fracture. No primary bone lesion or focal pathologic process. Soft tissues and spinal canal: No prevertebral fluid or swelling. No visible canal hematoma. Disc levels: Moderate degenerative changes at C3-C4, C5-C6 and C6-C7 with marked degenerative change at C4-C5. Multiple level facet degenerative changes. Multiple level foraminal narrowing, most significant at C4-C5. Upper chest: Negative. Other: None IMPRESSION: 1. No CT evidence for acute intracranial abnormality. Small-vessel ischemic changes of the white matter and mild atrophy. 2. Reversal of cervical lordosis with degenerative changes. No acute fracture is seen. Electronically Signed   By: Jasmine Pang M.D.   On: 07/13/2018 18:59   Ct Head Wo Contrast  Result Date: 07/11/2018 CLINICAL DATA:  84 year old male with weakness specifically in legs ongoing for some time. Denies pain. Initial encounter. EXAM: CT HEAD WITHOUT CONTRAST TECHNIQUE: Contiguous axial images were obtained from the base of the skull through the vertex without intravenous contrast. COMPARISON:  None. FINDINGS: Brain: No intracranial hemorrhage. Prominent chronic microvascular changes. This limits evaluation for detection  of a small infarct (particularly at the level of the anterior limb of the right internal capsule). No CT evidence of large acute infarct. Remote small basal ganglia infarcts. Moderate global atrophy. Dilated right occipital horn appears long-standing. Prominent size ventricles probably related to central  atrophy rather than hydrocephalus. Aqueduct is patent. Slightly hypoplastic inferior vermis. No intracranial mass lesion noted on this unenhanced exam. Vascular: Vascular calcifications.  No hyperdense vessel. Skull: No acute abnormality. Sinuses/Orbits: No acute orbital abnormality. Minimal mucosal thickening right maxillary sinus. Other: Mastoid air cells and middle ear cavities are clear. IMPRESSION: 1. No intracranial hemorrhage. 2. Prominent chronic microvascular changes. This limits evaluation for detection of a small infarct (particularly at the level of the anterior limb of the right internal capsule). No CT evidence of large acute infarct. Remote small basal ganglia infarcts. 3. Moderate global atrophy. Dilated right occipital horn appears long-standing. Prominent size ventricles probably related to central atrophy rather than hydrocephalus. Electronically Signed   By: Lacy DuverneySteven  Olson M.D.   On: 07/11/2018 10:00   Ct Cervical Spine Wo Contrast  Result Date: 07/13/2018 CLINICAL DATA:  Unwitnessed fall EXAM: CT HEAD WITHOUT CONTRAST CT CERVICAL SPINE WITHOUT CONTRAST TECHNIQUE: Multidetector CT imaging of the head and cervical spine was performed following the standard protocol without intravenous contrast. Multiplanar CT image reconstructions of the cervical spine were also generated. COMPARISON:  07/11/2018 head CT FINDINGS: CT HEAD FINDINGS Brain: No acute territorial infarction, hemorrhage or intracranial mass. Moderate small vessel ischemic changes of the white matter. Probable chronic lacunar infarcts within the bilateral basal ganglia. The ventricles are slightly enlarged, likely due to atrophy. Vascular: No hyperdense vessels. Vertebral and carotid vascular calcification Skull: Normal. Negative for fracture or focal lesion. Sinuses/Orbits: No acute finding. Other: None CT CERVICAL SPINE FINDINGS Alignment: Mild reversal of cervical lordosis. No subluxation. Facet alignment within normal limits.  Skull base and vertebrae: No acute fracture. No primary bone lesion or focal pathologic process. Soft tissues and spinal canal: No prevertebral fluid or swelling. No visible canal hematoma. Disc levels: Moderate degenerative changes at C3-C4, C5-C6 and C6-C7 with marked degenerative change at C4-C5. Multiple level facet degenerative changes. Multiple level foraminal narrowing, most significant at C4-C5. Upper chest: Negative. Other: None IMPRESSION: 1. No CT evidence for acute intracranial abnormality. Small-vessel ischemic changes of the white matter and mild atrophy. 2. Reversal of cervical lordosis with degenerative changes. No acute fracture is seen. Electronically Signed   By: Jasmine PangKim  Fujinaga M.D.   On: 07/13/2018 18:59   Mr Maxine GlennMra Head Wo Contrast  Result Date: 07/15/2018 CLINICAL DATA:  Acute pontine infarct. EXAM: MRA NECK WITHOUT AND WITH CONTRAST MRA HEAD WITHOUT CONTRAST TECHNIQUE: Multiplanar and multiecho pulse sequences of the neck were obtained without and with intravenous contrast. Angiographic images of the neck were obtained using MRA technique without and with intravenous contast.; Angiographic images of the Circle of Willis were obtained using MRA technique without intravenous contrast. CONTRAST:  9 cc Gadavist intravenous COMPARISON:  Noncontrast brain MRI from earlier today FINDINGS: MRA NECK FINDINGS Time-of-flight MRA shows antegrade flow in both carotids and vertebrals. Postcontrast imaging shows a normal diameter arch. Two vessel arch branching pattern. Both carotids are smooth and widely patent. The dominant right vertebral artery is smooth and widely patent. There is prominent atherosclerotic type irregularity of the left vertebral at the V3 segment. On conventional brain MRI there is no evidence of intramural thrombus within the covered vertebral artery. MRA HEAD FINDINGS Right vertebral artery dominance. Fenestration of the proximal  basilar. On reformats suboptimal signal in the central  lumen of the mid basilar is best attributed artifact (this is at the level of slab interface and there is likely some turbulence of flow related to fenestration). No luminal filling defect is seen on postcontrast neck MRA which also covers this area. More distal to this area is a moderate smooth narrowing. On conventional brain MRI there is no eccentric signal abnormality to suggest a intramural thrombus. Carotid arteries are symmetric with mild siphon irregularity from atherosclerosis. No branch occlusion. No aneurysm. IMPRESSION: Intracranial MRA: Moderate atheromatous type narrowing of the distal basilar. Neck MRA: 1. Prominent focal atherosclerotic type irregularity of the left V3 segment. The dominant right vertebral artery is widely patent. 2. Negative cervical carotids. Electronically Signed   By: Marnee Spring M.D.   On: 07/15/2018 09:28   Mr Maxine Glenn Neck W Wo Contrast  Result Date: 07/15/2018 CLINICAL DATA:  Acute pontine infarct. EXAM: MRA NECK WITHOUT AND WITH CONTRAST MRA HEAD WITHOUT CONTRAST TECHNIQUE: Multiplanar and multiecho pulse sequences of the neck were obtained without and with intravenous contrast. Angiographic images of the neck were obtained using MRA technique without and with intravenous contast.; Angiographic images of the Circle of Willis were obtained using MRA technique without intravenous contrast. CONTRAST:  9 cc Gadavist intravenous COMPARISON:  Noncontrast brain MRI from earlier today FINDINGS: MRA NECK FINDINGS Time-of-flight MRA shows antegrade flow in both carotids and vertebrals. Postcontrast imaging shows a normal diameter arch. Two vessel arch branching pattern. Both carotids are smooth and widely patent. The dominant right vertebral artery is smooth and widely patent. There is prominent atherosclerotic type irregularity of the left vertebral at the V3 segment. On conventional brain MRI there is no evidence of intramural thrombus within the covered vertebral artery. MRA  HEAD FINDINGS Right vertebral artery dominance. Fenestration of the proximal basilar. On reformats suboptimal signal in the central lumen of the mid basilar is best attributed artifact (this is at the level of slab interface and there is likely some turbulence of flow related to fenestration). No luminal filling defect is seen on postcontrast neck MRA which also covers this area. More distal to this area is a moderate smooth narrowing. On conventional brain MRI there is no eccentric signal abnormality to suggest a intramural thrombus. Carotid arteries are symmetric with mild siphon irregularity from atherosclerosis. No branch occlusion. No aneurysm. IMPRESSION: Intracranial MRA: Moderate atheromatous type narrowing of the distal basilar. Neck MRA: 1. Prominent focal atherosclerotic type irregularity of the left V3 segment. The dominant right vertebral artery is widely patent. 2. Negative cervical carotids. Electronically Signed   By: Marnee Spring M.D.   On: 07/15/2018 09:28   Mr Brain Wo Contrast  Result Date: 07/15/2018 CLINICAL DATA:  84 y/o M; increased left-sided weakness with trip and left facial droop. EXAM: MRI HEAD WITHOUT CONTRAST TECHNIQUE: Multiplanar, multiecho pulse sequences of the brain and surrounding structures were obtained without intravenous contrast. COMPARISON:  07/14/2018 CT head FINDINGS: Brain: Right paramedian pontine reduced diffusion and T2 FLAIR hyperintensity measuring 18 x 12 mm (AP by ML series 5, image 48) compatible with late acute/early subacute infarction. No associated hemorrhage or mass effect. Stable advanced chronic microvascular ischemic changes and volume loss of the brain. No hydrocephalus, extra-axial collection, effacement of basilar cisterns, focal mass effect, or herniation. Small chronic lacunar infarcts are present within the thalami and lentiform nuclei. Vascular: Normal flow voids. Skull and upper cervical spine: Normal marrow signal. Sinuses/Orbits:  Negative. Other: None. IMPRESSION: 1. Right  paramedian pontine late acute/early subacute infarction. No associated hemorrhage or mass effect. 2. Stable background of advanced chronic microvascular ischemic changes and volume loss of the brain. These results will be called to the ordering clinician or representative by the Radiologist Assistant, and communication documented in the PACS or zVision Dashboard. Electronically Signed   By: Mitzi Hansen M.D.   On: 07/15/2018 02:48      Huey Bienenstock M.D on 07/15/2018 at 2:44 PM  Between 7am to 7pm - Pager - [phone]  After 7pm go to [email or web address] - password [facility]  [facility] -  Office  [phone]

## 2018-07-15 NOTE — ED Notes (Signed)
Report attempted. Asked to give report to someone else, but they replied they had offered to take report but that the accepting nurse Marisue IvanLiz prefers to take her own report.

## 2018-07-15 NOTE — Progress Notes (Signed)
  Echocardiogram 2D Echocardiogram has been performed.  Demontay Grantham L Androw 07/15/2018, 2:25 PM

## 2018-07-15 NOTE — ED Notes (Signed)
Patient transported to US 

## 2018-07-15 NOTE — Evaluation (Signed)
Physical Therapy Evaluation Patient Details Name: Ronald Matthews MRN: [id number] DOB: 17-Oct-1933 Today's Date: 07/15/2018   History of Present Illness  Pt is an 84 y/o male admitted secondary to L sided weakness, dysarthria, and falls. Imaging revealed R paramedian pontine infarct. PMH includes dementia, CAD, and HTN.   Clinical Impression  Pt admitted secondary to problem above with deficits below. Pt with weakness in L extremities, decreased sensation in L extremities, and decreased balance. Required mod to max A to perform mobility tasks this session. Pt with dementia at baseline, and unsure of previous home environment. Per notes, likely from SNF. Feel pt will require post acute rehab at d/c. Will continue to follow acutely to maximize functional mobility independence and safety.     Follow Up Recommendations SNF;Supervision/Assistance - 24 hour    Equipment Recommendations  None recommended by PT    Recommendations for Other Services       Precautions / Restrictions Precautions Precautions: Fall Restrictions Weight Bearing Restrictions: No      Mobility  Bed Mobility Overal bed mobility: Needs Assistance Bed Mobility: Supine to Sit;Sit to Supine     Supine to sit: Mod assist Sit to supine: Mod assist   General bed mobility comments: Mod A for trunk elevation and LLE assist to come to EOB. Required assist to scoot hips to EOB as well. Mod A for LLE lift assist and assist with trunk descent for return to supine.   Transfers Overall transfer level: Needs assistance Equipment used: 1 person hand held assist Transfers: Sit to/from Stand Sit to Stand: Max assist         General transfer comment: Max A for lift assist and steadying. Manually blocked LLE to avoid buckling. PT stood in front of pt to assist with transfer.   Ambulation/Gait             General Gait Details: Unable   Stairs            Wheelchair Mobility    Modified Rankin (Stroke Patients  Only)       Balance Overall balance assessment: Needs assistance Sitting-balance support: Single extremity supported;Feet supported Sitting balance-Leahy Scale: Poor Sitting balance - Comments: Min to min guard A for sitting balance.    Standing balance support: Single extremity supported Standing balance-Leahy Scale: Poor Standing balance comment: Reliant on RUE support and external support.                              Pertinent Vitals/Pain Pain Assessment: No/denies pain    Home Living Family/patient expects to be discharged to:: Skilled nursing facility                 Additional Comments: Per notes, likely from facility. Pt unable to report where he was staying PTA.     Prior Function           Comments: Pt reports he was ambulating, however, per notes, has had falls getting to/from WC.      Hand Dominance        Extremity/Trunk Assessment   Upper Extremity Assessment Upper Extremity Assessment: Defer to OT evaluation;LUE deficits/detail LUE Deficits / Details: Able to lift LUE to shoulder height. Increased tone noted in biceps, however, was able to perform PROM into extension at elbow. Unable to extend fingers.    Lower Extremity Assessment Lower Extremity Assessment: LLE deficits/detail LLE Deficits / Details: Grossly 2/5 throughout LLE.  LLE Sensation: decreased  light touch    Cervical / Trunk Assessment Cervical / Trunk Assessment: Kyphotic  Communication   Communication: No difficulties  Cognition Arousal/Alertness: Awake/alert Behavior During Therapy: WFL for tasks assessed/performed Overall Cognitive Status: History of cognitive impairments - at baseline                                 General Comments: Dementia per chart. Pt oriented to person, place, and time. Did not know why he was here or where he lived.       General Comments General comments (skin integrity, edema, etc.): No family/caregiver present     Exercises     Assessment/Plan    PT Assessment Patient needs continued PT services  PT Problem List Decreased strength;Decreased balance;Decreased mobility;Decreased cognition;Decreased knowledge of use of DME;Decreased knowledge of precautions;Decreased safety awareness       PT Treatment Interventions DME instruction;Gait training;Functional mobility training;Therapeutic activities;Balance training;Therapeutic exercise;Patient/family education    PT Goals (Current goals can be found in the Care Plan section)  Acute Rehab PT Goals Patient Stated Goal: "For my left side to get stronger" PT Goal Formulation: With patient Time For Goal Achievement: 07/29/18 Potential to Achieve Goals: Fair    Frequency Min 3X/week   Barriers to discharge        Co-evaluation               AM-PAC PT "6 Clicks" Daily Activity  Outcome Measure Difficulty turning over in bed (including adjusting bedclothes, sheets and blankets)?: Unable Difficulty moving from lying on back to sitting on the side of the bed? : Unable Difficulty sitting down on and standing up from a chair with arms (e.g., wheelchair, bedside commode, etc,.)?: Unable Help needed moving to and from a bed to chair (including a wheelchair)?: A Lot Help needed walking in hospital room?: Total Help needed climbing 3-5 steps with a railing? : Total 6 Click Score: 7    End of Session Equipment Utilized During Treatment: Gait belt Activity Tolerance: Patient tolerated treatment well Patient left: in bed;with call bell/phone within reach Nurse Communication: Mobility status PT Visit Diagnosis: Unsteadiness on feet (R26.81);Muscle weakness (generalized) (M62.81);Hemiplegia and hemiparesis Hemiplegia - Right/Left: Left Hemiplegia - caused by: Cerebral infarction    Time: [id number] PT Time Calculation (min) (ACUTE ONLY): 17 min   Charges:   PT Evaluation $PT Eval Moderate Complexity: 1 Mod          Gladys DammeBrittany Arbor Cohen,  PT, DPT  Acute Rehabilitation Services  Pager: [phone] Office: [phone]   Lehman PromBrittany S Chantal Worthey 07/15/2018, 1:59 PM

## 2018-07-15 NOTE — ED Notes (Signed)
Neuro MD Lindzen at bedside 

## 2018-07-15 NOTE — Progress Notes (Signed)
Pt found attempting to climb out of bed, legs out of bed, bed alarm on, IV pulled out, tele off, pulse ox pulled off, condom cath pulled off. Pt able to tell RN name and DOB. Pt unsure of his location. Pt reoriented to location and situation. Pt bed changed, condom cath replaced, tele replaced. IV team consult placed, MD paged. CN aware.  

## 2018-07-15 NOTE — ED Notes (Signed)
Report attempted 

## 2018-07-15 NOTE — ED Notes (Signed)
Dr Randol KernElgergawy returned my call. He would like speech to see the pt, I will pass on to the floor nurse.

## 2018-07-15 NOTE — ED Notes (Signed)
PT working with pt.

## 2018-07-16 LAB — BASIC METABOLIC PANEL
Anion gap: 9 (ref 5–15)
BUN: 18 mg/dL (ref 8–23)
CHLORIDE: 106 mmol/L (ref 98–111)
CO2: 22 mmol/L (ref 22–32)
Calcium: 8.8 mg/dL — ABNORMAL LOW (ref 8.9–10.3)
Creatinine, Ser: 0.86 mg/dL (ref 0.61–1.24)
GFR calc Af Amer: >60 mL/min (ref >60)
Glucose, Bld: 88 mg/dL (ref 70–99)
POTASSIUM: 3.8 mmol/L (ref 3.5–5.1)
SODIUM: 137 mmol/L (ref 135–145)

## 2018-07-16 LAB — CBC
HCT: 36.2 % — ABNORMAL LOW (ref 39.0–52.0)
HEMOGLOBIN: 11.6 g/dL — AB (ref 13.0–17.0)
MCH: 27.8 pg (ref 26.0–34.0)
MCHC: 32 g/dL (ref 30.0–36.0)
MCV: 86.6 fL (ref 78.0–100.0)
PLATELETS: 187 10*3/uL (ref 150–400)
RBC: 4.18 MIL/uL — AB (ref 4.22–5.81)
RDW: 14.2 % (ref 11.5–15.5)
WBC: 6.6 10*3/uL (ref 4.0–10.5)

## 2018-07-16 LAB — MRSA PCR SCREENING: MRSA BY PCR: NEGATIVE

## 2018-07-16 MED ORDER — CLOPIDOGREL BISULFATE 75 MG PO TABS
75.0000 mg | ORAL_TABLET | Freq: Every day | ORAL | Status: DC
  Administered 2018-07-16 – 2018-07-18 (×3): 75 mg via ORAL
  Filled 2018-07-16 (×3): qty 1

## 2018-07-16 MED ORDER — ATORVASTATIN CALCIUM 40 MG PO TABS
40.0000 mg | ORAL_TABLET | Freq: Every day | ORAL | Status: DC
  Administered 2018-07-16 – 2018-07-17 (×2): 40 mg via ORAL
  Filled 2018-07-16 (×2): qty 1

## 2018-07-16 NOTE — NC FL2 (Signed)
[place] MEDICAID FL2 LEVEL OF CARE SCREENING TOOL     IDENTIFICATION  Patient Name: Ronald Matthews Birthdate: [birth year] Sex: male Admission Date (Current Location): 07/14/2018  [place] and [place] Number:  Producer, television/film/video and Address:  [facility] [place], [address] [place], [place] [address]      Provider Number: [id number]  Attending Physician Name and Address:  Elgergawy, Leana Roe, MD  Relative Name and Phone Number:       Current Level of Care: Hospital Recommended Level of Care: Skilled Nursing Facility Prior Approval Number:    Date Approved/Denied:   PASRR Number: pending  Discharge Plan: SNF    Current Diagnoses: Patient Active Problem List   Diagnosis Date Noted  . Ischemic stroke (HCC) 07/15/2018  . Hypertension 07/15/2018  . Dementia 07/15/2018  . Coronary artery disease 07/15/2018    Orientation RESPIRATION BLADDER Height & Weight     Self, Place  Normal Incontinent, External catheter(placed 9/12) Weight: 185 lb 3 oz (84 kg) Height:  6\' 4"  (193 cm)  BEHAVIORAL SYMPTOMS/MOOD NEUROLOGICAL BOWEL NUTRITION STATUS      Incontinent Diet(DYS 3 diet, thin liquids)  AMBULATORY STATUS COMMUNICATION OF NEEDS Skin   Extensive Assist Verbally Normal                       Personal Care Assistance Level of Assistance  Bathing, Dressing, Feeding Bathing Assistance: Maximum assistance Feeding assistance: Limited assistance Dressing Assistance: Maximum assistance     Functional Limitations Info  Sight, Hearing, Speech Sight Info: Adequate Hearing Info: Adequate Speech Info: Adequate    SPECIAL CARE FACTORS FREQUENCY  OT (By licensed OT), PT (By licensed PT)     PT Frequency: 3x OT Frequency: 3x            Contractures Contractures Info: Not present    Additional Factors Info  Code Status, Allergies Code Status Info: Full Code Allergies Info: Ppd Tuberculin Purified Protein Derivative, Tuberculin Tests            Current Medications (07/16/2018):  This is the current hospital active medication list Current Facility-Administered Medications  Medication Dose Route Frequency Provider Last Rate Last Dose  .  stroke: mapping our early stages of recovery book   Does not apply Once Opyd, Timothy S, MD      . 0.9 %  sodium chloride infusion   Intravenous Continuous Elgergawy, Leana Roe, MD 50 mL/hr at 07/15/18 2300 1,000 mL at 07/15/18 2300  . acetaminophen (TYLENOL) tablet 650 mg  650 mg Oral Q4H PRN Opyd, Lavone Neri, MD       Or  . acetaminophen (TYLENOL) solution 650 mg  650 mg Per Tube Q4H PRN Opyd, Lavone Neri, MD       Or  . acetaminophen (TYLENOL) suppository 650 mg  650 mg Rectal Q4H PRN Opyd, Lavone Neri, MD      . aspirin EC tablet 81 mg  81 mg Oral Daily Opyd, Lavone Neri, MD   81 mg at 07/16/18 0921  . atorvastatin (LIPITOR) tablet 40 mg  40 mg Oral q1800 Marvel Plan, MD      . brimonidine (ALPHAGAN) 0.2 % ophthalmic solution 2 drop  2 drop Both Eyes BID Opyd, Lavone Neri, MD   2 drop at 07/16/18 0921  . clopidogrel (PLAVIX) tablet 75 mg  75 mg Oral Daily Marvel Plan, MD   75 mg at 07/16/18 0921  . enoxaparin (LOVENOX) injection 40 mg  40  mg Subcutaneous Daily Opyd, Lavone Neriimothy S, MD   40 mg at 07/16/18 [id number]  . haloperidol lactate (HALDOL) injection 2 mg  2 mg Intravenous Q6H PRN Elgergawy, Leana Roeawood S, MD   2 mg at 07/16/18 0116  . isosorbide mononitrate (IMDUR) 24 hr tablet 15 mg  15 mg Oral Daily Opyd, Lavone Neriimothy S, MD   15 mg at 07/16/18 0921  . metoprolol succinate (TOPROL-XL) 24 hr tablet 25 mg  25 mg Oral Daily Opyd, Lavone Neriimothy S, MD   25 mg at 07/16/18 [id number]  . multivitamin with minerals tablet 1 tablet  1 tablet Oral Daily Opyd, Lavone Neriimothy S, MD   1 tablet at 07/16/18 0921  . senna-docusate (Senokot-S) tablet 1 tablet  1 tablet Oral QHS PRN Opyd, Lavone Neriimothy S, MD      . Travoprost (BAK Free) (TRAVATAN) 0.004 % ophthalmic solution SOLN 1 drop  1 drop Both Eyes QHS Opyd, Lavone Neriimothy S, MD   1 drop at 07/16/18 0008      Discharge Medications: Please see discharge summary for a list of discharge medications.  Relevant Imaging Results:  Relevant Lab Results:   Additional Information SSN: [id number]  Maree KrabbeBridget A Rodolphe Edmonston, LCSW

## 2018-07-16 NOTE — Evaluation (Signed)
Occupational Therapy Evaluation Patient Details Name: Ronald PalmsHerman Matthews MRN: [id number] DOB: 1933-03-26 Today's Date: 07/16/2018    History of Present Illness Pt is an 84 y/o male admitted secondary to L sided weakness, dysarthria, and falls. Imaging revealed R paramedian pontine infarct. PMH includes dementia, CAD, and HTN.    Clinical Impression   DISCUSSED WITH PATIENT NURSE THAT HIS L UE APPEARS WORSE THAN THE PREVIOUS MD NOTES AND SHE STATES SHE IS AWARE. PATIENT WAS ABLE TO FOLLOW DIRECTIONS AND WAS COOPERTIVE DURING EVALUATION. PATIENT IS DIFFICULTY TO UNDERSTAND BUT AT TIMES HIS SPEECH DOES IMPROVE. PATIENT IS NOT ABLE TO USE L UE FUNCTIONALLY AND NEEDS EDUCTION ON PERFORMING SROM AND HEMI TECHNIQUE FOR ADLS. ACUTE OT TO FOLLOW.    Follow Up Recommendations  SNF    Equipment Recommendations       Recommendations for Other Services       Precautions / Restrictions Precautions Precautions: Fall Restrictions Weight Bearing Restrictions: No      Mobility Bed Mobility                  Transfers       Sit to Stand: Max assist              Balance           Standing balance support: Single extremity supported Standing balance-Leahy Scale: Poor                             ADL either performed or assessed with clinical judgement   ADL Overall ADL's : Needs assistance/impaired Eating/Feeding: Set up   Grooming: Wash/dry hands;Wash/dry face;Minimal assistance;Moderate assistance   Upper Body Bathing: Moderate assistance   Lower Body Bathing: Maximal assistance   Upper Body Dressing : Moderate assistance;Maximal assistance   Lower Body Dressing: Total assistance;+2 for physical assistance               Functional mobility during ADLs: Maximal assistance(MAX A FOR SIT TO STAND) General ADL Comments: PATIENT IS REQUIRING ASSIST SECONDAR;Y TO HEMIPARESIS ON L. PATIENT IS REQUIRING EXTENSIVE ASSIST WITH MOBILITY CURRENTLY.      Vision Baseline Vision/History: Wears glasses Wears Glasses: Reading only Patient Visual Report: No change from baseline Vision Assessment?: No apparent visual deficits     Perception     Praxis      Pertinent Vitals/Pain Faces Pain Scale: No hurt     Hand Dominance Right   Extremity/Trunk Assessment Upper Extremity Assessment Upper Extremity Assessment: LUE deficits/detail LUE Deficits / Details: UNABLE TO LIFT SHLD, UNABLE TO MOVE ELBOW INTO FLEX/EXT SECONDARY TO TONE. L UE IS IN FLEXTION PATTERN LUE: (NO VOLTIONAL EXT OF WRIST OR DIGITS. PNT HAS FLEX PATTERN FO)           Communication Communication Communication: (difficult to understand)   Cognition Arousal/Alertness: Awake/alert Behavior During Therapy: WFL for tasks assessed/performed Overall Cognitive Status: No family/caregiver present to determine baseline cognitive functioning                                 General Comments: PATIENT HAS HX OF DEMENTIA   General Comments       Exercises     Shoulder Instructions      Home Living Family/patient expects to be discharged to:: Skilled nursing facility  Additional Comments: PER RECORDS [facility]      Prior Functioning/Environment          Comments: UNKNOWN, PNT IS NOT ACCURITE HISTORIAN        OT Problem List: Decreased strength;Decreased range of motion;Decreased activity tolerance;Impaired balance (sitting and/or standing);Decreased coordination;Decreased cognition;Decreased safety awareness;Impaired tone;Impaired UE functional use      OT Treatment/Interventions: Self-care/ADL training;Neuromuscular education;Therapeutic activities;Patient/family education    OT Goals(Current goals can be found in the care plan section) Acute Rehab OT Goals Patient Stated Goal: TO BE ABLE TO MOVE BETTER OT Goal Formulation: With patient Time For Goal Achievement:  07/30/18 Potential to Achieve Goals: Good  OT Frequency: Min 2X/week   Barriers to D/C:            Co-evaluation              AM-PAC PT "6 Clicks" Daily Activity     Outcome Measure Help from another person eating meals?: A Little Help from another person taking care of personal grooming?: A Lot Help from another person toileting, which includes using toliet, bedpan, or urinal?: Total Help from another person bathing (including washing, rinsing, drying)?: A Lot Help from another person to put on and taking off regular upper body clothing?: A Lot Help from another person to put on and taking off regular lower body clothing?: Total 6 Click Score: 11   End of Session Nurse Communication: (NURSE OK THERAPY)  Activity Tolerance: Patient tolerated treatment well Patient left: in chair;with call bell/phone within reach;with chair alarm set  OT Visit Diagnosis: Hemiplegia and hemiparesis Hemiplegia - Right/Left: Left Hemiplegia - dominant/non-dominant: Non-Dominant Hemiplegia - caused by: Cerebral infarction                Time: 4098-1191 OT Time Calculation (min): 53 min Charges:  OT General Charges $OT Visit: 1 Visit OT Evaluation $OT Eval Moderate Complexity: 1 Mod OT Treatments $Self Care/Home Management : 8-22 mins 6 CLICKS Arista Kettlewell 07/16/2018, 8:27 AM

## 2018-07-16 NOTE — Clinical Social Work Note (Signed)
Clinical Social Work Assessment  Patient Details  Name: Ronald PalmsHerman Mckissack MRN: [id number] Date of Birth: [birth year]  Date of referral:  07/16/18               Reason for consult:  Facility Placement                Permission sought to share information with:    Permission granted to share information::     Name::        Agency::     Relationship::     Contact Information:     Housing/Transportation Living arrangements for the past 2 months:  Assisted Living Facility Source of Information:  Other (Comment Required)(Care coordinator) Patient Interpreter Needed:  None Criminal Activity/Legal Involvement Pertinent to Current Situation/Hospitalization:  No - Comment as needed Significant Relationships:  Other(Comment), Other Family Members(Care coordinator) Lives with:  Self Do you feel safe going back to the place where you live?  Yes Need for family participation in patient care:  No (Coment)  Care giving concerns:  Pt is only alert and oriented x2. In pt's contacts pt has a individual by the name of Tammy bolded--CSW reached [facility] to [facility] via telephone.   Social Worker assessment / plan:  CSW spoke with Tammy via telephone. Tammy is pt's care coordinator at [facility]. CSW inquired about family members and Tammy states pt's family has not been involved in the 11 years pt has been at the facility. Tammy was saddened to hear that PT is recommending SNF--Tammy states pt has been with them for a very long time. Tammy states she was at the hospital visiting pt this morning. Tammy is going to take this case to management tomorrow to determine if they can accommodate pt. Tammy will reach out to CSW tomorrow.  Employment status:  Retired Health and safety inspectornsurance information:  Medicare PT Recommendations:  Skilled Nursing Facility Information / Referral to community resources:  Skilled Nursing Facility  Patient/Family's Response to care:  Tammy verbalized understanding of CSW role and expressed appreciation  for support. Tammy denies any concern regarding pt care at this time.   Patient/Family's Understanding of and Emotional Response to Diagnosis, Current Treatment, and Prognosis: Tammy understanding and realistic regarding pt's physical limitations. Tammy understands the need for SNF placement for pt at d/c--however, Tammy will discuss with [facility] management tomorrow to determine if pt can be accommodated at ALF. Tammy will follow up with CSW regarding decision.   Emotional Assessment Appearance:  Appears stated age Attitude/Demeanor/Rapport:  Unable to Assess Affect (typically observed):  Unable to Assess Orientation:  Oriented to Self, Oriented to Place Alcohol / Substance use:  Not Applicable Psych involvement (Current and /or in the community):  No (Comment)  Discharge Needs  Concerns to be addressed:  Basic Needs, Care Coordination Readmission within the last 30 days:  No Current discharge risk:  Dependent with Mobility Barriers to Discharge:  Continued Medical Work up   Pacific MutualBridget A Meldrick Buttery, LCSW 07/16/2018, 3:34 PM

## 2018-07-16 NOTE — Progress Notes (Signed)
Called by telemetry tech and notified that pt had a pause of 3.10 sec. Notified Dr Randol KernElgergawy. See new orders.

## 2018-07-16 NOTE — Progress Notes (Signed)
STROKE TEAM PROGRESS NOTE   SUBJECTIVE (INTERVAL HISTORY) His son and daughter are at the bedside.  Patient lying in bed, continues to have left facial droop and left hemiparesis.  MRI showed pontine stroke.  Continue PT/OT evaluation.  OBJECTIVE Vitals:   07/16/18 0000 07/16/18 0303 07/16/18 0820 07/16/18 1217  BP: (!) 158/76 (!) 163/81 (!) 157/83 (!) 149/69  Pulse:  (!) 57 68 76  Resp: 17 18 18 18   Temp: 98 F (36.7 C) 98.4 F (36.9 C) 98.5 F (36.9 C) 98.9 F (37.2 C)  TempSrc: Oral Oral Oral Oral  SpO2: 100% 97% 100% 99%  Weight:      Height:        CBC:  Recent Labs  Lab 07/14/18 1734 07/14/18 1814 07/16/18 0631  WBC 6.7  --  6.6  NEUTROABS 3.9  --   --   HGB 12.5* 13.6 11.6*  HCT 39.9 40.0 36.2*  MCV 88.9  --  86.6  PLT 201  --  187    Basic Metabolic Panel:  Recent Labs  Lab 07/14/18 1734 07/14/18 1814 07/16/18 0631  NA 142 143 137  K 4.5 4.6 3.8  CL 109 108 106  CO2 24  --  22  GLUCOSE 113* 108* 88  BUN 17 19 18   CREATININE 1.03 1.00 0.86  CALCIUM 9.3  --  8.8*    Lipid Panel:     Component Value Date/Time   CHOL 185 07/15/2018 0432   TRIG 80 07/15/2018 0432   HDL 40 (L) 07/15/2018 0432   CHOLHDL 4.6 07/15/2018 0432   VLDL 16 07/15/2018 0432   LDLCALC 129 (H) 07/15/2018 0432   HgbA1c:  Lab Results  Component Value Date   HGBA1C 6.0 (H) 07/15/2018   Urine Drug Screen: No results found for: LABOPIA, COCAINSCRNUR, LABBENZ, AMPHETMU, THCU, LABBARB  Alcohol Level No results found for: ETH  IMAGING  Ct Head Wo Contrast 07/14/2018 IMPRESSION:  1. Stable exam.  No acute intracranial abnormality.  2. Atrophy with chronic small vessel white matter ischemic disease.    Mr Maxine GlennMra Neck W Wo Contrast 07/15/2018 IMPRESSION:   Intracranial MRA:  Moderate atheromatous type narrowing of the distal basilar.   Neck MRA:  1. Prominent focal atherosclerotic type irregularity of the left V3 segment. The dominant right vertebral artery is widely  patent.  2. Negative cervical carotids.    Mr Brain Wo Contrast 07/15/2018 IMPRESSION:  1. 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 No associated hemorrhage or mass effect.  2. Stable background of advanced chronic microvascular ischemic changes and volume loss of the brain.    Transthoracic Echocardiogram  07/15/2018 Study Conclusions  - Left ventricle: The cavity size was normal. There was mild   concentric hypertrophy. Systolic function was normal. The   estimated ejection fraction was in the range of 55% to 60%. Wall   motion was normal; there were no regional wall motion   abnormalities. Doppler parameters are consistent with abnormal   left ventricular relaxation (grade 1 diastolic dysfunction). - Aortic valve: Valve area (VTI): 2.98 cm^2. Valve area (Vmax):   3.15 cm^2. Valve area (Vmean): 2.99 cm^2. - Mitral valve: Valve area by pressure half-time: 2.34 cm^2. - Left atrium: The atrium was mildly dilated.     PHYSICAL EXAM  Temp:  [97.9 F (36.6 C)-98.9 F (37.2 C)] 98.9 F (37.2 C) (09/14 1217) Pulse Rate:  [56-79] 76 (09/14 1217) Resp:  [12-18] 18 (09/14 1217) BP: (125-163)/(62-87) 149/69 (09/14 1217) SpO2:  [97 %-  100 %] 99 % (09/14 1217) Weight:  [84 kg] 84 kg (09/13 2010)  General - cachectic, well developed, in no apparent distress.  Ophthalmologic - fundi not visualized due to noncooperation.  Cardiovascular - Regular rate and rhythm.  Mental Status -  Level of arousal and orientation to time, place, and person were intact. Language including expression, naming, repetition, comprehension was assessed and found to have moderate dysarthria, no aphasia but paucity of speech, naming 2/3 and able to repeat.  Cranial Nerves II - XII - II - Visual field intact OU. III, IV, VI - Extraocular movements intact. V - Facial sensation intact bilaterally. VII - left mild facial droop. VIII - Hearing & vestibular intact bilaterally. X -  Palate elevates symmetrically. XI - Chin turning & shoulder shrug intact bilaterally. XII - Tongue protrusion intact.  Motor Strength - The patient's strength was normal in RUE and RLE, however, LUE 2-/5 deltoid, 2/5 bicep and tricep and finger movement. RLE proximal 3/5 and distal PF/RF 0/5.  Bulk was normal and fasciculations were absent.   Motor Tone - Muscle tone was assessed at the neck and appendages and was normal.  Reflexes - The patient's reflexes were symmetrical in all extremities and he had no pathological reflexes.  Sensory - Light touch, temperature/pinprick were assessed and were symmetrical.    Coordination - The patient had normal movements in the right hand with no ataxia or dysmetria.  Tremor was absent.  Gait and Station - deferred.    ASSESSMENT/PLAN Ronald Matthews is a 84 y.o. male with history of tobacco use CAD, Htn, dementia and thrombocytopenia presenting with Lt sided weakness, slurred speech and incontinence. He did not receive IV t-PA due to late presentation.  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  MRA head and neck - focal atherosclerotic type irregularity of basilar artery and left V3 segment.   2D Echo  - EF 55 - 60%. No cardiac source of emboli identified.   LDL - 129  HgbA1c - 6.0  VTE prophylaxis - Lovenox  Diet - Dysphagia 3 with thin liquids  No antithrombotic prior to admission, now on aspirin 81 mg daily and clopidogrel 75 mg daily.  Continue DAPT for 3 weeks and then aspirin alone.  Patient counseled to be compliant with his antithrombotic medications  Ongoing aggressive stroke risk factor management  Therapy recommendations:  SNF recommended  Disposition:  Pending  Hypertension  Stable . Permissive hypertension (OK if < 220/120) but gradually normalize  in 5-7 days . Long-term BP goal normotensive  Hyperlipidemia  Lipid lowering medication PTA:  none  LDL 129, goal < 70  Current lipid lowering medication: Lipitor 40 mg daily  Continue statin at discharge  Tobacco abuse  Current smoker  Smoking cessation counseling provided  Pt is willing to quit  Other Stroke Risk Factors  Advanced age  Coronary artery disease  Other Active Problems  Thrombocytopenia platelet 187  Dementia  Hospital day # 1  Neurology will sign off. Please call with questions. Pt will follow up with stroke clinic NP at [facility] in about 4 weeks. Thanks for the consult.  Marvel Plan, MD PhD Stroke Neurology 07/16/2018 3:52 PM    To contact Stroke Continuity provider, please refer to [email or web address]. After hours, contact General Neurology

## 2018-07-16 NOTE — Progress Notes (Signed)
PROGRESS NOTE                                                                                                                                                                                                             Patient Demographics:    Ronald Matthews, is a 84 y.o. male, DOB - 1933/10/16, ZOX:[id number]  Admit date - 07/14/2018   Admitting Physician Briscoe Deutscher, MD  Outpatient Primary MD for the patient is Shelbie Ammons, MD  LOS - 1   Chief Complaint  Patient presents with  . Weakness  . Aphasia  . Gait Problem  . Urinary Incontinence       Brief Narrative     84 y.o. male with medical history significant for dementia, coronary artery disease, and hypertension, now presenting to the emergency department for evaluation of left-sided weakness, dysarthria, and falls. ,  Work-up significant for ischemic CVA   Subjective:    Ronald Matthews today has, No headache, No chest pain, No abdominal pain - No Nausea, he had an episode of confusion overnight   Assessment  & Plan :    Principal Problem:   Ischemic stroke (HCC) Active Problems:   Hypertension   Dementia   Coronary artery disease   Ischemic stroke  -Patient presented with left-sided weakness, slurred speech, not IV TPA candidate due to late presentation, MRI of the head significant for right paramedian pontine subacute infarct, MRA head significant for focal atherosclerosis in left V3 segment, 2D echo with no evidence of embolic source, LDL is 129, A1c is 6,. -Seen by PT/OT and SLP -No antithrombotic before presentation, currently on aspirin and Plavix per neuro recommendation. -PT recommending SNF  Hypertension  - Continue Toprol     CAD  - No anginal complaints   - Continue Imdur and beta-blocker  -Noted on statin  Hyperlipidemia -LDL is 129, started on Lipitor      Code Status : Full  Family Communication  : Left his sister voicemail  Disposition Plan  :  SNF  Consults  :  Neurology  Procedures  : none  DVT Prophylaxis  :  lovenox  Lab Results  Component Value Date   PLT 187 07/16/2018    Antibiotics  :   Anti-infectives (From admission, onward)   None        Objective:  Vitals:   07/16/18 0000 07/16/18 0303 07/16/18 0820 07/16/18 1217  BP: (!) 158/76 (!) 163/81 (!) 157/83 (!) 149/69  Pulse:  (!) 57 68 76  Resp: 17 18 18 18   Temp: 98 F (36.7 C) 98.4 F (36.9 C) 98.5 F (36.9 C) 98.9 F (37.2 C)  TempSrc: Oral Oral Oral Oral  SpO2: 100% 97% 100% 99%  Weight:      Height:        Wt Readings from Last 3 Encounters:  07/15/18 84 kg  07/13/18 84 kg  07/11/18 84 kg     Intake/Output Summary (Last 24 hours) at 07/16/2018 1353 Last data filed at 07/16/2018 1115 Gross per 24 hour  Intake 749.05 ml  Output 600 ml  Net 149.05 ml     Physical Exam  Awake Alert, Oriented X2, sitting in recliner, in no apparent distress, with some mild left facial droop . symmetrical Chest wall movement, Good air movement bilaterally, CTAB RRR,No Gallops,Rubs or new Murmurs, No Parasternal Heave +ve B.Sounds, Abd Soft, No tenderness, No rebound - guarding or rigidity. No Cyanosis, Clubbing or edema, No new Rash or bruise   Patient with significant left-sided weakness, more prior pronounced in the upper extremity    Data Review:    CBC Recent Labs  Lab 07/11/18 0913 07/14/18 1734 07/14/18 1814 07/16/18 0631  WBC 4.6 6.7  --  6.6  HGB 13.1 12.5* 13.6 11.6*  HCT 39.3* 39.9 40.0 36.2*  PLT 187 201  --  187  MCV 86.0 88.9  --  86.6  MCH 28.7 27.8  --  27.8  MCHC 33.4 31.3  --  32.0  RDW 14.6* 14.5  --  14.2  LYMPHSABS  --  2.1  --   --   MONOABS  --  0.6  --   --   EOSABS  --  0.2  --   --   BASOSABS  --  0.0  --   --     Chemistries  Recent Labs  Lab 07/11/18 0913 07/14/18 1734 07/14/18 1814 07/16/18 0631  NA 140 142 143 137  K 4.3 4.5 4.6 3.8  CL 107 109 108 106  CO2 26 24  --  22  GLUCOSE 107* 113*  108* 88  BUN 20 17 19 18   CREATININE 0.91 1.03 1.00 0.86  CALCIUM 8.9 9.3  --  8.8*  AST 20 29  --   --   ALT 12 15  --   --   ALKPHOS 51 56  --   --   BILITOT 0.7 0.5  --   --    ------------------------------------------------------------------------------------------------------------------ Recent Labs    07/15/18 0432  CHOL 185  HDL 40*  LDLCALC 129*  TRIG 80  CHOLHDL 4.6    Lab Results  Component Value Date   HGBA1C 6.0 (H) 07/15/2018   ------------------------------------------------------------------------------------------------------------------ No results for input(s): TSH, T4TOTAL, T3FREE, THYROIDAB in the last 72 hours.  Invalid input(s): FREET3 ------------------------------------------------------------------------------------------------------------------ No results for input(s): VITAMINB12, FOLATE, FERRITIN, TIBC, IRON, RETICCTPCT in the last 72 hours.  Coagulation profile Recent Labs  Lab 07/14/18 1734  INR 1.09    No results for input(s): DDIMER in the last 72 hours.  Cardiac Enzymes Recent Labs  Lab 07/11/18 0913  TROPONINI <0.03   ------------------------------------------------------------------------------------------------------------------ No results found for: BNP  Inpatient Medications  Scheduled Meds: .  stroke: mapping our early stages of recovery book   Does not apply Once  . aspirin EC  81 mg Oral Daily  .  atorvastatin  40 mg Oral q1800  . brimonidine  2 drop Both Eyes BID  . clopidogrel  75 mg Oral Daily  . enoxaparin (LOVENOX) injection  40 mg Subcutaneous Daily  . isosorbide mononitrate  15 mg Oral Daily  . metoprolol succinate  25 mg Oral Daily  . multivitamin with minerals  1 tablet Oral Daily  . Travoprost (BAK Free)  1 drop Both Eyes QHS   Continuous Infusions: . sodium chloride 1,000 mL (07/15/18 2300)   PRN Meds:.acetaminophen **OR** acetaminophen (TYLENOL) oral liquid 160 mg/5 mL **OR** acetaminophen,  haloperidol lactate, senna-docusate  Micro Results Recent Results (from the past 240 hour(s))  Urine Culture     Status: Abnormal   Collection Time: 07/11/18  9:13 AM  Result Value Ref Range Status   Specimen Description   Final    URINE, RANDOM Performed at [facility], [address] [place], [place] [address]    Special Requests   Final    NONE Performed at [facility], [address] [place], [place] [address]    Culture MULTIPLE SPECIES PRESENT, SUGGEST RECOLLECTION (A)  Final   Report Status 07/12/2018 FINAL  Final  MRSA PCR Screening     Status: None   Collection Time: 07/16/18  4:02 AM  Result Value Ref Range Status   MRSA by PCR NEGATIVE NEGATIVE Final    Comment:        The GeneXpert MRSA Assay (FDA approved for NASAL specimens only), is one component of a comprehensive MRSA colonization surveillance program. It is not intended to diagnose MRSA infection nor to guide or monitor treatment for MRSA infections. Performed at [facility], [address], [place], [place] [address]     Radiology Reports Ct Head Wo Contrast  Result Date: 07/14/2018 CLINICAL DATA:  Weakness and inability to ambulate. Urinary and fecal incontinence. EXAM: CT HEAD WITHOUT CONTRAST TECHNIQUE: Contiguous axial images were obtained from the base of the skull through the vertex without intravenous contrast. COMPARISON:  07/13/2018 FINDINGS: Brain: There is no evidence for acute hemorrhage, hydrocephalus, mass lesion, or abnormal extra-axial fluid collection. No definite CT evidence for acute infarction. Patchy low attenuation in the deep hemispheric and periventricular white matter is nonspecific, but likely reflects chronic microvascular ischemic demyelination. Diffuse loss of parenchymal volume is consistent with atrophy. Prominence of the lateral ventricles is associated with sparing of the temporal tips suggesting ventral megaly related to atrophy. Vascular: No  hyperdense vessel or unexpected calcification. Skull: No evidence for fracture. No worrisome lytic or sclerotic lesion. Sinuses/Orbits: The visualized paranasal sinuses and mastoid air cells are clear. Visualized portions of the globes and intraorbital fat are unremarkable. Other: None. IMPRESSION: 1. Stable exam.  No acute intracranial abnormality. 2. Atrophy with chronic small vessel white matter ischemic disease. Electronically Signed   By: Kennith Center M.D.   On: 07/14/2018 20:29   Ct Head Wo Contrast  Result Date: 07/13/2018 CLINICAL DATA:  Unwitnessed fall EXAM: CT HEAD WITHOUT CONTRAST CT CERVICAL SPINE WITHOUT CONTRAST TECHNIQUE: Multidetector CT imaging of the head and cervical spine was performed following the standard protocol without intravenous contrast. Multiplanar CT image reconstructions of the cervical spine were also generated. COMPARISON:  07/11/2018 head CT FINDINGS: CT HEAD FINDINGS Brain: No acute territorial infarction, hemorrhage or intracranial mass. Moderate small vessel ischemic changes of the white matter. Probable chronic lacunar infarcts within the bilateral basal ganglia. The ventricles are slightly enlarged, likely due to atrophy. Vascular: No hyperdense vessels. Vertebral and  carotid vascular calcification Skull: Normal. Negative for fracture or focal lesion. Sinuses/Orbits: No acute finding. Other: None CT CERVICAL SPINE FINDINGS Alignment: Mild reversal of cervical lordosis. No subluxation. Facet alignment within normal limits. Skull base and vertebrae: No acute fracture. No primary bone lesion or focal pathologic process. Soft tissues and spinal canal: No prevertebral fluid or swelling. No visible canal hematoma. Disc levels: Moderate degenerative changes at C3-C4, C5-C6 and C6-C7 with marked degenerative change at C4-C5. Multiple level facet degenerative changes. Multiple level foraminal narrowing, most significant at C4-C5. Upper chest: Negative. Other: None IMPRESSION: 1.  No CT evidence for acute intracranial abnormality. Small-vessel ischemic changes of the white matter and mild atrophy. 2. Reversal of cervical lordosis with degenerative changes. No acute fracture is seen. Electronically Signed   By: Jasmine Pang M.D.   On: 07/13/2018 18:59   Ct Head Wo Contrast  Result Date: 07/11/2018 CLINICAL DATA:  85 year old male with weakness specifically in legs ongoing for some time. Denies pain. Initial encounter. EXAM: CT HEAD WITHOUT CONTRAST TECHNIQUE: Contiguous axial images were obtained from the base of the skull through the vertex without intravenous contrast. COMPARISON:  None. FINDINGS: Brain: No intracranial hemorrhage. Prominent chronic microvascular changes. This limits evaluation for detection of a small infarct (particularly at the level of the anterior limb of the right internal capsule). No CT evidence of large acute infarct. Remote small basal ganglia infarcts. Moderate global atrophy. Dilated right occipital horn appears long-standing. Prominent size ventricles probably related to central atrophy rather than hydrocephalus. Aqueduct is patent. Slightly hypoplastic inferior vermis. No intracranial mass lesion noted on this unenhanced exam. Vascular: Vascular calcifications.  No hyperdense vessel. Skull: No acute abnormality. Sinuses/Orbits: No acute orbital abnormality. Minimal mucosal thickening right maxillary sinus. Other: Mastoid air cells and middle ear cavities are clear. IMPRESSION: 1. No intracranial hemorrhage. 2. Prominent chronic microvascular changes. This limits evaluation for detection of a small infarct (particularly at the level of the anterior limb of the right internal capsule). No CT evidence of large acute infarct. Remote small basal ganglia infarcts. 3. Moderate global atrophy. Dilated right occipital horn appears long-standing. Prominent size ventricles probably related to central atrophy rather than hydrocephalus. Electronically Signed   By:  Lacy Duverney M.D.   On: 07/11/2018 10:00   Ct Cervical Spine Wo Contrast  Result Date: 07/13/2018 CLINICAL DATA:  Unwitnessed fall EXAM: CT HEAD WITHOUT CONTRAST CT CERVICAL SPINE WITHOUT CONTRAST TECHNIQUE: Multidetector CT imaging of the head and cervical spine was performed following the standard protocol without intravenous contrast. Multiplanar CT image reconstructions of the cervical spine were also generated. COMPARISON:  07/11/2018 head CT FINDINGS: CT HEAD FINDINGS Brain: No acute territorial infarction, hemorrhage or intracranial mass. Moderate small vessel ischemic changes of the white matter. Probable chronic lacunar infarcts within the bilateral basal ganglia. The ventricles are slightly enlarged, likely due to atrophy. Vascular: No hyperdense vessels. Vertebral and carotid vascular calcification Skull: Normal. Negative for fracture or focal lesion. Sinuses/Orbits: No acute finding. Other: None CT CERVICAL SPINE FINDINGS Alignment: Mild reversal of cervical lordosis. No subluxation. Facet alignment within normal limits. Skull base and vertebrae: No acute fracture. No primary bone lesion or focal pathologic process. Soft tissues and spinal canal: No prevertebral fluid or swelling. No visible canal hematoma. Disc levels: Moderate degenerative changes at C3-C4, C5-C6 and C6-C7 with marked degenerative change at C4-C5. Multiple level facet degenerative changes. Multiple level foraminal narrowing, most significant at C4-C5. Upper chest: Negative. Other: None IMPRESSION: 1. No CT evidence for acute  intracranial abnormality. Small-vessel ischemic changes of the white matter and mild atrophy. 2. Reversal of cervical lordosis with degenerative changes. No acute fracture is seen. Electronically Signed   By: Jasmine Pang M.D.   On: 07/13/2018 18:59   Mr Maxine Glenn Head Wo Contrast  Result Date: 07/15/2018 CLINICAL DATA:  Acute pontine infarct. EXAM: MRA NECK WITHOUT AND WITH CONTRAST MRA HEAD WITHOUT CONTRAST  TECHNIQUE: Multiplanar and multiecho pulse sequences of the neck were obtained without and with intravenous contrast. Angiographic images of the neck were obtained using MRA technique without and with intravenous contast.; Angiographic images of the Circle of Willis were obtained using MRA technique without intravenous contrast. CONTRAST:  9 cc Gadavist intravenous COMPARISON:  Noncontrast brain MRI from earlier today FINDINGS: MRA NECK FINDINGS Time-of-flight MRA shows antegrade flow in both carotids and vertebrals. Postcontrast imaging shows a normal diameter arch. Two vessel arch branching pattern. Both carotids are smooth and widely patent. The dominant right vertebral artery is smooth and widely patent. There is prominent atherosclerotic type irregularity of the left vertebral at the V3 segment. On conventional brain MRI there is no evidence of intramural thrombus within the covered vertebral artery. MRA HEAD FINDINGS Right vertebral artery dominance. Fenestration of the proximal basilar. On reformats suboptimal signal in the central lumen of the mid basilar is best attributed artifact (this is at the level of slab interface and there is likely some turbulence of flow related to fenestration). No luminal filling defect is seen on postcontrast neck MRA which also covers this area. More distal to this area is a moderate smooth narrowing. On conventional brain MRI there is no eccentric signal abnormality to suggest a intramural thrombus. Carotid arteries are symmetric with mild siphon irregularity from atherosclerosis. No branch occlusion. No aneurysm. IMPRESSION: Intracranial MRA: Moderate atheromatous type narrowing of the distal basilar. Neck MRA: 1. Prominent focal atherosclerotic type irregularity of the left V3 segment. The dominant right vertebral artery is widely patent. 2. Negative cervical carotids. Electronically Signed   By: Marnee Spring M.D.   On: 07/15/2018 09:28   Mr Maxine Glenn Neck W Wo  Contrast  Result Date: 07/15/2018 CLINICAL DATA:  Acute pontine infarct. EXAM: MRA NECK WITHOUT AND WITH CONTRAST MRA HEAD WITHOUT CONTRAST TECHNIQUE: Multiplanar and multiecho pulse sequences of the neck were obtained without and with intravenous contrast. Angiographic images of the neck were obtained using MRA technique without and with intravenous contast.; Angiographic images of the Circle of Willis were obtained using MRA technique without intravenous contrast. CONTRAST:  9 cc Gadavist intravenous COMPARISON:  Noncontrast brain MRI from earlier today FINDINGS: MRA NECK FINDINGS Time-of-flight MRA shows antegrade flow in both carotids and vertebrals. Postcontrast imaging shows a normal diameter arch. Two vessel arch branching pattern. Both carotids are smooth and widely patent. The dominant right vertebral artery is smooth and widely patent. There is prominent atherosclerotic type irregularity of the left vertebral at the V3 segment. On conventional brain MRI there is no evidence of intramural thrombus within the covered vertebral artery. MRA HEAD FINDINGS Right vertebral artery dominance. Fenestration of the proximal basilar. On reformats suboptimal signal in the central lumen of the mid basilar is best attributed artifact (this is at the level of slab interface and there is likely some turbulence of flow related to fenestration). No luminal filling defect is seen on postcontrast neck MRA which also covers this area. More distal to this area is a moderate smooth narrowing. On conventional brain MRI there is no eccentric signal abnormality to suggest  a intramural thrombus. Carotid arteries are symmetric with mild siphon irregularity from atherosclerosis. No branch occlusion. No aneurysm. IMPRESSION: Intracranial MRA: Moderate atheromatous type narrowing of the distal basilar. Neck MRA: 1. Prominent focal atherosclerotic type irregularity of the left V3 segment. The dominant right vertebral artery is widely  patent. 2. Negative cervical carotids. Electronically Signed   By: Marnee Spring M.D.   On: 07/15/2018 09:28   Mr Brain Wo Contrast  Result Date: 07/15/2018 CLINICAL DATA:  84 y/o M; increased left-sided weakness with trip and left facial droop. EXAM: MRI HEAD WITHOUT CONTRAST TECHNIQUE: Multiplanar, multiecho pulse sequences of the brain and surrounding structures were obtained without intravenous contrast. COMPARISON:  07/14/2018 CT head FINDINGS: Brain: Right paramedian pontine reduced diffusion and T2 FLAIR hyperintensity measuring 18 x 12 mm (AP by ML series 5, image 48) compatible with late acute/early subacute infarction. No associated hemorrhage or mass effect. Stable advanced chronic microvascular ischemic changes and volume loss of the brain. No hydrocephalus, extra-axial collection, effacement of basilar cisterns, focal mass effect, or herniation. Small chronic lacunar infarcts are present within the thalami and lentiform nuclei. Vascular: Normal flow voids. Skull and upper cervical spine: Normal marrow signal. Sinuses/Orbits: Negative. Other: None. IMPRESSION: 1. 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 No associated hemorrhage or mass effect. 2. Stable background of advanced chronic microvascular ischemic changes and volume loss of the brain. These results will be called to the ordering clinician or representative by the Radiologist Assistant, and communication documented in the PACS or zVision Dashboard. Electronically Signed   By: Mitzi Hansen M.D.   On: 07/15/2018 02:48      Huey Bienenstock M.D on 07/16/2018 at 1:53 PM  Between 7am to 7pm - Pager - [phone]  After 7pm go to [email or web address] - password [facility]  [facility] -  Office  [phone]

## 2018-07-17 MED ORDER — METOPROLOL SUCCINATE ER 25 MG PO TB24
12.5000 mg | ORAL_TABLET | Freq: Every day | ORAL | Status: DC
  Administered 2018-07-17 – 2018-07-18 (×2): 12.5 mg via ORAL
  Filled 2018-07-17 (×2): qty 1

## 2018-07-17 NOTE — Clinical Social Work Note (Signed)
Pt's PASRR is pending due to Dementia. PASRR does not due reviews on weekend. Pt can not go to SNF without PASRR.   KermitBridget Carizma [place], [place] [phone]

## 2018-07-17 NOTE — Progress Notes (Addendum)
Pt's son at bedside. States he is not sure that [facility] had his current number. Luisa Hartatrick [phone] stated this was his current number. Denies questions.

## 2018-07-17 NOTE — Evaluation (Signed)
Speech Language Pathology Evaluation Patient Details Name: Ronald Matthews MRN: [id number] DOB: [birth year] Today's Date: 07/17/2018 Time: [id number] SLP Time Calculation (min) (ACUTE ONLY): 24 min  Problem List:  Patient Active Problem List   Diagnosis Date Noted  . Ischemic stroke (HCC) 07/15/2018  . Hypertension 07/15/2018  . Dementia 07/15/2018  . Coronary artery disease 07/15/2018   Past Medical History:  Past Medical History:  Diagnosis Date  . Coronary artery disease   . Dementia   . Hypertension   . Thrombocytopenia (HCC)    Past Surgical History: History reviewed. No pertinent surgical history. HPI:  Pt is an 84 y/o male admitted secondary to L sided weakness, dysarthria, and falls. Imaging revealed R paramedian pontine infarct. PMH includes dementia, CAD, and HTN. NO CXR performed as of yet. No prior ST. SLE ordered.  Assessment / Plan / Recommendation Clinical Impression   Pt administered the [facility] Cognitive Assessment) and this yielded a score of 16/25 (without visual portion d/t not having glasses readily available) with a typical score being 26/30 with deficits in the areas of memory (recall of info), orientation, organization, and attention.  Pt has a baseline dementia, but family wasn't available to state PLOF.  Pt exhibited reduced intelligibility with longer utterances despite cues to increase vocal intensity.  He would utilize compensatory strategies with min verbal cues, but had difficulty completing independently; recommend ST f/u for dysphagia management and educate family/caregivers and pt re: dysarthria tx; thank you for this consult.     SLP Assessment  SLP Recommendation/Assessment: Patient needs continued Speech Language Pathology Services SLP Visit Diagnosis: Dysarthria and anarthria (R47.1)    Follow Up Recommendations  Other (comment)(ALF/ potential HH SLP)    Frequency and Duration min 1 x/week  1 week      SLP Evaluation Cognition  Overall Cognitive Status: No family/caregiver present to determine baseline cognitive functioning Arousal/Alertness: Awake/alert Orientation Level: Oriented to person;Oriented to place;Oriented to situation;Disoriented to time Attention: Sustained Sustained Attention: Impaired Sustained Attention Impairment: Verbal basic;Functional basic Memory: Impaired Memory Impairment: Retrieval deficit;Decreased short term memory Decreased Short Term Memory: Verbal basic;Functional basic Awareness: Appears intact Problem Solving: Appears intact Executive Function: Decision Making Decision Making: Impaired Decision Making Impairment: Verbal basic;Functional basic Behaviors: Impulsive;Perseveration Safety/Judgment: Impaired Comments: (Pt intermittently trying to get out of bed and ringing bell)       Comprehension  Auditory Comprehension Overall Auditory Comprehension: Appears within functional limits for tasks assessed Yes/No Questions: Within Functional Limits Commands: Within Functional Limits(Simple) Conversation: Simple Interfering Components: Attention;Working Radio broadcast assistant: Repetition;Visual/Gestural Nature conservation officer: Within [employer] Reading Comprehension Reading Status: Within funtional limits(environmental signs)    Expression Expression Primary Mode of Expression: Verbal Verbal Expression Overall Verbal Expression: Appears within functional limits for tasks assessed Initiation: No impairment Level of Generative/Spontaneous Verbalization: Conversation Repetition: No impairment Naming: No impairment Pragmatics: No impairment Interfering Components: Attention;Speech intelligibility Non-Verbal Means of Communication: Not applicable Written Expression Dominant Hand: Right Written Expression: Not tested   Oral / Motor  Oral Motor/Sensory Function Overall Oral Motor/Sensory Function: Mild impairment Facial ROM: Within  Functional Limits Facial Symmetry: Within Functional Limits Facial Strength: Within Functional Limits Lingual ROM: Within Functional Limits Lingual Symmetry: Abnormal symmetry left Motor Speech Overall Motor Speech: Appears within functional limits for tasks assessed Respiration: Within functional limits Phonation: Low vocal intensity Resonance: Within functional limits Articulation: Impaired Level of Impairment: Sentence Intelligibility: Intelligibility reduced Word: 50-74% accurate Phrase: 50-74% accurate Sentence: 50-74% accurate Conversation: 50-74% accurate Motor Planning: Witnin functional limits Motor  Speech Errors: Not applicable Effective Techniques: Slow rate;Increased vocal intensity;Pause                      Tressie StalkerPat Adams, M.S., CCC-SLP 07/17/2018, 4:40 PM

## 2018-07-17 NOTE — Progress Notes (Signed)
PROGRESS NOTE                                                                                                                                                                                                             Patient Demographics:    Ronald Matthews, is a 84 y.o. male, DOB - 02/18/1933, KNL:[id number]  Admit date - 07/14/2018   Admitting Physician Briscoe Deutscher, MD  Outpatient Primary MD for the patient is Shelbie Ammons, MD  LOS - 2   Chief Complaint  Patient presents with  . Weakness  . Aphasia  . Gait Problem  . Urinary Incontinence       Brief Narrative     84 y.o. male with medical history significant for dementia, coronary artery disease, and hypertension, now presenting to the emergency department for evaluation of left-sided weakness, dysarthria, and falls. ,  Work-up significant for ischemic CVA   Subjective:    Ronald Matthews today has, No headache, No chest pain, No abdominal pain - No Nausea,    Assessment  & Plan :    Principal Problem:   Ischemic stroke (HCC) Active Problems:   Hypertension   Dementia   Coronary artery disease   Ischemic stroke  -Patient presented with left-sided weakness, slurred speech, not IV TPA candidate due to late presentation, MRI of the head significant for right paramedian pontine subacute infarct, MRA head significant for focal atherosclerosis in left V3 segment, 2D echo with no evidence of embolic source. -LDL is at 129, on  statin -Seen by PT/OT and SLP, recommendation for SNF placement -No antithrombotic before presentation, currently on dual antiplatelet therapy, need to continue aspirin 81 mg and Plavix 75 mg daily for 3 weeks, and then aspirin only. -A1c is 6  Hypertension  - Patient on Toprol, patient had 3.1-second pause yesterday, he is on 12.5 mg at home, he was on 25 mg during hospital stay, I well crease his home back to home dose -Continue with Imdur as well  CAD  - No anginal  complaints   - Continue Imdur , beta-blocker . -Started on statin  Hyperlipidemia -LDL is 129, started on Lipitor      Code Status : Full  Family Communication  : Patient's sister voicemail again today 07/17/2018  Disposition Plan  : SNF, awaiting passar number, hopefully tomorrow as discussed with Child psychotherapist  Consults  :  Neurology  Procedures  : none  DVT Prophylaxis  :  lovenox  Lab Results  Component Value Date   PLT 187 07/16/2018    Antibiotics  :   Anti-infectives (From admission, onward)   None        Objective:   Vitals:   07/17/18 0010 07/17/18 0342 07/17/18 0847 07/17/18 1144  BP: (!) 143/65 137/71 (!) 146/82 (!) 147/80  Pulse: 63 (!) 55 63 72  Resp: 18 20 20 20   Temp: 98.9 F (37.2 C) 97.7 F (36.5 C) 98.1 F (36.7 C) 98 F (36.7 C)  TempSrc: Oral Oral Oral Oral  SpO2: 100% 100% 99% 98%  Weight:      Height:        Wt Readings from Last 3 Encounters:  07/15/18 84 kg  07/13/18 84 kg  07/11/18 84 kg     Intake/Output Summary (Last 24 hours) at 07/17/2018 1213 Last data filed at 07/17/2018 0700 Gross per 24 hour  Intake 440 ml  Output 600 ml  Net -160 ml     Physical Exam  Awake Alert, Oriented x2, with significant left-sided weakness, worse in the upper extremities, no apparent distress .  Has left facial droop Good air entry bilaterally, no wheezing RRR,No Gallops,Rubs or new Murmurs, No Parasternal Heave +ve B.Sounds, Abd Soft, No tenderness, No rebound - guarding or rigidity. No Cyanosis, Clubbing or edema, No new Rash or bruise       Data Review:    CBC Recent Labs  Lab 07/11/18 0913 07/14/18 1734 07/14/18 1814 07/16/18 0631  WBC 4.6 6.7  --  6.6  HGB 13.1 12.5* 13.6 11.6*  HCT 39.3* 39.9 40.0 36.2*  PLT 187 201  --  187  MCV 86.0 88.9  --  86.6  MCH 28.7 27.8  --  27.8  MCHC 33.4 31.3  --  32.0  RDW 14.6* 14.5  --  14.2  LYMPHSABS  --  2.1  --   --   MONOABS  --  0.6  --   --   EOSABS  --  0.2  --   --    BASOSABS  --  0.0  --   --     Chemistries  Recent Labs  Lab 07/11/18 0913 07/14/18 1734 07/14/18 1814 07/16/18 0631  NA 140 142 143 137  K 4.3 4.5 4.6 3.8  CL 107 109 108 106  CO2 26 24  --  22  GLUCOSE 107* 113* 108* 88  BUN 20 17 19 18   CREATININE 0.91 1.03 1.00 0.86  CALCIUM 8.9 9.3  --  8.8*  AST 20 29  --   --   ALT 12 15  --   --   ALKPHOS 51 56  --   --   BILITOT 0.7 0.5  --   --    ------------------------------------------------------------------------------------------------------------------ Recent Labs    07/15/18 0432  CHOL 185  HDL 40*  LDLCALC 129*  TRIG 80  CHOLHDL 4.6    Lab Results  Component Value Date   HGBA1C 6.0 (H) 07/15/2018   ------------------------------------------------------------------------------------------------------------------ No results for input(s): TSH, T4TOTAL, T3FREE, THYROIDAB in the last 72 hours.  Invalid input(s): FREET3 ------------------------------------------------------------------------------------------------------------------ No results for input(s): VITAMINB12, FOLATE, FERRITIN, TIBC, IRON, RETICCTPCT in the last 72 hours.  Coagulation profile Recent Labs  Lab 07/14/18 1734  INR 1.09    No results for input(s): DDIMER in the last 72 hours.  Cardiac Enzymes Recent Labs  Lab 07/11/18 0913  TROPONINI <0.03   ------------------------------------------------------------------------------------------------------------------ No results found for:  BNP  Inpatient Medications  Scheduled Meds: .  stroke: mapping our early stages of recovery book   Does not apply Once  . aspirin EC  81 mg Oral Daily  . atorvastatin  40 mg Oral q1800  . brimonidine  2 drop Both Eyes BID  . clopidogrel  75 mg Oral Daily  . enoxaparin (LOVENOX) injection  40 mg Subcutaneous Daily  . isosorbide mononitrate  15 mg Oral Daily  . multivitamin with minerals  1 tablet Oral Daily  . Travoprost (BAK Free)  1 drop Both Eyes  QHS   Continuous Infusions: . sodium chloride 1,000 mL (07/15/18 2300)   PRN Meds:.acetaminophen **OR** acetaminophen (TYLENOL) oral liquid 160 mg/5 mL **OR** acetaminophen, haloperidol lactate, senna-docusate  Micro Results Recent Results (from the past 240 hour(s))  Urine Culture     Status: Abnormal   Collection Time: 07/11/18  9:13 AM  Result Value Ref Range Status   Specimen Description   Final    URINE, RANDOM Performed at [facility], [address] [place], [place] [address]    Special Requests   Final    NONE Performed at [facility], [address], [place], [place] [address]    Culture MULTIPLE SPECIES PRESENT, SUGGEST RECOLLECTION (A)  Final   Report Status 07/12/2018 FINAL  Final  MRSA PCR Screening     Status: None   Collection Time: 07/16/18  4:02 AM  Result Value Ref Range Status   MRSA by PCR NEGATIVE NEGATIVE Final    Comment:        The GeneXpert MRSA Assay (FDA approved for NASAL specimens only), is one component of a comprehensive MRSA colonization surveillance program. It is not intended to diagnose MRSA infection nor to guide or monitor treatment for MRSA infections. Performed at [facility], [address], [place], [place] [address]     Radiology Reports Ct Head Wo Contrast  Result Date: 07/14/2018 CLINICAL DATA:  Weakness and inability to ambulate. Urinary and fecal incontinence. EXAM: CT HEAD WITHOUT CONTRAST TECHNIQUE: Contiguous axial images were obtained from the base of the skull through the vertex without intravenous contrast. COMPARISON:  07/13/2018 FINDINGS: Brain: There is no evidence for acute hemorrhage, hydrocephalus, mass lesion, or abnormal extra-axial fluid collection. No definite CT evidence for acute infarction. Patchy low attenuation in the deep hemispheric and periventricular white matter is nonspecific, but likely reflects chronic microvascular ischemic demyelination. Diffuse loss of parenchymal  volume is consistent with atrophy. Prominence of the lateral ventricles is associated with sparing of the temporal tips suggesting ventral megaly related to atrophy. Vascular: No hyperdense vessel or unexpected calcification. Skull: No evidence for fracture. No worrisome lytic or sclerotic lesion. Sinuses/Orbits: The visualized paranasal sinuses and mastoid air cells are clear. Visualized portions of the globes and intraorbital fat are unremarkable. Other: None. IMPRESSION: 1. Stable exam.  No acute intracranial abnormality. 2. Atrophy with chronic small vessel white matter ischemic disease. Electronically Signed   By: Kennith Center M.D.   On: 07/14/2018 20:29   Ct Head Wo Contrast  Result Date: 07/13/2018 CLINICAL DATA:  Unwitnessed fall EXAM: CT HEAD WITHOUT CONTRAST CT CERVICAL SPINE WITHOUT CONTRAST TECHNIQUE: Multidetector CT imaging of the head and cervical spine was performed following the standard protocol without intravenous contrast. Multiplanar CT image reconstructions of the cervical spine were also generated. COMPARISON:  07/11/2018 head CT FINDINGS: CT HEAD FINDINGS Brain: No acute territorial infarction, hemorrhage or intracranial mass. Moderate small vessel ischemic changes of the white  matter. Probable chronic lacunar infarcts within the bilateral basal ganglia. The ventricles are slightly enlarged, likely due to atrophy. Vascular: No hyperdense vessels. Vertebral and carotid vascular calcification Skull: Normal. Negative for fracture or focal lesion. Sinuses/Orbits: No acute finding. Other: None CT CERVICAL SPINE FINDINGS Alignment: Mild reversal of cervical lordosis. No subluxation. Facet alignment within normal limits. Skull base and vertebrae: No acute fracture. No primary bone lesion or focal pathologic process. Soft tissues and spinal canal: No prevertebral fluid or swelling. No visible canal hematoma. Disc levels: Moderate degenerative changes at C3-C4, C5-C6 and C6-C7 with marked  degenerative change at C4-C5. Multiple level facet degenerative changes. Multiple level foraminal narrowing, most significant at C4-C5. Upper chest: Negative. Other: None IMPRESSION: 1. No CT evidence for acute intracranial abnormality. Small-vessel ischemic changes of the white matter and mild atrophy. 2. Reversal of cervical lordosis with degenerative changes. No acute fracture is seen. Electronically Signed   By: Jasmine Pang M.D.   On: 07/13/2018 18:59   Ct Head Wo Contrast  Result Date: 07/11/2018 CLINICAL DATA:  84 year old male with weakness specifically in legs ongoing for some time. Denies pain. Initial encounter. EXAM: CT HEAD WITHOUT CONTRAST TECHNIQUE: Contiguous axial images were obtained from the base of the skull through the vertex without intravenous contrast. COMPARISON:  None. FINDINGS: Brain: No intracranial hemorrhage. Prominent chronic microvascular changes. This limits evaluation for detection of a small infarct (particularly at the level of the anterior limb of the right internal capsule). No CT evidence of large acute infarct. Remote small basal ganglia infarcts. Moderate global atrophy. Dilated right occipital horn appears long-standing. Prominent size ventricles probably related to central atrophy rather than hydrocephalus. Aqueduct is patent. Slightly hypoplastic inferior vermis. No intracranial mass lesion noted on this unenhanced exam. Vascular: Vascular calcifications.  No hyperdense vessel. Skull: No acute abnormality. Sinuses/Orbits: No acute orbital abnormality. Minimal mucosal thickening right maxillary sinus. Other: Mastoid air cells and middle ear cavities are clear. IMPRESSION: 1. No intracranial hemorrhage. 2. Prominent chronic microvascular changes. This limits evaluation for detection of a small infarct (particularly at the level of the anterior limb of the right internal capsule). No CT evidence of large acute infarct. Remote small basal ganglia infarcts. 3. Moderate  global atrophy. Dilated right occipital horn appears long-standing. Prominent size ventricles probably related to central atrophy rather than hydrocephalus. Electronically Signed   By: Lacy Duverney M.D.   On: 07/11/2018 10:00   Ct Cervical Spine Wo Contrast  Result Date: 07/13/2018 CLINICAL DATA:  Unwitnessed fall EXAM: CT HEAD WITHOUT CONTRAST CT CERVICAL SPINE WITHOUT CONTRAST TECHNIQUE: Multidetector CT imaging of the head and cervical spine was performed following the standard protocol without intravenous contrast. Multiplanar CT image reconstructions of the cervical spine were also generated. COMPARISON:  07/11/2018 head CT FINDINGS: CT HEAD FINDINGS Brain: No acute territorial infarction, hemorrhage or intracranial mass. Moderate small vessel ischemic changes of the white matter. Probable chronic lacunar infarcts within the bilateral basal ganglia. The ventricles are slightly enlarged, likely due to atrophy. Vascular: No hyperdense vessels. Vertebral and carotid vascular calcification Skull: Normal. Negative for fracture or focal lesion. Sinuses/Orbits: No acute finding. Other: None CT CERVICAL SPINE FINDINGS Alignment: Mild reversal of cervical lordosis. No subluxation. Facet alignment within normal limits. Skull base and vertebrae: No acute fracture. No primary bone lesion or focal pathologic process. Soft tissues and spinal canal: No prevertebral fluid or swelling. No visible canal hematoma. Disc levels: Moderate degenerative changes at C3-C4, C5-C6 and C6-C7 with marked degenerative change at C4-C5.  Multiple level facet degenerative changes. Multiple level foraminal narrowing, most significant at C4-C5. Upper chest: Negative. Other: None IMPRESSION: 1. No CT evidence for acute intracranial abnormality. Small-vessel ischemic changes of the white matter and mild atrophy. 2. Reversal of cervical lordosis with degenerative changes. No acute fracture is seen. Electronically Signed   By: Jasmine Pang M.D.    On: 07/13/2018 18:59   Mr Maxine Glenn Head Wo Contrast  Result Date: 07/15/2018 CLINICAL DATA:  Acute pontine infarct. EXAM: MRA NECK WITHOUT AND WITH CONTRAST MRA HEAD WITHOUT CONTRAST TECHNIQUE: Multiplanar and multiecho pulse sequences of the neck were obtained without and with intravenous contrast. Angiographic images of the neck were obtained using MRA technique without and with intravenous contast.; Angiographic images of the Circle of Willis were obtained using MRA technique without intravenous contrast. CONTRAST:  9 cc Gadavist intravenous COMPARISON:  Noncontrast brain MRI from earlier today FINDINGS: MRA NECK FINDINGS Time-of-flight MRA shows antegrade flow in both carotids and vertebrals. Postcontrast imaging shows a normal diameter arch. Two vessel arch branching pattern. Both carotids are smooth and widely patent. The dominant right vertebral artery is smooth and widely patent. There is prominent atherosclerotic type irregularity of the left vertebral at the V3 segment. On conventional brain MRI there is no evidence of intramural thrombus within the covered vertebral artery. MRA HEAD FINDINGS Right vertebral artery dominance. Fenestration of the proximal basilar. On reformats suboptimal signal in the central lumen of the mid basilar is best attributed artifact (this is at the level of slab interface and there is likely some turbulence of flow related to fenestration). No luminal filling defect is seen on postcontrast neck MRA which also covers this area. More distal to this area is a moderate smooth narrowing. On conventional brain MRI there is no eccentric signal abnormality to suggest a intramural thrombus. Carotid arteries are symmetric with mild siphon irregularity from atherosclerosis. No branch occlusion. No aneurysm. IMPRESSION: Intracranial MRA: Moderate atheromatous type narrowing of the distal basilar. Neck MRA: 1. Prominent focal atherosclerotic type irregularity of the left V3 segment. The  dominant right vertebral artery is widely patent. 2. Negative cervical carotids. Electronically Signed   By: Marnee Spring M.D.   On: 07/15/2018 09:28   Mr Maxine Glenn Neck W Wo Contrast  Result Date: 07/15/2018 CLINICAL DATA:  Acute pontine infarct. EXAM: MRA NECK WITHOUT AND WITH CONTRAST MRA HEAD WITHOUT CONTRAST TECHNIQUE: Multiplanar and multiecho pulse sequences of the neck were obtained without and with intravenous contrast. Angiographic images of the neck were obtained using MRA technique without and with intravenous contast.; Angiographic images of the Circle of Willis were obtained using MRA technique without intravenous contrast. CONTRAST:  9 cc Gadavist intravenous COMPARISON:  Noncontrast brain MRI from earlier today FINDINGS: MRA NECK FINDINGS Time-of-flight MRA shows antegrade flow in both carotids and vertebrals. Postcontrast imaging shows a normal diameter arch. Two vessel arch branching pattern. Both carotids are smooth and widely patent. The dominant right vertebral artery is smooth and widely patent. There is prominent atherosclerotic type irregularity of the left vertebral at the V3 segment. On conventional brain MRI there is no evidence of intramural thrombus within the covered vertebral artery. MRA HEAD FINDINGS Right vertebral artery dominance. Fenestration of the proximal basilar. On reformats suboptimal signal in the central lumen of the mid basilar is best attributed artifact (this is at the level of slab interface and there is likely some turbulence of flow related to fenestration). No luminal filling defect is seen on postcontrast neck MRA which also  covers this area. More distal to this area is a moderate smooth narrowing. On conventional brain MRI there is no eccentric signal abnormality to suggest a intramural thrombus. Carotid arteries are symmetric with mild siphon irregularity from atherosclerosis. No branch occlusion. No aneurysm. IMPRESSION: Intracranial MRA: Moderate atheromatous  type narrowing of the distal basilar. Neck MRA: 1. Prominent focal atherosclerotic type irregularity of the left V3 segment. The dominant right vertebral artery is widely patent. 2. Negative cervical carotids. Electronically Signed   By: Marnee Spring M.D.   On: 07/15/2018 09:28   Mr Brain Wo Contrast  Result Date: 07/15/2018 CLINICAL DATA:  84 y/o M; increased left-sided weakness with trip and left facial droop. EXAM: MRI HEAD WITHOUT CONTRAST TECHNIQUE: Multiplanar, multiecho pulse sequences of the brain and surrounding structures were obtained without intravenous contrast. COMPARISON:  07/14/2018 CT head FINDINGS: Brain: Right paramedian pontine reduced diffusion and T2 FLAIR hyperintensity measuring 18 x 12 mm (AP by ML series 5, image 48) compatible with late acute/early subacute infarction. No associated hemorrhage or mass effect. Stable advanced chronic microvascular ischemic changes and volume loss of the brain. No hydrocephalus, extra-axial collection, effacement of basilar cisterns, focal mass effect, or herniation. Small chronic lacunar infarcts are present within the thalami and lentiform nuclei. Vascular: Normal flow voids. Skull and upper cervical spine: Normal marrow signal. Sinuses/Orbits: Negative. Other: None. IMPRESSION: 1. 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 No associated hemorrhage or mass effect. 2. Stable background of advanced chronic microvascular ischemic changes and volume loss of the brain. These results will be called to the ordering clinician or representative by the Radiologist Assistant, and communication documented in the PACS or zVision Dashboard. Electronically Signed   By: Mitzi Hansen M.D.   On: 07/15/2018 02:48      Huey Bienenstock M.D on 07/17/2018 at 12:13 PM  Between 7am to 7pm - Pager - [phone]  After 7pm go to [email or web address] - password [facility]  [facility] -  Office  [phone]

## 2018-07-18 MED ORDER — CLOPIDOGREL BISULFATE 75 MG PO TABS: 75.0000 mg | ORAL_TABLET | Freq: Every day | ORAL | Status: AC

## 2018-07-18 MED ORDER — ACETAMINOPHEN 325 MG PO TABS: 650.0000 mg | ORAL_TABLET | ORAL | Status: DC | PRN

## 2018-07-18 MED ORDER — ATORVASTATIN CALCIUM 40 MG PO TABS: 40.0000 mg | ORAL_TABLET | Freq: Every day | ORAL | Status: DC

## 2018-07-18 MED ORDER — ASPIRIN 81 MG PO TBEC: 81.0000 mg | DELAYED_RELEASE_TABLET | Freq: Every day | ORAL | Status: DC

## 2018-07-18 NOTE — Discharge Summary (Signed)
Ronald Matthews, is a 84 y.o. male  DOB 14-Jan-1933  MRN [id number].  Admission date:  07/14/2018  Admitting Physician  Briscoe Deutscher, MD  Discharge Date:  07/18/2018   Primary MD  Shelbie Ammons, MD  Recommendations for primary care physician for things to follow:  -Please check CBC, BMP during in 3 days   Admission Diagnosis  Acute embolic stroke [facility]) [I63.9]   Discharge Diagnosis  Acute embolic stroke [facility]) [I63.9]   Principal Problem:   Ischemic stroke [facility]) Active Problems:   Hypertension   Dementia   Coronary artery disease      Past Medical History:  Diagnosis Date  . Coronary artery disease   . Dementia   . Hypertension   . Thrombocytopenia (HCC)     History reviewed. No pertinent surgical history.     History of present illness and  Hospital Course:     Kindly see H&P for history of present illness and admission details, please review complete Labs, Consult reports and Test reports for all details in brief  HPI  from the history and physical done on the day of admission  HPI: Ronald Matthews is a 84 y.o. male with medical history significant for dementia, coronary artery disease, and hypertension, now presenting to the emergency department for evaluation of left-sided weakness, dysarthria, and falls.  Patient is accompanied by his care coordinator from his nursing facility who assists with the history.  He has reportedly been generally weak with poor appetite in recent weeks, has had some recent falls, was evaluated in another emergency department on 07/11/2018 with nonacute head CT.  On 07/13/2018, he was noted to have new left-sided weakness and dysarthria.  He was taken back to the ED at that time, had another head CT and cervical spine CT which were negative for acute findings, and he returned to the nursing home where he has continued to have left-sided weakness and dysarthria.   Patient denies headache, chest pain, palpitations, fevers, or chills.  With his persistent left-sided weakness, he was brought back to the ED tonight for reevaluation.  ED Course: Upon arrival to the ED, patient is found to be afebrile, saturating well on room air, and with vitals otherwise stable.  EKG features a sinus rhythm with first-degree AV nodal block.  Noncontrast head CT was negative for acute findings.  Chemistry panel and CBC unremarkable, INR and troponin normal.  MRI brain was performed and concerning for right paramedian pontine late acute/early subacute infarction without hemorrhage or mass-effect.  Neurology was consulted by the ED physician and recommended medical admission for further evaluation and management.   Hospital Course   84 y.o.malewith medical history significant fordementia, coronary artery disease, and hypertension, now presenting to the emergency department for evaluation of left-sided weakness, dysarthria, and falls. ,  Work-up significant for ischemic CVA  Ischemic stroke -Patient presented with left-sided weakness, slurred speech, not IV TPA candidate due to late presentation, MRI of the head significant for right paramedian pontine subacute infarct, MRA head significant  for focal atherosclerosis in left V3 segment, 2D echo with no evidence of embolic source. -LDL is at 129, on  statin -Seen by PT/OT and SLP, recommendation for SNF placement -No antithrombotic before presentation, currently on dual antiplatelet therapy, need to continue aspirin 81 mg and Plavix 75 mg daily for 3 weeks, and then aspirin only. -A1c is 6  Hypertension -Patient on Toprol, patient had 3.1-second pause on day 1, he is on 12.5 mg at home, he was on 25 mg during hospital stay, his dose was decreased back to 12.5 mg, with no recurrence of pauses. -Continue with Imdur as well  CAD -No anginal complaints -Continue Imdur , beta-blocker . -Started on  statin  Hyperlipidemia -LDL is 129, started on Lipitor     Discharge Condition:  Stable   Follow UP   Contact information for follow-up providers    [facility]. Schedule an appointment as soon as possible for a visit in 4 week(s).   Specialty:  Radiology Contact information: [address] [place] [address] [phone]           Contact information for after-discharge care    Destination    [facility] Preferred SNF .   Service:  Skilled Nursing Contact information: [address] [place] [address] [phone]                    Discharge Instructions  and  Discharge Medications     Discharge Instructions    Ambulatory referral to Neurology   Complete by:  As directed    Follow up with stroke clinic NP (Jessica Vanschaick or Darrol Angel, if both not available, consider Manson Allan, or Ahern) at [facility] in about 4 weeks. Thanks.   Discharge instructions   Complete by:  As directed    Follow with Primary MD Shelbie Ammons, MD OR SNF physician in 3 days  Get CBC, CMP,checked  by Primary MD next visit.    Activity: As tolerated with Full fall precautions use walker/cane & assistance as needed   Disposition SNF   Diet: Heart Healthy, dysphagia 3 with thin liquid diet, with feeding assistance and aspiration precautions.  For Heart failure patients - Check your Weight same time everyday, if you gain over 2 pounds, or you develop in leg swelling, experience more shortness of breath or chest pain, call your Primary MD immediately. Follow Cardiac Low Salt Diet and 1.5 lit/day fluid restriction.   On your next visit with your primary care physician please Get Medicines reviewed and adjusted.   Please request your Prim.MD to go over all Hospital Tests and Procedure/Radiological results at the follow up, please get all Hospital records sent to your Prim MD by  signing hospital release before you go home.   If you experience worsening of your admission symptoms, develop shortness of breath, life threatening emergency, suicidal or homicidal thoughts you must seek medical attention immediately by calling 911 or calling your MD immediately  if symptoms less severe.  You Must read complete instructions/literature along with all the possible adverse reactions/side effects for all the Medicines you take and that have been prescribed to you. Take any new Medicines after you have completely understood and accpet all the possible adverse reactions/side effects.   Do not drive, operating heavy machinery, perform activities at heights, swimming or participation in water activities or provide baby sitting services if your were admitted for syncope or siezures until you  have seen by Primary MD or a Neurologist and advised to do so again.  Do not drive when taking Pain medications.    Do not take more than prescribed Pain, Sleep and Anxiety Medications  Special Instructions: If you have smoked or chewed Tobacco  in the last 2 yrs please stop smoking, stop any regular Alcohol  and or any Recreational drug use.  Wear Seat belts while driving.   Please note  You were cared for by a hospitalist during your hospital stay. If you have any questions about your discharge medications or the care you received while you were in the hospital after you are discharged, you can call the unit and asked to speak with the hospitalist on call if the hospitalist that took care of you is not available. Once you are discharged, your primary care physician will handle any further medical issues. Please note that NO REFILLS for any discharge medications will be authorized once you are discharged, as it is imperative that you return to your primary care physician (or establish a relationship with a primary care physician if you do not have one) for your aftercare needs so that they can  reassess your need for medications and monitor your lab values.   Increase activity slowly   Complete by:  As directed      Allergies as of 07/18/2018      Reactions   Ppd [tuberculin Purified Protein Derivative] Other (See Comments)   "Allergic," per [facility]   Tuberculin Tests Other (See Comments)   "Allergic," per [facility]      Medication List    TAKE these medications   acetaminophen 325 MG tablet Commonly known as:  TYLENOL Take 2 tablets (650 mg total) by mouth every 4 (four) hours as needed for mild pain (or temp > 37.5 C (99.5 F)).   aspirin 81 MG EC tablet Take 1 tablet (81 mg total) by mouth daily. Start taking on:  07/19/2018   atorvastatin 40 MG tablet Commonly known as:  LIPITOR Take 1 tablet (40 mg total) by mouth daily at 6 PM.   brimonidine 0.2 % ophthalmic solution Commonly known as:  ALPHAGAN Place 2 drops into both eyes 2 (two) times daily.   clopidogrel 75 MG tablet Commonly known as:  PLAVIX Take 1 tablet (75 mg total) by mouth daily for 19 days. These take for 3 weeks then stop on 08/07/2018 Start taking on:  07/19/2018   isosorbide mononitrate 30 MG 24 hr tablet Commonly known as:  IMDUR Take 15 mg by mouth daily.   metoprolol succinate 25 MG 24 hr tablet Commonly known as:  TOPROL-XL Take 12.5 mg by mouth daily.   nitroGLYCERIN 0.4 MG SL tablet Commonly known as:  NITROSTAT Place 0.4 mg under the tongue every 5 (five) minutes x 3 doses as needed for chest pain (AND CALL EMS IF NO RESOLUTION).   THEREMS Tabs Take 1 tablet by mouth daily.   travoprost (benzalkonium) 0.004 % ophthalmic solution Commonly known as:  TRAVATAN Place 1 drop into both eyes at bedtime.   white petrolatum Gel Commonly known as:  VASELINE Apply 1 application topically as needed (for dry areas on body).         Diet and Activity recommendation: See Discharge Instructions above   Consults obtained -  Neurology   Major procedures and Radiology Reports - PLEASE review  detailed and final reports for all details, in brief -      Ct Head Wo Contrast  Result Date: 07/14/2018 CLINICAL DATA:  Weakness and inability to ambulate. Urinary and fecal incontinence. EXAM: CT HEAD WITHOUT CONTRAST TECHNIQUE: Contiguous axial images were obtained from the base of the skull through the vertex without intravenous contrast. COMPARISON:  07/13/2018 FINDINGS: Brain: There is no evidence for acute hemorrhage, hydrocephalus, mass lesion, or abnormal extra-axial fluid collection. No definite CT evidence for acute infarction. Patchy low attenuation in the deep hemispheric and periventricular white matter is nonspecific, but likely reflects chronic microvascular ischemic demyelination. Diffuse loss of parenchymal volume is consistent with atrophy. Prominence of the lateral ventricles is associated with sparing of the temporal tips suggesting ventral megaly related to atrophy. Vascular: No hyperdense vessel or unexpected calcification. Skull: No evidence for fracture. No worrisome lytic or sclerotic lesion. Sinuses/Orbits: The visualized paranasal sinuses and mastoid air cells are clear. Visualized portions of the globes and intraorbital fat are unremarkable. Other: None. IMPRESSION: 1. Stable exam.  No acute intracranial abnormality. 2. Atrophy with chronic small vessel white matter ischemic disease. Electronically Signed   By: Kennith Center M.D.   On: 07/14/2018 20:29   Ct Head Wo Contrast  Result Date: 07/13/2018 CLINICAL DATA:  Unwitnessed fall EXAM: CT HEAD WITHOUT CONTRAST CT CERVICAL SPINE WITHOUT CONTRAST TECHNIQUE: Multidetector CT imaging of the head and cervical spine was performed following the standard protocol without intravenous contrast. Multiplanar CT image reconstructions of the cervical spine were also generated. COMPARISON:  07/11/2018 head CT FINDINGS: CT HEAD FINDINGS Brain: No acute territorial infarction, hemorrhage or intracranial mass. Moderate small vessel ischemic  changes of the white matter. Probable chronic lacunar infarcts within the bilateral basal ganglia. The ventricles are slightly enlarged, likely due to atrophy. Vascular: No hyperdense vessels. Vertebral and carotid vascular calcification Skull: Normal. Negative for fracture or focal lesion. Sinuses/Orbits: No acute finding. Other: None CT CERVICAL SPINE FINDINGS Alignment: Mild reversal of cervical lordosis. No subluxation. Facet alignment within normal limits. Skull base and vertebrae: No acute fracture. No primary bone lesion or focal pathologic process. Soft tissues and spinal canal: No prevertebral fluid or swelling. No visible canal hematoma. Disc levels: Moderate degenerative changes at C3-C4, C5-C6 and C6-C7 with marked degenerative change at C4-C5. Multiple level facet degenerative changes. Multiple level foraminal narrowing, most significant at C4-C5. Upper chest: Negative. Other: None IMPRESSION: 1. No CT evidence for acute intracranial abnormality. Small-vessel ischemic changes of the white matter and mild atrophy. 2. Reversal of cervical lordosis with degenerative changes. No acute fracture is seen. Electronically Signed   By: Jasmine Pang M.D.   On: 07/13/2018 18:59   Ct Head Wo Contrast  Result Date: 07/11/2018 CLINICAL DATA:  84 year old male with weakness specifically in legs ongoing for some time. Denies pain. Initial encounter. EXAM: CT HEAD WITHOUT CONTRAST TECHNIQUE: Contiguous axial images were obtained from the base of the skull through the vertex without intravenous contrast. COMPARISON:  None. FINDINGS: Brain: No intracranial hemorrhage. Prominent chronic microvascular changes. This limits evaluation for detection of a small infarct (particularly at the level of the anterior limb of the right internal capsule). No CT evidence of large acute infarct. Remote small basal ganglia infarcts. Moderate global atrophy. Dilated right occipital horn appears long-standing. Prominent size ventricles  probably related to central atrophy rather than hydrocephalus. Aqueduct is patent. Slightly hypoplastic inferior vermis. No intracranial mass lesion noted on this unenhanced exam. Vascular: Vascular calcifications.  No hyperdense vessel. Skull: No acute abnormality. Sinuses/Orbits: No acute orbital abnormality. Minimal mucosal thickening right maxillary sinus. Other: Mastoid air cells and middle ear  cavities are clear. IMPRESSION: 1. No intracranial hemorrhage. 2. Prominent chronic microvascular changes. This limits evaluation for detection of a small infarct (particularly at the level of the anterior limb of the right internal capsule). No CT evidence of large acute infarct. Remote small basal ganglia infarcts. 3. Moderate global atrophy. Dilated right occipital horn appears long-standing. Prominent size ventricles probably related to central atrophy rather than hydrocephalus. Electronically Signed   By: Lacy DuverneySteven  Olson M.D.   On: 07/11/2018 10:00   Ct Cervical Spine Wo Contrast  Result Date: 07/13/2018 CLINICAL DATA:  Unwitnessed fall EXAM: CT HEAD WITHOUT CONTRAST CT CERVICAL SPINE WITHOUT CONTRAST TECHNIQUE: Multidetector CT imaging of the head and cervical spine was performed following the standard protocol without intravenous contrast. Multiplanar CT image reconstructions of the cervical spine were also generated. COMPARISON:  07/11/2018 head CT FINDINGS: CT HEAD FINDINGS Brain: No acute territorial infarction, hemorrhage or intracranial mass. Moderate small vessel ischemic changes of the white matter. Probable chronic lacunar infarcts within the bilateral basal ganglia. The ventricles are slightly enlarged, likely due to atrophy. Vascular: No hyperdense vessels. Vertebral and carotid vascular calcification Skull: Normal. Negative for fracture or focal lesion. Sinuses/Orbits: No acute finding. Other: None CT CERVICAL SPINE FINDINGS Alignment: Mild reversal of cervical lordosis. No subluxation. Facet  alignment within normal limits. Skull base and vertebrae: No acute fracture. No primary bone lesion or focal pathologic process. Soft tissues and spinal canal: No prevertebral fluid or swelling. No visible canal hematoma. Disc levels: Moderate degenerative changes at C3-C4, C5-C6 and C6-C7 with marked degenerative change at C4-C5. Multiple level facet degenerative changes. Multiple level foraminal narrowing, most significant at C4-C5. Upper chest: Negative. Other: None IMPRESSION: 1. No CT evidence for acute intracranial abnormality. Small-vessel ischemic changes of the white matter and mild atrophy. 2. Reversal of cervical lordosis with degenerative changes. No acute fracture is seen. Electronically Signed   By: Jasmine PangKim  Fujinaga M.D.   On: 07/13/2018 18:59   Mr Maxine GlennMra Head Wo Contrast  Result Date: 07/15/2018 CLINICAL DATA:  Acute pontine infarct. EXAM: MRA NECK WITHOUT AND WITH CONTRAST MRA HEAD WITHOUT CONTRAST TECHNIQUE: Multiplanar and multiecho pulse sequences of the neck were obtained without and with intravenous contrast. Angiographic images of the neck were obtained using MRA technique without and with intravenous contast.; Angiographic images of the Circle of Willis were obtained using MRA technique without intravenous contrast. CONTRAST:  9 cc Gadavist intravenous COMPARISON:  Noncontrast brain MRI from earlier today FINDINGS: MRA NECK FINDINGS Time-of-flight MRA shows antegrade flow in both carotids and vertebrals. Postcontrast imaging shows a normal diameter arch. Two vessel arch branching pattern. Both carotids are smooth and widely patent. The dominant right vertebral artery is smooth and widely patent. There is prominent atherosclerotic type irregularity of the left vertebral at the V3 segment. On conventional brain MRI there is no evidence of intramural thrombus within the covered vertebral artery. MRA HEAD FINDINGS Right vertebral artery dominance. Fenestration of the proximal basilar. On reformats  suboptimal signal in the central lumen of the mid basilar is best attributed artifact (this is at the level of slab interface and there is likely some turbulence of flow related to fenestration). No luminal filling defect is seen on postcontrast neck MRA which also covers this area. More distal to this area is a moderate smooth narrowing. On conventional brain MRI there is no eccentric signal abnormality to suggest a intramural thrombus. Carotid arteries are symmetric with mild siphon irregularity from atherosclerosis. No branch occlusion. No aneurysm. IMPRESSION: Intracranial MRA:  Moderate atheromatous type narrowing of the distal basilar. Neck MRA: 1. Prominent focal atherosclerotic type irregularity of the left V3 segment. The dominant right vertebral artery is widely patent. 2. Negative cervical carotids. Electronically Signed   By: Marnee Spring M.D.   On: 07/15/2018 09:28   Mr Maxine Glenn Neck W Wo Contrast  Result Date: 07/15/2018 CLINICAL DATA:  Acute pontine infarct. EXAM: MRA NECK WITHOUT AND WITH CONTRAST MRA HEAD WITHOUT CONTRAST TECHNIQUE: Multiplanar and multiecho pulse sequences of the neck were obtained without and with intravenous contrast. Angiographic images of the neck were obtained using MRA technique without and with intravenous contast.; Angiographic images of the Circle of Willis were obtained using MRA technique without intravenous contrast. CONTRAST:  9 cc Gadavist intravenous COMPARISON:  Noncontrast brain MRI from earlier today FINDINGS: MRA NECK FINDINGS Time-of-flight MRA shows antegrade flow in both carotids and vertebrals. Postcontrast imaging shows a normal diameter arch. Two vessel arch branching pattern. Both carotids are smooth and widely patent. The dominant right vertebral artery is smooth and widely patent. There is prominent atherosclerotic type irregularity of the left vertebral at the V3 segment. On conventional brain MRI there is no evidence of intramural thrombus within the  covered vertebral artery. MRA HEAD FINDINGS Right vertebral artery dominance. Fenestration of the proximal basilar. On reformats suboptimal signal in the central lumen of the mid basilar is best attributed artifact (this is at the level of slab interface and there is likely some turbulence of flow related to fenestration). No luminal filling defect is seen on postcontrast neck MRA which also covers this area. More distal to this area is a moderate smooth narrowing. On conventional brain MRI there is no eccentric signal abnormality to suggest a intramural thrombus. Carotid arteries are symmetric with mild siphon irregularity from atherosclerosis. No branch occlusion. No aneurysm. IMPRESSION: Intracranial MRA: Moderate atheromatous type narrowing of the distal basilar. Neck MRA: 1. Prominent focal atherosclerotic type irregularity of the left V3 segment. The dominant right vertebral artery is widely patent. 2. Negative cervical carotids. Electronically Signed   By: Marnee Spring M.D.   On: 07/15/2018 09:28   Mr Brain Wo Contrast  Result Date: 07/15/2018 CLINICAL DATA:  84 y/o M; increased left-sided weakness with trip and left facial droop. EXAM: MRI HEAD WITHOUT CONTRAST TECHNIQUE: Multiplanar, multiecho pulse sequences of the brain and surrounding structures were obtained without intravenous contrast. COMPARISON:  07/14/2018 CT head FINDINGS: Brain: Right paramedian pontine reduced diffusion and T2 FLAIR hyperintensity measuring 18 x 12 mm (AP by ML series 5, image 48) compatible with late acute/early subacute infarction. No associated hemorrhage or mass effect. Stable advanced chronic microvascular ischemic changes and volume loss of the brain. No hydrocephalus, extra-axial collection, effacement of basilar cisterns, focal mass effect, or herniation. Small chronic lacunar infarcts are present within the thalami and lentiform nuclei. Vascular: Normal flow voids. Skull and upper cervical spine: Normal marrow  signal. Sinuses/Orbits: Negative. Other: None. IMPRESSION: 1. 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 No associated hemorrhage or mass effect. 2. Stable background of advanced chronic microvascular ischemic changes and volume loss of the brain. These results will be called to the ordering clinician or representative by the Radiologist Assistant, and communication documented in the PACS or zVision Dashboard. Electronically Signed   By: Mitzi Hansen M.D.   On: 07/15/2018 02:48    Micro Results    Recent Results (from the past 240 hour(s))  Urine Culture     Status: Abnormal   Collection Time: 07/11/18  9:13 AM  Result Value Ref Range Status   Specimen Description   Final    URINE, RANDOM Performed at [facility], [address] [place], [place] [address]    Special Requests   Final    NONE Performed at [facility], [address] [place], [place] [address]    Culture MULTIPLE SPECIES PRESENT, SUGGEST RECOLLECTION (A)  Final   Report Status 07/12/2018 FINAL  Final  MRSA PCR Screening     Status: None   Collection Time: 07/16/18  4:02 AM  Result Value Ref Range Status   MRSA by PCR NEGATIVE NEGATIVE Final    Comment:        The GeneXpert MRSA Assay (FDA approved for NASAL specimens only), is one component of a comprehensive MRSA colonization surveillance program. It is not intended to diagnose MRSA infection nor to guide or monitor treatment for MRSA infections. Performed at [facility], [address], [place], [place] [address]        Today   Subjective:   Ronald Matthews today has no headache,no chest or abdominal pain, denies any worsening of his weakness .  Objective:   Blood pressure (!) 144/85, pulse 92, temperature 98.3 F (36.8 C), temperature source Oral, resp. rate 18, height 6\' 4"  (1.93 m), weight 84 kg, SpO2 99 %.   Intake/Output Summary (Last 24 hours) at 07/18/2018 1305 Last data filed at  07/18/2018 1100 Gross per 24 hour  Intake 800 ml  Output 600 ml  Net 200 ml    Exam  Awake Alert, Oriented x2, with significant left-sided weakness, worse in the upper extremities, no apparent distress .  Has left facial droop Good air entry bilaterally, no wheezing RRR,No Gallops,Rubs or new Murmurs, No Parasternal Heave +ve B.Sounds, Abd Soft, No tenderness, No rebound - guarding or rigidity. No Cyanosis, Clubbing or edema, No new Rash or bruise   Data Review   CBC w Diff:  Lab Results  Component Value Date   WBC 6.6 07/16/2018   HGB 11.6 (L) 07/16/2018   HCT 36.2 (L) 07/16/2018   PLT 187 07/16/2018   LYMPHOPCT 31 07/14/2018   MONOPCT 9 07/14/2018   EOSPCT 2 07/14/2018   BASOPCT 0 07/14/2018    CMP:  Lab Results  Component Value Date   NA 137 07/16/2018   K 3.8 07/16/2018   CL 106 07/16/2018   CO2 22 07/16/2018   BUN 18 07/16/2018   CREATININE 0.86 07/16/2018   PROT 6.6 07/14/2018   ALBUMIN 4.0 07/14/2018   BILITOT 0.5 07/14/2018   ALKPHOS 56 07/14/2018   AST 29 07/14/2018   ALT 15 07/14/2018  .   Total Time in preparing paper work, data evaluation and todays exam - 35 minutes  Huey Bienenstock M.D on 07/18/2018 at 1:05 PM  [facility]   Office  [phone]

## 2018-07-18 NOTE — Discharge Instructions (Signed)
Follow with Primary MD Shelbie AmmonsHaque, Imran P, MD OR SNF physician in 3 days  Get CBC, CMP,checked  by Primary MD next visit.    Activity: As tolerated with Full fall precautions use walker/cane & assistance as needed   Disposition SNF   Diet: Heart Healthy, dysphagia 3 with thin liquid diet, with feeding assistance and aspiration precautions.  For Heart failure patients - Check your Weight same time everyday, if you gain over 2 pounds, or you develop in leg swelling, experience more shortness of breath or chest pain, call your Primary MD immediately. Follow Cardiac Low Salt Diet and 1.5 lit/day fluid restriction.   On your next visit with your primary care physician please Get Medicines reviewed and adjusted.   Please request your Prim.MD to go over all Hospital Tests and Procedure/Radiological results at the follow up, please get all Hospital records sent to your Prim MD by signing hospital release before you go home.   If you experience worsening of your admission symptoms, develop shortness of breath, life threatening emergency, suicidal or homicidal thoughts you must seek medical attention immediately by calling 911 or calling your MD immediately  if symptoms less severe.  You Must read complete instructions/literature along with all the possible adverse reactions/side effects for all the Medicines you take and that have been prescribed to you. Take any new Medicines after you have completely understood and accpet all the possible adverse reactions/side effects.   Do not drive, operating heavy machinery, perform activities at heights, swimming or participation in water activities or provide baby sitting services if your were admitted for syncope or siezures until you have seen by Primary MD or a Neurologist and advised to do so again.  Do not drive when taking Pain medications.    Do not take more than prescribed Pain, Sleep and Anxiety Medications  Special Instructions: If you have  smoked or chewed Tobacco  in the last 2 yrs please stop smoking, stop any regular Alcohol  and or any Recreational drug use.  Wear Seat belts while driving.   Please note  You were cared for by a hospitalist during your hospital stay. If you have any questions about your discharge medications or the care you received while you were in the hospital after you are discharged, you can call the unit and asked to speak with the hospitalist on call if the hospitalist that took care of you is not available. Once you are discharged, your primary care physician will handle any further medical issues. Please note that NO REFILLS for any discharge medications will be authorized once you are discharged, as it is imperative that you return to your primary care physician (or establish a relationship with a primary care physician if you do not have one) for your aftercare needs so that they can reassess your need for medications and monitor your lab values.

## 2018-07-18 NOTE — Clinical Social Work Placement (Signed)
   CLINICAL SOCIAL WORK PLACEMENT  NOTE  Date:  07/18/2018  Patient Details  Name: Marquita PalmsHerman Stiner MRN: [id number] Date of Birth: 28-Jan-1933  Clinical Social Work is seeking post-discharge placement for this patient at the Skilled  Nursing Facility level of care (*CSW will initial, date and re-position this form in  chart as items are completed):      Patient/family provided with [facility] Clinical Social Work Department's list of facilities offering this level of care within the geographic area requested by the patient (or if unable, by the patient's family).  Yes   Patient/family informed of their freedom to choose among providers that offer the needed level of care, that participate in Medicare, Medicaid or managed care program needed by the patient, have an available bed and are willing to accept the patient.      Patient/family informed of [facility]'s ownership interest in [facility] and [facility], as well as of the fact that they are under no obligation to receive care at these facilities.  PASRR submitted to EDS on       PASRR number received on 07/18/18     Existing PASRR number confirmed on       FL2 transmitted to all facilities in geographic area requested by pt/family on       FL2 transmitted to all facilities within larger geographic area on       Patient informed that his/her managed care company has contracts with or will negotiate with certain facilities, including the following:        Yes   Patient/family informed of bed offers received.  Patient chooses bed at [facility] [place]     Physician recommends and patient chooses bed at      Patient to be transferred to [facility] [place] on 07/18/18.  Patient to be transferred to facility by [facility]     Patient family notified on 07/18/18 of transfer.  Name of family member notified:  Florene Glenammy, Elaine     PHYSICIAN Please prepare prescriptions     Additional Comment:     _______________________________________________ Margarito LinerSarah C Lianah Peed, LCSW 07/18/2018, 2:16 PM

## 2018-07-18 NOTE — Clinical Social Work Note (Signed)
CSW facilitated patient discharge including contacting patient family (ALF caregiver Babette Relicammy and niece, Consuella Loselaine: [phone]) and facility to confirm patient discharge plans. Clinical information faxed to facility and family agreeable with plan. CSW arranged ambulance transport via [facility] to [facility] in [place]. RN to call report prior to discharge [phone]. Ask for [address]. Patient going to room 604).  CSW will sign off for now as social work intervention is no longer needed. Please consult us again if new needs arise.  Ronald Matthews, CSW [phone]

## 2018-07-18 NOTE — Plan of Care (Signed)
Adequate for discharge.

## 2018-07-18 NOTE — Clinical Social Work Note (Addendum)
Requested documents uploaded to [facility] for PASARR review.  Charlynn CourtSarah Haydan Wedig, CSW [phone]  1:26 pm PASARR obtained: [id number]  Charlynn CourtSarah Shields Pautz, CSW [phone]

## 2018-08-18 ENCOUNTER — Telehealth: Payer: Self-pay

## 2018-08-18 ENCOUNTER — Ambulatory Visit: Payer: Self-pay | Admitting: Adult Health

## 2018-08-18 ENCOUNTER — Encounter: Payer: Self-pay | Admitting: Adult Health

## 2018-08-18 NOTE — Progress Notes (Deleted)
[facility] [address] [place]. [place] [address] [phone]       OFFICE FOLLOW UP NOTE  [name] Date of Birth:  [birth year] Medical Record Number:  [id number]   Reason for Referral:  hospital stroke follow up  CHIEF COMPLAINT:  No chief complaint on file.   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 History obtained from *** and chart review. Reviewed all radiology images and labs personally.  [name] is a 84 y.o. male with history of tobacco use CAD, Htn, dementia and thrombocytopenia  who presented with Lt sided weakness, slurred speech and incontinence. He did not receive IV t-PA due to late presentation.  CT head reviewed was negative for acute infarct.  MRI brain reviewed and showed right paramedian pontine late acute/early subacute infarction.  MRA head and neck showed focal arthrosclerotic Irregularity of the basilar artery and left V3 segment.  2D echo showed EF of 55 to 60% without cardiac source of emboli identified.  LDL 129 and recommended initiation of Lipitor 40 mg daily.  HTN stable during admission recommended long-term goal normotensive range.  A1c 6.0.  Patient was not on antithrombotic prior to admission and recommended for DAPT for 3 weeks and aspirin alone.  Patient is a current tobacco user and smoking cessation counseling was provided.  Patient was discharged to SNF for continued deficits and therapies.    ROS:   14 system review of systems performed and negative with exception of ***  PMH:  Past Medical History:  Diagnosis Date  . Coronary artery disease   . Dementia   . Hypertension   . Thrombocytopenia (HCC)     PSH: No past surgical history on file.  Social History:  Social History   Socioeconomic History  . Marital status: Single    Spouse name: Not on file  . Number of children: Not on file  .  Years of education: Not on file  . Highest education level: Not on file  Occupational History  . Not on file  Social Needs  . Financial resource strain: Not on file  . Food insecurity:    Worry: Not on file    Inability: Not on file  . Transportation needs:    Medical: Not on file    Non-medical: Not on file  Tobacco Use  . Smoking status: Current Every Day Smoker  . Smokeless tobacco: Never Used  Substance and Sexual Activity  . Alcohol use: No  . Drug use: No  . Sexual activity: Never  Lifestyle  . Physical activity:    Days per week: Not on file    Minutes per session: Not on file  . Stress: Not on file  Relationships  . Social connections:    Talks on phone: Not on file    Gets together: Not on file    Attends religious service: Not on file    Active member of club or organization: Not on file    Attends meetings of clubs or organizations: Not on file    Relationship status: Not on file  . Intimate partner violence:    Fear of current or ex partner: Not on file    Emotionally abused: Not on file    Physically abused: Not on file    Forced sexual activity: Not on file  Other Topics Concern  . Not on file  Social History  Narrative  . Not on file    Family History: No family history on file.  Medications:   Current Outpatient Medications on File Prior to Visit  Medication Sig Dispense Refill  . acetaminophen (TYLENOL) 325 MG tablet Take 2 tablets (650 mg total) by mouth every 4 (four) hours as needed for mild pain (or temp > 37.5 C (99.5 F)).    Marland Kitchen aspirin EC 81 MG EC tablet Take 1 tablet (81 mg total) by mouth daily.    Marland Kitchen atorvastatin (LIPITOR) 40 MG tablet Take 1 tablet (40 mg total) by mouth daily at 6 PM.    . brimonidine (ALPHAGAN) 0.2 % ophthalmic solution Place 2 drops into both eyes 2 (two) times daily.    . isosorbide mononitrate (IMDUR) 30 MG 24 hr tablet Take 15 mg by mouth daily.    . metoprolol succinate (TOPROL-XL) 25 MG 24 hr tablet Take 12.5 mg by  mouth daily.    . Multiple Vitamin (THEREMS) TABS Take 1 tablet by mouth daily.    . nitroGLYCERIN (NITROSTAT) 0.4 MG SL tablet Place 0.4 mg under the tongue every 5 (five) minutes x 3 doses as needed for chest pain (AND CALL EMS IF NO RESOLUTION).    Marland Kitchen travoprost, benzalkonium, (TRAVATAN) 0.004 % ophthalmic solution Place 1 drop into both eyes at bedtime.    . white petrolatum (VASELINE) GEL Apply 1 application topically as needed (for dry areas on body).     No current facility-administered medications on file prior to visit.     Allergies:   Allergies  Allergen Reactions  . Ppd [Tuberculin Purified Protein Derivative] Other (See Comments)    "Allergic," per MAR  . Tuberculin Tests Other (See Comments)    "Allergic," per [facility]     Physical Exam  There were no vitals filed for this visit. There is no height or weight on file to calculate BMI. No exam data present  General: well developed, well nourished, seated, in no evident distress Head: head normocephalic and atraumatic.   Neck: supple with no carotid or supraclavicular bruits Cardiovascular: regular rate and rhythm, no murmurs Musculoskeletal: no deformity Skin:  no rash/petichiae Vascular:  Normal pulses all extremities  Neurologic Exam Mental Status: Awake and fully alert. Oriented to place and time. Recent and remote memory intact. Attention span, concentration and fund of knowledge appropriate. Mood and affect appropriate.  Cranial Nerves: Fundoscopic exam reveals sharp disc margins. Pupils equal, briskly reactive to light. Extraocular movements full without nystagmus. Visual fields full to confrontation. Hearing intact. Facial sensation intact. Face, tongue, palate moves normally and symmetrically.  Motor: Normal bulk and tone. Normal strength in all tested extremity muscles. Sensory.: intact to touch , pinprick , position and vibratory sensation.  Coordination: Rapid alternating movements normal in all extremities.  Finger-to-nose and heel-to-shin performed accurately bilaterally. Gait and Station: Arises from chair without difficulty. Stance is normal. Gait demonstrates normal stride length and balance . Able to heel, toe and tandem walk without difficulty.  Reflexes: 1+ and symmetric. Toes downgoing.    NIHSS  *** Modified Rankin  ***    Diagnostic Data (Labs, Imaging, Testing)   Ct Head Wo Contrast 07/14/2018 IMPRESSION:  1. Stable exam.  No acute intracranial abnormality.  2. Atrophy with chronic small vessel white matter ischemic disease.   MR MRA head wo contrast Mr Ronald Matthews Neck W Wo Contrast 07/15/2018 IMPRESSION:  Intracranial MRA:  Moderate atheromatous type narrowing of the distal basilar.  Neck MRA:  1. Prominent focal atherosclerotic  type irregularity of the left V3 segment. The dominant right vertebral artery is widely patent.  2. Negative cervical carotids.   Mr Brain Wo Contrast 07/15/2018 IMPRESSION:  1. 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 No associated hemorrhage or mass effect.  2. Stable background of advanced chronic microvascular ischemic changes and volume loss of the brain.   Transthoracic Echocardiogram  07/15/2018 Study Conclusions  - Left ventricle: The cavity size was normal. There was mild concentric hypertrophy. Systolic function was normal. The estimated ejection fraction was in the range of 55% to 60%. Wall motion was normal; there were no regional wall motion abnormalities. Doppler parameters are consistent with abnormal left ventricular relaxation (grade 1 diastolic dysfunction). - Aortic valve: Valve area (VTI): 2.98 cm^2. Valve area (Vmax): 3.15 cm^2. Valve area (Vmean): 2.99 cm^2. - Mitral valve: Valve area by pressure half-time: 2.34 cm^2. - Left atrium: The atrium was mildly dilated.  ASSESSMENT: Ronald Matthews is a 84 y.o. year old male here with right pontine infarct on 07/14/2018 secondary to small vessel  disease. Vascular risk factors include HTN, HLD, tobacco use and CAD.     PLAN: -Continue {anticoagulants:31417}  and ***  for secondary stroke prevention -F/u with PCP regarding your *** management -continue to monitor BP at home -advised to continue to stay active and maintain a healthy diet -Maintain strict control of hypertension with blood pressure goal below 130/90, diabetes with hemoglobin A1c goal below 6.5% and cholesterol with LDL cholesterol (bad cholesterol) goal below 70 mg/dL. I also advised the patient to eat a healthy diet with plenty of whole grains, cereals, fruits and vegetables, exercise regularly and maintain ideal body weight.  Follow up in *** or call earlier if needed   Greater than 50% of time during this 25 minute visit was spent on counseling,explanation of diagnosis of ***, reviewing risk factor management of ***, planning of further management, discussion with patient and family and coordination of care    George Hugh, [facility]  [facility] [address] [place], [place] [address]  Phone [phone] Fax [phone] Note: This document was prepared with digital dictation and possible smart phrase technology. Any transcriptional errors that result from this process are unintentional.

## 2018-08-18 NOTE — Telephone Encounter (Signed)
Patient no show for appt today. 

## 2019-03-27 ENCOUNTER — Emergency Department
Admission: EM | Admit: 2019-03-27 | Discharge: 2019-03-27 | Disposition: A | Discharge status: Home / Self Care | Payer: Medicare Other | Attending: Emergency Medicine | Admitting: Emergency Medicine

## 2019-03-27 ENCOUNTER — Other Ambulatory Visit: Payer: Self-pay

## 2019-03-27 ENCOUNTER — Emergency Department: Payer: Medicare Other

## 2019-03-27 DIAGNOSIS — I10 Essential (primary) hypertension: Secondary | ICD-10-CM | POA: Diagnosis not present

## 2019-03-27 DIAGNOSIS — F0390 Unspecified dementia without behavioral disturbance: Secondary | ICD-10-CM | POA: Diagnosis not present

## 2019-03-27 DIAGNOSIS — R220 Localized swelling, mass and lump, head: Secondary | ICD-10-CM | POA: Insufficient documentation

## 2019-03-27 DIAGNOSIS — I2510 Atherosclerotic heart disease of native coronary artery without angina pectoris: Secondary | ICD-10-CM | POA: Diagnosis not present

## 2019-03-27 DIAGNOSIS — Z87891 Personal history of nicotine dependence: Secondary | ICD-10-CM | POA: Diagnosis not present

## 2019-03-27 DIAGNOSIS — Z7982 Long term (current) use of aspirin: Secondary | ICD-10-CM | POA: Diagnosis not present

## 2019-03-27 DIAGNOSIS — R531 Weakness: Secondary | ICD-10-CM | POA: Diagnosis not present

## 2019-03-27 DIAGNOSIS — Z8673 Personal history of transient ischemic attack (TIA), and cerebral infarction without residual deficits: Secondary | ICD-10-CM | POA: Diagnosis not present

## 2019-03-27 DIAGNOSIS — Z79899 Other long term (current) drug therapy: Secondary | ICD-10-CM | POA: Insufficient documentation

## 2019-03-27 LAB — CBC
HCT: 34.2 % — ABNORMAL LOW (ref 39.0–52.0)
Hemoglobin: 10.7 g/dL — ABNORMAL LOW (ref 13.0–17.0)
MCH: 26.8 pg (ref 26.0–34.0)
MCHC: 31.3 g/dL (ref 30.0–36.0)
MCV: 85.5 fL (ref 80.0–100.0)
Platelets: 223 10*3/uL (ref 150–400)
RBC: 4 MIL/uL — ABNORMAL LOW (ref 4.22–5.81)
RDW: 16.3 % — ABNORMAL HIGH (ref 11.5–15.5)
WBC: 6.1 10*3/uL (ref 4.0–10.5)
nRBC: 0 % (ref 0.0–0.2)

## 2019-03-27 LAB — COMPREHENSIVE METABOLIC PANEL
ALT: 19 U/L (ref 0–44)
AST: 22 U/L (ref 15–41)
Albumin: 3.9 g/dL (ref 3.5–5.0)
Alkaline Phosphatase: 62 U/L (ref 38–126)
Anion gap: 8 (ref 5–15)
BUN: 19 mg/dL (ref 8–23)
CO2: 25 mmol/L (ref 22–32)
Calcium: 8.8 mg/dL — ABNORMAL LOW (ref 8.9–10.3)
Chloride: 107 mmol/L (ref 98–111)
Creatinine, Ser: 0.7 mg/dL (ref 0.61–1.24)
GFR calc Af Amer: >60 mL/min (ref >60)
GFR calc non Af Amer: >60 mL/min (ref >60)
Glucose, Bld: 88 mg/dL (ref 70–99)
Potassium: 4.3 mmol/L (ref 3.5–5.1)
Sodium: 140 mmol/L (ref 135–145)
Total Bilirubin: 0.5 mg/dL (ref 0.3–1.2)
Total Protein: 6.9 g/dL (ref 6.5–8.1)

## 2019-03-27 LAB — TROPONIN I: Troponin I: <0.03 ng/mL (ref <0.03)

## 2019-03-27 MED ORDER — AQUAPHOR EX OINT: TOPICAL_OINTMENT | CUTANEOUS | 0 refills | Status: DC | PRN

## 2019-03-27 NOTE — ED Triage Notes (Signed)
Pt arrived via [facility] from [facility] with facial swelling and righ side weakness. Pt started new medication last week, isosorbide. Pt in no distress.
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
## 2019-03-27 NOTE — ED Notes (Signed)
ED Provider at bedside. 

## 2019-03-27 NOTE — ED Notes (Signed)
Spoke with Karel Jarvis at [employer] who has no other questions about the pt's condition. Informed her that pt will be back shortly.

## 2019-04-17 ENCOUNTER — Encounter: Payer: Self-pay | Admitting: Emergency Medicine

## 2019-04-17 ENCOUNTER — Emergency Department: Payer: Medicare Other

## 2019-04-17 ENCOUNTER — Inpatient Hospital Stay: Payer: Medicare Other

## 2019-04-17 ENCOUNTER — Other Ambulatory Visit: Payer: Self-pay

## 2019-04-17 ENCOUNTER — Inpatient Hospital Stay
Admission: EM | Admit: 2019-04-17 | Discharge: 2019-04-19 | DRG: 057 | Disposition: A | Discharge status: Skilled Nursing Facility | Payer: Medicare Other | Attending: Internal Medicine | Admitting: Internal Medicine

## 2019-04-17 DIAGNOSIS — L03116 Cellulitis of left lower limb: Secondary | ICD-10-CM

## 2019-04-17 DIAGNOSIS — F0390 Unspecified dementia without behavioral disturbance: Secondary | ICD-10-CM | POA: Diagnosis present

## 2019-04-17 DIAGNOSIS — M7989 Other specified soft tissue disorders: Secondary | ICD-10-CM

## 2019-04-17 DIAGNOSIS — I639 Cerebral infarction, unspecified: Secondary | ICD-10-CM | POA: Diagnosis present

## 2019-04-17 DIAGNOSIS — Z7982 Long term (current) use of aspirin: Secondary | ICD-10-CM

## 2019-04-17 DIAGNOSIS — M62422 Contracture of muscle, left upper arm: Secondary | ICD-10-CM | POA: Diagnosis present

## 2019-04-17 DIAGNOSIS — I2510 Atherosclerotic heart disease of native coronary artery without angina pectoris: Secondary | ICD-10-CM | POA: Diagnosis present

## 2019-04-17 DIAGNOSIS — I69354 Hemiplegia and hemiparesis following cerebral infarction affecting left non-dominant side: Principal | ICD-10-CM

## 2019-04-17 DIAGNOSIS — Z888 Allergy status to other drugs, medicaments and biological substances status: Secondary | ICD-10-CM

## 2019-04-17 DIAGNOSIS — F17210 Nicotine dependence, cigarettes, uncomplicated: Secondary | ICD-10-CM | POA: Diagnosis present

## 2019-04-17 DIAGNOSIS — M79605 Pain in left leg: Secondary | ICD-10-CM

## 2019-04-17 DIAGNOSIS — Z79899 Other long term (current) drug therapy: Secondary | ICD-10-CM

## 2019-04-17 DIAGNOSIS — Z20828 Contact with and (suspected) exposure to other viral communicable diseases: Secondary | ICD-10-CM | POA: Diagnosis present

## 2019-04-17 DIAGNOSIS — M62462 Contracture of muscle, left lower leg: Secondary | ICD-10-CM

## 2019-04-17 DIAGNOSIS — I10 Essential (primary) hypertension: Secondary | ICD-10-CM | POA: Diagnosis present

## 2019-04-17 DIAGNOSIS — G459 Transient cerebral ischemic attack, unspecified: Secondary | ICD-10-CM

## 2019-04-17 DIAGNOSIS — I340 Nonrheumatic mitral (valve) insufficiency: Secondary | ICD-10-CM | POA: Diagnosis not present

## 2019-04-17 DIAGNOSIS — M79602 Pain in left arm: Secondary | ICD-10-CM

## 2019-04-17 LAB — CBC WITH DIFFERENTIAL/PLATELET
Abs Immature Granulocytes: 0.02 10*3/uL (ref 0.00–0.07)
Basophils Absolute: 0 10*3/uL (ref 0.0–0.1)
Basophils Relative: 1 %
Eosinophils Absolute: 0.5 10*3/uL (ref 0.0–0.5)
Eosinophils Relative: 7 %
HCT: 32.3 % — ABNORMAL LOW (ref 39.0–52.0)
Hemoglobin: 10.4 g/dL — ABNORMAL LOW (ref 13.0–17.0)
Immature Granulocytes: 0 %
Lymphocytes Relative: 32 %
Lymphs Abs: 2.4 10*3/uL (ref 0.7–4.0)
MCH: 27.2 pg (ref 26.0–34.0)
MCHC: 32.2 g/dL (ref 30.0–36.0)
MCV: 84.3 fL (ref 80.0–100.0)
Monocytes Absolute: 0.8 10*3/uL (ref 0.1–1.0)
Monocytes Relative: 11 %
Neutro Abs: 3.7 10*3/uL (ref 1.7–7.7)
Neutrophils Relative %: 49 %
Platelets: 300 10*3/uL (ref 150–400)
RBC: 3.83 MIL/uL — ABNORMAL LOW (ref 4.22–5.81)
RDW: 16.4 % — ABNORMAL HIGH (ref 11.5–15.5)
WBC: 7.5 10*3/uL (ref 4.0–10.5)
nRBC: 0 % (ref 0.0–0.2)

## 2019-04-17 LAB — BRAIN NATRIURETIC PEPTIDE: B Natriuretic Peptide: 190 pg/mL — ABNORMAL HIGH (ref 0.0–100.0)

## 2019-04-17 LAB — SARS CORONAVIRUS 2 BY RT PCR (HOSPITAL ORDER, PERFORMED IN ~~LOC~~ HOSPITAL LAB): SARS Coronavirus 2: NEGATIVE

## 2019-04-17 LAB — URIC ACID: Uric Acid, Serum: 4.4 mg/dL (ref 3.7–8.6)

## 2019-04-17 LAB — COMPREHENSIVE METABOLIC PANEL
ALT: 32 U/L (ref 0–44)
AST: 35 U/L (ref 15–41)
Albumin: 3.6 g/dL (ref 3.5–5.0)
Alkaline Phosphatase: 68 U/L (ref 38–126)
Anion gap: 9 (ref 5–15)
BUN: 15 mg/dL (ref 8–23)
CO2: 23 mmol/L (ref 22–32)
Calcium: 8.6 mg/dL — ABNORMAL LOW (ref 8.9–10.3)
Chloride: 109 mmol/L (ref 98–111)
Creatinine, Ser: 0.71 mg/dL (ref 0.61–1.24)
GFR calc Af Amer: >60 mL/min (ref >60)
GFR calc non Af Amer: >60 mL/min (ref >60)
Glucose, Bld: 84 mg/dL (ref 70–99)
Potassium: 4.1 mmol/L (ref 3.5–5.1)
Sodium: 141 mmol/L (ref 135–145)
Total Bilirubin: 0.5 mg/dL (ref 0.3–1.2)
Total Protein: 6.1 g/dL — ABNORMAL LOW (ref 6.5–8.1)

## 2019-04-17 LAB — CK: Total CK: 153 U/L (ref 49–397)

## 2019-04-17 LAB — LACTIC ACID, PLASMA: Lactic Acid, Venous: 1.3 mmol/L (ref 0.5–1.9)

## 2019-04-17 LAB — TROPONIN I: Troponin I: <0.03 ng/mL (ref <0.03)

## 2019-04-17 LAB — SEDIMENTATION RATE: Sed Rate: 9 mm/hr (ref 0–20)

## 2019-04-17 MED ORDER — STROKE: EARLY STAGES OF RECOVERY BOOK
Freq: Once | Status: AC
  Administered 2019-04-17

## 2019-04-17 MED ORDER — ENOXAPARIN SODIUM 40 MG/0.4ML ~~LOC~~ SOLN
40.0000 mg | SUBCUTANEOUS | Status: DC
  Administered 2019-04-17 – 2019-04-18 (×2): 40 mg via SUBCUTANEOUS
  Filled 2019-04-17: qty 0.4

## 2019-04-17 MED ORDER — SENNOSIDES-DOCUSATE SODIUM 8.6-50 MG PO TABS: 1.0000 | ORAL_TABLET | Freq: Every evening | ORAL | Status: DC | PRN

## 2019-04-17 MED ORDER — ACETAMINOPHEN 650 MG RE SUPP: 650.0000 mg | RECTAL | Status: DC | PRN

## 2019-04-17 MED ORDER — MORPHINE SULFATE (PF) 2 MG/ML IV SOLN
2.0000 mg | Freq: Once | INTRAVENOUS | Status: AC
  Administered 2019-04-17: 2 mg via INTRAVENOUS
  Filled 2019-04-17: qty 1

## 2019-04-17 MED ORDER — ASPIRIN 300 MG RE SUPP: 300.0000 mg | Freq: Every day | RECTAL | Status: DC

## 2019-04-17 MED ORDER — VANCOMYCIN HCL IN DEXTROSE 1-5 GM/200ML-% IV SOLN
1000.0000 mg | Freq: Once | INTRAVENOUS | Status: AC
  Administered 2019-04-17: 1000 mg via INTRAVENOUS
  Filled 2019-04-17: qty 200

## 2019-04-17 MED ORDER — ACETAMINOPHEN 160 MG/5ML PO SOLN
650.0000 mg | ORAL | Status: DC | PRN
  Filled 2019-04-17: qty 20.3

## 2019-04-17 MED ORDER — ACETAMINOPHEN 325 MG PO TABS: 650.0000 mg | ORAL_TABLET | ORAL | Status: DC | PRN

## 2019-04-17 MED ORDER — CEFAZOLIN SODIUM-DEXTROSE 2-4 GM/100ML-% IV SOLN
2.0000 g | Freq: Three times a day (TID) | INTRAVENOUS | Status: DC
  Administered 2019-04-17 – 2019-04-18 (×4): 2 g via INTRAVENOUS
  Filled 2019-04-17 (×9): qty 100

## 2019-04-17 MED ORDER — SODIUM CHLORIDE 0.9 % IV SOLN
3.0000 g | Freq: Four times a day (QID) | INTRAVENOUS | Status: DC
  Administered 2019-04-17: 3 g via INTRAVENOUS
  Filled 2019-04-17: qty 3

## 2019-04-17 MED ORDER — MORPHINE SULFATE (PF) 2 MG/ML IV SOLN
2.0000 mg | INTRAVENOUS | Status: DC | PRN
  Administered 2019-04-17 – 2019-04-19 (×4): 2 mg via INTRAVENOUS
  Filled 2019-04-17 (×5): qty 1

## 2019-04-17 MED ORDER — ASPIRIN EC 325 MG PO TBEC
325.0000 mg | DELAYED_RELEASE_TABLET | Freq: Every day | ORAL | Status: DC
  Administered 2019-04-17 – 2019-04-19 (×3): 325 mg via ORAL
  Filled 2019-04-17 (×3): qty 1

## 2019-04-17 NOTE — Consult Note (Signed)
Pharmacy Antibiotic Note  Ronald Matthews is a 85 y.o. male admitted on 04/17/2019 with cellulitis.  Pharmacy has been consulted for Unasyn dosing.  Plan: Unasyn 3g q6hours  Height: 6\' 4"  (193 cm) Weight: 180 lb (81.6 kg) IBW/kg (Calculated) : 86.8  Temp (24hrs), Avg:97.8 F (36.6 C), Min:97.8 F (36.6 C), Max:97.8 F (36.6 C)  Estimated Creatinine Clearance: 80.8 mL/min (by C-G formula based on SCr of 0.7 mg/dL).    Allergies  Allergen Reactions  . Ppd [Tuberculin Purified Protein Derivative] Other (See Comments)    "Allergic," per [facility]  . Tuberculin Tests Other (See Comments)    "Allergic," per [facility]    Antimicrobials this admission: Vancomycin 1g x 1 Unasyn 6/15 >>  Dose adjustments this admission: None  Microbiology results: None pending at this time  Thank you for allowing pharmacy to be a part of this patient's care.  Lu Duffel, PharmD, BCPS Clinical Pharmacist 04/17/2019 3:21 PM

## 2019-04-17 NOTE — ED Provider Notes (Signed)
[facility] Emergency Department Provider Note   ____________________________________________   First MD Initiated Contact with Patient 04/17/19 1403     (approximate)  I have reviewed the triage vital signs and the nursing notes.   HISTORY  Chief Complaint Leg Pain   HPI Ronald Matthews is a 85 y.o. male who arrived from nursing home went to fast track and now comes here.  He reportedly usually transfers himself from bed to the wheelchair and back without difficulty.  Patient today has pain and swelling in the left elbow and the left lower leg.  He is holding his left lower leg flexed at the knee and the hip as well.  He says it hurts in the mid femur.  He says he has not been able to straighten out the leg for many years.  Review of his old records show that he was here last month and at that time he was able to straighten out his leg.  Review of old records also shows that he seems to frequently think things have been going on for much longer than they have been.  Patient's left lower leg is swollen red and warm and somewhat tender.  His elbow is also swollen red and tender.  He is not running a fever.  He is not giving me a very good history.         Past Medical History:  Diagnosis Date  . Coronary artery disease   . Dementia (HCC)   . Hypertension   . Thrombocytopenia [facility](HCC)     Patient Active Problem List   Diagnosis Date Noted  . Ischemic stroke (HCC) 07/15/2018  . Hypertension 07/15/2018  . Dementia (HCC) 07/15/2018  . Coronary artery disease 07/15/2018    History reviewed. No pertinent surgical history.  Prior to Admission medications   Medication Sig Start Date End Date Taking? Authorizing Provider  acetaminophen (TYLENOL) 325 MG tablet Take 2 tablets (650 mg total) by mouth every 4 (four) hours as needed for mild pain (or temp > 37.5 C (99.5 F)). 07/18/18   Elgergawy, Leana Roeawood S, MD  aspirin EC 81 MG EC tablet Take 1 tablet (81 mg total) by  mouth daily. 07/19/18   Elgergawy, Leana Roeawood S, MD  atorvastatin (LIPITOR) 40 MG tablet Take 1 tablet (40 mg total) by mouth daily at 6 PM. 07/18/18   Elgergawy, Leana Roeawood S, MD  brimonidine (ALPHAGAN) 0.2 % ophthalmic solution Place 2 drops into both eyes 2 (two) times daily.    [provider]  isosorbide mononitrate (IMDUR) 30 MG 24 hr tablet Take 15 mg by mouth daily.    [provider]  metoprolol succinate (TOPROL-XL) 25 MG 24 hr tablet Take 12.5 mg by mouth daily.    [provider]  mineral oil-hydrophilic petrolatum (AQUAPHOR) ointment Apply topically as needed for dry skin. 03/27/19   Minna AntisPaduchowski, Kevin, MD  Multiple Vitamin (THEREMS) TABS Take 1 tablet by mouth daily.    [provider]  nitroGLYCERIN (NITROSTAT) 0.4 MG SL tablet Place 0.4 mg under the tongue every 5 (five) minutes x 3 doses as needed for chest pain (AND CALL EMS IF NO RESOLUTION).    [provider]  travoprost, benzalkonium, (TRAVATAN) 0.004 % ophthalmic solution Place 1 drop into both eyes at bedtime.    [provider]  white petrolatum (VASELINE) GEL Apply 1 application topically as needed (for dry areas on body).    [provider]    Allergies Ppd [tuberculin purified protein  derivative] and Tuberculin tests  History reviewed. No pertinent family history.  Social History Social History   Tobacco Use  . Smoking status: Current Every Day Smoker  . Smokeless tobacco: Never Used  Substance Use Topics  . Alcohol use: No  . Drug use: No    Review of Systems  Constitutional: No fever/chills Eyes: No visual changes. ENT: No sore throat. Cardiovascular: Denies chest pain. Respiratory: Denies shortness of breath. Gastrointestinal: No abdominal pain.  No nausea, no vomiting.  No diarrhea.  No constipation. Genitourinary: Negative for dysuria. Musculoskeletal: Negative for back pain. Skin: Negative for rash. Neurological: Negative for headaches, focal  weakness   ____________________________________________   PHYSICAL EXAM:  VITAL SIGNS: ED Triage Vitals  Enc Vitals Group     BP 04/17/19 1241 134/61     Pulse Rate 04/17/19 1241 78     Resp 04/17/19 1241 16     Temp 04/17/19 1241 97.8 F (36.6 C)     Temp Source 04/17/19 1241 Oral     SpO2 04/17/19 1241 100 %     Weight 04/17/19 1242 180 lb (81.6 kg)     Height 04/17/19 1242 6\' 4"  (1.93 m)     Head Circumference --      Peak Flow --      Pain Score 04/17/19 1242 7     Pain Loc --      Pain Edu? --      Excl. in [place]? --     Constitutional: Alert and oriented. Well appearing and in no acute distress but does complain of pain in the elbow and the leg.. Eyes: Conjunctivae are normal.  Head: Atraumatic. Nose: No congestion/rhinnorhea. Mouth/Throat: Mucous membranes are moist.  Oropharynx non-erythematous. Neck: No stridor.  Cardiovascular: Normal rate, regular rhythm. Grossly normal heart sounds.  Good peripheral circulation. Respiratory: Normal respiratory effort.  No retractions. Lungs CTAB. Gastrointestinal: Soft and nontender. No distention. No abdominal bruits.  Musculoskeletal: Right leg with trace edema and good range of motion left leg flexed at the hip and knee.  Leg below the knee is red and swollen and warm.  There is no joint effusion in the knee.  Upper leg patient complains of pain in the mid femur but no pain elsewhere.  I am able to move both the knee and the hip through partial range of motion but not enough to lay his leg out straight on the bed.  Right arm looks normal left arm is red and swollen at the elbow also warm at the elbow patient does not want to bend it. Neurologic:  Normal speech and language. No gross focal neurologic deficits are appreciated. No gait instability. Skin:  Skin is warm, dry and intact except C above musculoskeletal. No rash noted.   ____________________________________________   LABS (all labs ordered are listed, but only abnormal  results are displayed)  Labs Reviewed  COMPREHENSIVE METABOLIC PANEL  BRAIN NATRIURETIC PEPTIDE  CBC WITH DIFFERENTIAL/PLATELET  LACTIC ACID, PLASMA  LACTIC ACID, PLASMA  SEDIMENTATION RATE  URIC ACID   ____________________________________________  EKG   ____________________________________________  RADIOLOGY  ED MD interpretation:   Official radiology report(s): Dg Elbow Complete Left  Result Date: 04/17/2019 CLINICAL DATA:  Left elbow swelling.  No known injury. EXAM: LEFT ELBOW - COMPLETE 3+ VIEW COMPARISON:  None. FINDINGS: The mineralization and alignment are normal. There is no evidence of acute fracture or dislocation. The joint spaces are preserved. There is mild spurring of the coronoid process. No significant elbow joint effusion.  There are several calcifications within the soft tissues anteromedial to the distal humerus. These are not typical for sequela of prior avulsion injuries or hydroxyapatite deposition. There is edema throughout the subcutaneous fat. IMPRESSION: No acute osseous findings, significant arthropathic changes or joint effusion. Indeterminate calcifications in the soft tissues of the distal forearm. Electronically Signed   By: Carey BullocksWilliam  Veazey M.D.   On: 04/17/2019 15:06   Dg Tibia/fibula Left  Result Date: 04/17/2019 CLINICAL DATA:  Left leg pain. EXAM: LEFT TIBIA AND FIBULA - 2 VIEW COMPARISON:  None. FINDINGS: There is no evidence of acute fracture or dislocation. Mild soft tissue swelling about the ankle. Vascular calcifications are noted. IMPRESSION: No acute fracture or dislocation identified about the left tibia and fibula. Electronically Signed   By: Ted Mcalpineobrinka  Dimitrova M.D.   On: 04/17/2019 15:02   Dg Foot Complete Left  Result Date: 04/17/2019 CLINICAL DATA:  Left lower leg pain and swelling.  No known injury. EXAM: LEFT FOOT - COMPLETE 3+ VIEW COMPARISON:  None. FINDINGS: The bones are demineralized. No evidence of acute fracture or dislocation.  There are mild degenerative changes in the midfoot and at the 1st metatarsophalangeal joint. The tibial sesamoid of the 1st metatarsal is bipartite. Dorsal forefoot soft tissue swelling is present without evidence of foreign body. There are scattered vascular calcifications. IMPRESSION: No evidence of acute fracture or dislocation. Dorsal forefoot soft tissue swelling. Electronically Signed   By: Carey BullocksWilliam  Veazey M.D.   On: 04/17/2019 15:03    ____________________________________________   PROCEDURES  Procedure(s) performed (including Critical Care):  Procedures   ____________________________________________   INITIAL IMPRESSION / ASSESSMENT AND PLAN / ED COURSE   Ronald Matthews was evaluated in Emergency Department on 04/17/2019 for the symptoms described in the history of present illness. He was evaluated in the context of the global COVID-19 pandemic, which necessitated consideration that the patient might be at risk for infection with the SARS-CoV-2 virus that causes COVID-19. Institutional protocols and algorithms that pertain to the evaluation of patients at risk for COVID-19 are in a state of rapid change based on information released by regulatory bodies including the CDC and federal and state organizations. These policies and algorithms were followed during the patient's care in the ED.    Patient's lab work is not back.  X-rays are not fully resulted and the ultrasounds are not fully resulted either.  Anticipate this patient will have to come in for admission for what is likely cellulitis.  Dr. Lenard LancePaduchowski will follow-up and dispo the patient         ____________________________________________   FINAL CLINICAL IMPRESSION(S) / ED DIAGNOSES  Final diagnoses:  Pain of left upper extremity  Pain of left lower extremity  Swelling of arm  Swelling of lower leg     ED Discharge Orders    None       Note:  This document was prepared using Dragon voice recognition  software and may include unintentional dictation errors.    Arnaldo NatalMalinda,  F, MD 04/17/19 1510

## 2019-04-17 NOTE — ED Notes (Signed)
Moved to room 26  Report called to [employer]

## 2019-04-17 NOTE — ED Triage Notes (Signed)
Brought in via EMS from [place]  Having left leg pain with some upper leg swelling and left arm swelling  Per EMS he usually is able to transfer self from w/c to chair  But this am not able to do

## 2019-04-17 NOTE — ED Notes (Signed)
Patient transported to X-ray 

## 2019-04-17 NOTE — H&P (Signed)
[facility] [place] NAME: Ronald Matthews    MR#:  [id number]  DATE OF BIRTH:  08-31-1933  DATE OF ADMISSION:  04/17/2019  PRIMARY CARE PHYSICIAN: Raelyn Number, MD   REQUESTING/REFERRING PHYSICIAN: Dr. Kerman Passey  CHIEF COMPLAINT:   Chief Complaint  Patient presents with  . Leg Pain    HISTORY OF PRESENT ILLNESS:  Ronald Matthews  is a 85 y.o. male with a known history of hypertension, dementia, CAD, thrombocytopenia presents to the emergency room sent in from his assisted living facility due to left upper and lower extremity pain, weakness and contractions.  Patient normally is able to ambulate with help.  But since earlier in the day patient has had pain and unable to extend his knee and elbow.  Here he has been found to have some swelling and redness around his left lower extremity.  Patient had a CT scan of the head which is negative for stroke.  He is being admitted for left-sided weakness, pain and cellulitis.  Patient is a poor historian. No fall.  PAST MEDICAL HISTORY:   Past Medical History:  Diagnosis Date  . Coronary artery disease   . Dementia ([place])   . Hypertension   . Thrombocytopenia ([place])     PAST SURGICAL HISTORY:  History reviewed. No pertinent surgical history.  SOCIAL HISTORY:   Social History   Tobacco Use  . Smoking status: Current Every Day Smoker  . Smokeless tobacco: Never Used  Substance Use Topics  . Alcohol use: No    FAMILY HISTORY:  History reviewed. No pertinent family history. Unobtainable due to patient's dementia DRUG ALLERGIES:   Allergies  Allergen Reactions  . Ppd [Tuberculin Purified Protein Derivative] Other (See Comments)    "Allergic," per MAR  . Tuberculin Tests Other (See Comments)    "Allergic," per MAR    REVIEW OF SYSTEMS:   Review of Systems  Unable to perform ROS: Dementia    MEDICATIONS AT HOME:   Prior to Admission medications   Medication Sig Start Date End  Date Taking? Authorizing Provider  acetaminophen (TYLENOL) 325 MG tablet Take 2 tablets (650 mg total) by mouth every 4 (four) hours as needed for mild pain (or temp > 37.5 C (99.5 F)). 07/18/18  Yes Elgergawy, Silver Huguenin, MD  alum & mag hydroxide-simeth (MINTOX) 200-200-20 MG/5ML suspension Take 30 mLs by mouth daily as needed for indigestion or heartburn.   Yes [provider]  aspirin EC 81 MG EC tablet Take 1 tablet (81 mg total) by mouth daily. 07/19/18  Yes Elgergawy, Silver Huguenin, MD  atorvastatin (LIPITOR) 40 MG tablet Take 1 tablet (40 mg total) by mouth daily at 6 PM. 07/18/18  Yes Elgergawy, Silver Huguenin, MD  bismuth subsalicylate (PEPTO BISMOL) 262 MG/15ML suspension Take 15 mLs by mouth every 2 (two) hours as needed for indigestion or diarrhea or loose stools (max 6 doses daily).   Yes [provider]  brimonidine (ALPHAGAN) 0.2 % ophthalmic solution Place 2 drops into both eyes 2 (two) times daily.   Yes [provider]  loperamide (IMODIUM) 2 MG capsule Take 2 mg by mouth as needed for diarrhea or loose stools (max 4 doses in 12 hours).   Yes [provider]  magnesium hydroxide (MILK OF MAGNESIA) 400 MG/5ML suspension Take 30 mLs by mouth daily as needed for mild constipation.   Yes [provider]  metoprolol succinate (TOPROL-XL) 25 MG 24 hr tablet Take 25 mg  by mouth daily.    Yes [provider]  mineral oil-hydrophilic petrolatum (AQUAPHOR) ointment Apply topically as needed for dry skin. 03/27/19  Yes Harvest Dark, MD  Multiple Vitamin (THEREMS) TABS Take 1 tablet by mouth daily.   Yes [provider]  nitroGLYCERIN (NITROSTAT) 0.4 MG SL tablet Place 0.4 mg under the tongue every 5 (five) minutes x 3 doses as needed for chest pain (AND CALL EMS IF NO RESOLUTION).   Yes [provider]  Phenylephrine-DM-GG (ROBITUSSIN TO GO COUGH/COLD CF) 5-10-100 MG/5ML LIQD Take 10 mLs by mouth every 6 (six) hours as needed (cough).    Yes [provider]  sodium phosphate (FLEET) 7-19 GM/118ML ENEM Place 1 enema rectally daily as needed (constipation not resolved by milk of mag and prune juice).   Yes [provider]  travoprost, benzalkonium, (TRAVATAN) 0.004 % ophthalmic solution Place 1 drop into both eyes at bedtime.   Yes [provider]  white petrolatum (VASELINE) GEL Apply 1 application topically as needed (for dry areas on body).   Yes [provider]     VITAL SIGNS:  Blood pressure (!) 142/65, pulse 78, temperature 97.8 F (36.6 C), temperature source Oral, resp. rate 16, height '6\' 4"'  (1.93 m), weight 81.6 kg, SpO2 100 %.  PHYSICAL EXAMINATION:  Physical Exam  GENERAL:  85 y.o.-year-old patient lying in the bed with no acute distress.  EYES: Pupils equal, round, reactive to light and accommodation. No scleral icterus. Extraocular muscles intact.  HEENT: Head atraumatic, normocephalic. Oropharynx and nasopharynx clear. No oropharyngeal erythema, moist oral mucosa  NECK:  Supple, no jugular venous distention. No thyroid enlargement, no tenderness.  LUNGS: Normal breath sounds bilaterally, no wheezing, rales, rhonchi. No use of accessory muscles of respiration.  CARDIOVASCULAR: S1, S2 normal. No murmurs, rubs, or gallops.  ABDOMEN: Soft, nontender, nondistended. Bowel sounds present. No organomegaly or mass.  EXTREMITIES: No pedal edema, cyanosis, or clubbing. + 2 pedal & radial pulses b/l.   NEUROLOGIC: Cranial nerves II through XII are intact.  Right upper and lower extremity motor strength is 5/5.  Left upper extremity 4/5.  Able to extend the elbow but has pain.  Right lower extremity difficult to assess due to pain.  Significant contraction at right knee and unable to straighten it. PSYCHIATRIC: The patient is alert and awake. SKIN: No obvious rash, lesion, or ulcer.   LABORATORY PANEL:   CBC Recent Labs  Lab 04/17/19 1610  WBC 7.5  HGB 10.4*  HCT 32.3*  PLT 300    ------------------------------------------------------------------------------------------------------------------  Chemistries  Recent Labs  Lab 04/17/19 1610  NA 141  K 4.1  CL 109  CO2 23  GLUCOSE 84  BUN 15  CREATININE 0.71  CALCIUM 8.6*  AST 35  ALT 32  ALKPHOS 68  BILITOT 0.5   ------------------------------------------------------------------------------------------------------------------  Cardiac Enzymes Recent Labs  Lab 04/17/19 1610  TROPONINI <0.03   ------------------------------------------------------------------------------------------------------------------  RADIOLOGY:  Dg Elbow Complete Left  Result Date: 04/17/2019 CLINICAL DATA:  Left elbow swelling.  No known injury. EXAM: LEFT ELBOW - COMPLETE 3+ VIEW COMPARISON:  None. FINDINGS: The mineralization and alignment are normal. There is no evidence of acute fracture or dislocation. The joint spaces are preserved. There is mild spurring of the coronoid process. No significant elbow joint effusion. There are several calcifications within the soft tissues anteromedial to the distal humerus. These are not typical for sequela of prior avulsion injuries or hydroxyapatite deposition. There is edema throughout the subcutaneous fat. IMPRESSION: No  acute osseous findings, significant arthropathic changes or joint effusion. Indeterminate calcifications in the soft tissues of the distal forearm. Electronically Signed   By: Richardean Sale M.D.   On: 04/17/2019 15:06   Dg Tibia/fibula Left  Result Date: 04/17/2019 CLINICAL DATA:  Left leg pain. EXAM: LEFT TIBIA AND FIBULA - 2 VIEW COMPARISON:  None. FINDINGS: There is no evidence of acute fracture or dislocation. Mild soft tissue swelling about the ankle. Vascular calcifications are noted. IMPRESSION: No acute fracture or dislocation identified about the left tibia and fibula. Electronically Signed   By: Fidela Salisbury M.D.   On: 04/17/2019 15:02   Ct Head Wo  Contrast  Result Date: 04/17/2019 CLINICAL DATA:  New left arm and leg contractures. EXAM: CT HEAD WITHOUT CONTRAST TECHNIQUE: Contiguous axial images were obtained from the base of the skull through the vertex without intravenous contrast. COMPARISON:  Head CT 03/27/2019 FINDINGS: Brain: No evidence of acute infarction, hemorrhage, hydrocephalus, extra-axial collection or mass lesion/mass effect. Advanced deep white matter microangiopathy and moderate brain parenchymal volume loss. Stable encephalomalacia of the right aspect of the pons. Vascular: Calcific atherosclerotic disease at the skull base. Skull: Normal. Negative for fracture or focal lesion. Sinuses/Orbits: No acute finding. Other: None. IMPRESSION: 1. No acute intracranial abnormality. 2. Advanced chronic microvascular disease and moderate brain parenchymal volume loss. 3. 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 Electronically Signed   By: Fidela Salisbury M.D.   On: 04/17/2019 18:08   US Venous Img Lower Unilateral Left  Result Date: 04/17/2019 CLINICAL DATA:  Left lower extremity pain and edema. Evaluate for DVT. EXAM: LEFT LOWER EXTREMITY VENOUS DOPPLER ULTRASOUND TECHNIQUE: Gray-scale sonography with graded compression, as well as color Doppler and duplex ultrasound were performed to evaluate the lower extremity deep venous systems from the level of the common femoral vein and including the common femoral, femoral, profunda femoral, popliteal and calf veins including the posterior tibial, peroneal and gastrocnemius veins when visible. The superficial great saphenous vein was also interrogated. Spectral Doppler was utilized to evaluate flow at rest and with distal augmentation maneuvers in the common femoral, femoral and popliteal veins. COMPARISON:  Left tibia and fibula radiographs-earlier same day FINDINGS: Examination is degraded secondary to patient's lower extremity contractures and inability to  tolerate the examination. Contralateral Common Femoral Vein: Respiratory phasicity is normal and symmetric with the symptomatic side. No evidence of thrombus. Normal compressibility. Common Femoral Vein: No evidence of thrombus. Normal compressibility, respiratory phasicity and response to augmentation. Saphenofemoral Junction: No evidence of thrombus. Normal compressibility and flow on color Doppler imaging. Profunda Femoral Vein: No evidence of thrombus. Normal compressibility and flow on color Doppler imaging. Femoral Vein: Proximal mid aspects of the left femoral vein appear patent. The distal aspect the left femoral vein was not imaged. Popliteal Vein: Unable to be imaged. Calf Veins: Unable to be imaged. Superficial Great Saphenous Vein: No evidence of thrombus. Normal compressibility. Other Findings:  None. IMPRESSION: No evidence of DVT to the mid aspect of the left femoral vein. Patient could not tolerate sonographic evaluation of the distal aspect of the left femoral vein, the popliteal vein or the tibial veins secondary to a combination lower extremity contractures and discomfort. Electronically Signed   By: Sandi Mariscal M.D.   On: 04/17/2019 16:01   US Venous Img Upper Uni Left  Result Date: 04/17/2019 CLINICAL DATA:  Left upper extremity pain and edema. Evaluate for DVT. EXAM: LEFT UPPER EXTREMITY VENOUS DOPPLER ULTRASOUND TECHNIQUE:  Gray-scale sonography with graded compression, as well as color Doppler and duplex ultrasound were performed to evaluate the upper extremity deep venous system from the level of the subclavian vein and including the jugular, axillary, basilic, radial, ulnar and upper cephalic vein. Spectral Doppler was utilized to evaluate flow at rest and with distal augmentation maneuvers. COMPARISON:  None. FINDINGS: Examination is minimally degraded secondary to patient's inability to tolerate exam. Contralateral Subclavian Vein: Respiratory phasicity is normal and symmetric with the  symptomatic side. No evidence of thrombus. Normal compressibility. Internal Jugular Vein: No evidence of thrombus. Normal compressibility, respiratory phasicity and response to augmentation. Subclavian Vein: No evidence of thrombus. Normal compressibility, respiratory phasicity and response to augmentation. Axillary Vein: No evidence of thrombus. Normal compressibility, respiratory phasicity and response to augmentation. Cephalic Vein: No evidence of thrombus. Normal compressibility, respiratory phasicity and response to augmentation. Basilic Vein: No evidence of thrombus. Normal compressibility, respiratory phasicity and response to augmentation. Brachial Veins: No evidence of thrombus. Normal compressibility, respiratory phasicity and response to augmentation. Radial Veins: No evidence of thrombus. Normal compressibility, respiratory phasicity and response to augmentation. Ulnar Veins: No evidence of thrombus. Normal compressibility, respiratory phasicity and response to augmentation. Venous Reflux:  None visualized. Other Findings:  None visualized. IMPRESSION: No evidence of DVT within the left upper extremity. Electronically Signed   By: Sandi Mariscal M.D.   On: 04/17/2019 16:02   Dg Foot Complete Left  Result Date: 04/17/2019 CLINICAL DATA:  Left lower leg pain and swelling.  No known injury. EXAM: LEFT FOOT - COMPLETE 3+ VIEW COMPARISON:  None. FINDINGS: The bones are demineralized. No evidence of acute fracture or dislocation. There are mild degenerative changes in the midfoot and at the 1st metatarsophalangeal joint. The tibial sesamoid of the 1st metatarsal is bipartite. Dorsal forefoot soft tissue swelling is present without evidence of foreign body. There are scattered vascular calcifications. IMPRESSION: No evidence of acute fracture or dislocation. Dorsal forefoot soft tissue swelling. Electronically Signed   By: Richardean Sale M.D.   On: 04/17/2019 15:03     IMPRESSION AND PLAN:   *Left-sided  weakness.  Difficult to say if this is a weakness from his pain or CVA.  CT scan is negative.  Will admit with stroke order set.  Ordered MRI of the brain along with echocardiogram and carotid Dopplers.  Neurology consulted and message sent to the on-call neurologist for tomorrow. Dr. Creig Hines.  Aspirin ordered  *Left lower extremity cellulitis.  Will start patient on IV Ancef.  It is unclear if this is cellulitis or myopathy.  I have ordered CK, ESR, ANA.  We will treat this as cellulitis at this time.  Wait for lab work.  *Hypertension.  Continue home medications  *Dementia.  Monitor for inpatient delirium  DVT prophylaxis with Lovenox  All the records are reviewed and case discussed with ED provider. Management plans discussed with the patient, family and they are in agreement.  CODE STATUS: FULL CODE  TOTAL TIME TAKING CARE OF THIS PATIENT: 40 minutes.   Neita Carp M.D on 04/17/2019 at 7:27 PM  Between 7am to 6pm - Pager - [phone]  After 6pm go to [email or web address] - password [email or web address] [facility]  Office  [phone]  CC: Primary care physician; Raelyn Number, MD  Note: This dictation was prepared with Dragon dictation along with smaller phrase technology. Any transcriptional errors that result from this process are unintentional.

## 2019-04-17 NOTE — Consult Note (Signed)
Pharmacy Antibiotic Note  Ronald Matthews is a 85 y.o. male admitted on 04/17/2019 with cellulitis.  Pharmacy has been consulted for Cefazolin dosing.  Plan: Cefazolin 2g q8 hours  Height: 6\' 4"  (193 cm) Weight: 180 lb (81.6 kg) IBW/kg (Calculated) : 86.8  Temp (24hrs), Avg:97.8 F (36.6 C), Min:97.8 F (36.6 C), Max:97.8 F (36.6 C)  Estimated Creatinine Clearance: 80.8 mL/min (by C-G formula based on SCr of 0.71 mg/dL).    Allergies  Allergen Reactions  . Ppd [Tuberculin Purified Protein Derivative] Other (See Comments)    "Allergic," per MAR  . Tuberculin Tests Other (See Comments)    "Allergic," per [facility]    Antimicrobials this admission: Vancomycin 1g x 1 Unasyn 3g x 1 Cefazolin 6/15 >>  Dose adjustments this admission: None  Microbiology results: None pending at this time  Thank you for allowing pharmacy to be a part of this patient's care.  Lu Duffel, PharmD, BCPS Clinical Pharmacist 04/17/2019 7:23 PM

## 2019-04-17 NOTE — ED Notes (Signed)
Patient brief and bed linen changed. Patient given warm blankets. MD at bedside.

## 2019-04-17 NOTE — ED Notes (Signed)
Spoke with Clancy Gourd, director at [facility] regarding patient condition and plan of care. Informed that we were unsure of dispo plan but will update once it is known.

## 2019-04-17 NOTE — ED Notes (Signed)
ED TO INPATIENT HANDOFF REPORT  ED Nurse Name and Phone #:  Vernona Rieger [phone]  S Name/Age/Gender Ronald Matthews 85 y.o. male Room/Bed: ED26A/ED26A  Code Status   Code Status: Full Code  Home/SNF/Other Skilled nursing facility Patient oriented to: self and situation Is this baseline? Yes   Triage Complete: Triage complete  Chief Complaint leg pain  Triage Note Brought in via EMS from [facility]  Having left leg pain with some upper leg swelling and left arm swelling  Per EMS he usually is able to transfer self from w/c to chair  But this am not able to do   Allergies Allergies  Allergen Reactions  . Ppd [Tuberculin Purified Protein Derivative] Other (See Comments)    "Allergic," per MAR  . Tuberculin Tests Other (See Comments)    "Allergic," per MAR    Level of Care/Admitting Diagnosis ED Disposition    ED Disposition Condition Comment   Admit  Hospital Area: [facility] [[id number]]  Level of Care: Med-Surg [16]  Covid Evaluation: N/A  Diagnosis: CVA (cerebral vascular accident) [facility]) [[id number]]  Admitting Physician: Milagros Loll [[id number]]  Attending Physician: Milagros Loll [[id number]]  Estimated length of stay: past midnight tomorrow  Certification:: I certify this patient will need inpatient services for at least 2 midnights  PT Class (Do Not Modify): Inpatient [101]  PT Acc Code (Do Not Modify): Private [1]       B Medical/Surgery History Past Medical History:  Diagnosis Date  . Coronary artery disease   . Dementia (HCC)   . Hypertension   . Thrombocytopenia (HCC)    History reviewed. No pertinent surgical history.   A IV Location/Drains/Wounds Patient Lines/Drains/Airways Status   Active Line/Drains/Airways    Name:   Placement date:   Placement time:   Site:   Days:   Peripheral IV 04/17/19 Right Antecubital   04/17/19    1618    Antecubital   less than 1          Intake/Output Last 24 hours  Intake/Output Summary (Last 24 hours)  at 04/17/2019 1942 Last data filed at 04/17/2019 1828 Gross per 24 hour  Intake 287.39 ml  Output -  Net 287.39 ml    Labs/Imaging Results for orders placed or performed during the hospital encounter of 04/17/19 (from the past 48 hour(s))  Comprehensive metabolic panel     Status: Abnormal   Collection Time: 04/17/19  4:10 PM  Result Value Ref Range   Sodium 141 135 - 145 mmol/L   Potassium 4.1 3.5 - 5.1 mmol/L   Chloride 109 98 - 111 mmol/L   CO2 23 22 - 32 mmol/L   Glucose, Bld 84 70 - 99 mg/dL   BUN 15 8 - 23 mg/dL   Creatinine, Ser 5.62 0.61 - 1.24 mg/dL   Calcium 8.6 (L) 8.9 - 10.3 mg/dL   Total Protein 6.1 (L) 6.5 - 8.1 g/dL   Albumin 3.6 3.5 - 5.0 g/dL   AST 35 15 - 41 U/L   ALT 32 0 - 44 U/L   Alkaline Phosphatase 68 38 - 126 U/L   Total Bilirubin 0.5 0.3 - 1.2 mg/dL   GFR calc non Af Amer >60 >60 mL/min   GFR calc Af Amer >60 >60 mL/min   Anion gap 9 5 - 15    Comment: Performed at [facility], [address] [place], [place] [address]  Brain natriuretic peptide     Status: Abnormal   Collection Time: 04/17/19  4:10 PM  Result Value Ref Range   B Natriuretic Peptide 190.0 (H) 0.0 - 100.0 pg/mL    Comment: Performed at [facility], [address] [place], [place] [address]  CBC with Differential     Status: Abnormal   Collection Time: 04/17/19  4:10 PM  Result Value Ref Range   WBC 7.5 4.0 - 10.5 K/uL   RBC 3.83 (L) 4.22 - 5.81 MIL/uL   Hemoglobin 10.4 (L) 13.0 - 17.0 g/dL   HCT 91.432.3 (L) 78.239.0 - 95.652.0 %   MCV 84.3 80.0 - 100.0 fL   MCH 27.2 26.0 - 34.0 pg   MCHC 32.2 30.0 - 36.0 g/dL   RDW 21.316.4 (H) 08.611.5 - 57.815.5 %   Platelets 300 150 - 400 K/uL   nRBC 0.0 0.0 - 0.2 %   Neutrophils Relative % 49 %   Neutro Abs 3.7 1.7 - 7.7 K/uL   Lymphocytes Relative 32 %   Lymphs Abs 2.4 0.7 - 4.0 K/uL   Monocytes Relative 11 %   Monocytes Absolute 0.8 0.1 - 1.0 K/uL   Eosinophils Relative 7 %   Eosinophils Absolute 0.5 0.0 - 0.5 K/uL   Basophils  Relative 1 %   Basophils Absolute 0.0 0.0 - 0.1 K/uL   Immature Granulocytes 0 %   Abs Immature Granulocytes 0.02 0.00 - 0.07 K/uL    Comment: Performed at [facility], [address] [place], [place] [address]  Sedimentation rate     Status: None   Collection Time: 04/17/19  4:10 PM  Result Value Ref Range   Sed Rate 9 0 - 20 mm/hr    Comment: Performed at [facility], [address] [place], [place] [address]  Uric acid     Status: None   Collection Time: 04/17/19  4:10 PM  Result Value Ref Range   Uric Acid, Serum 4.4 3.7 - 8.6 mg/dL    Comment: Performed at [facility], [address] [place], [place] [address]  Troponin I - Add-On to previous collection     Status: None   Collection Time: 04/17/19  4:10 PM  Result Value Ref Range   Troponin I <0.03 <0.03 ng/mL    Comment: Performed at [facility], [address] [place], [place] [address]  Lactic acid, plasma     Status: None   Collection Time: 04/17/19  5:12 PM  Result Value Ref Range   Lactic Acid, Venous 1.3 0.5 - 1.9 mmol/L    Comment: Performed at [facility], [address] [place], [place] [address]   Dg Elbow Complete Left  Result Date: 04/17/2019 CLINICAL DATA:  Left elbow swelling.  No known injury. EXAM: LEFT ELBOW - COMPLETE 3+ VIEW COMPARISON:  None. FINDINGS: The mineralization and alignment are normal. There is no evidence of acute fracture or dislocation. The joint spaces are preserved. There is mild spurring of the coronoid process. No significant elbow joint effusion. There are several calcifications within the soft tissues anteromedial to the distal humerus. These are not typical for sequela of prior avulsion injuries or hydroxyapatite deposition. There is edema throughout the subcutaneous fat. IMPRESSION: No acute osseous findings, significant arthropathic changes or joint effusion. Indeterminate calcifications in the soft tissues of the distal forearm.  Electronically Signed   By: Carey BullocksWilliam  Veazey M.D.   On: 04/17/2019 15:06   Dg Tibia/fibula Left  Result Date: 04/17/2019 CLINICAL DATA:  Left leg pain. EXAM: LEFT TIBIA AND FIBULA - 2 VIEW  COMPARISON:  None. FINDINGS: There is no evidence of acute fracture or dislocation. Mild soft tissue swelling about the ankle. Vascular calcifications are noted. IMPRESSION: No acute fracture or dislocation identified about the left tibia and fibula. Electronically Signed   By: Ted Mcalpineobrinka  Dimitrova M.D.   On: 04/17/2019 15:02   Ct Head Wo Contrast  Result Date: 04/17/2019 CLINICAL DATA:  New left arm and leg contractures. EXAM: CT HEAD WITHOUT CONTRAST TECHNIQUE: Contiguous axial images were obtained from the base of the skull through the vertex without intravenous contrast. COMPARISON:  Head CT 03/27/2019 FINDINGS: Brain: No evidence of acute infarction, hemorrhage, hydrocephalus, extra-axial collection or mass lesion/mass effect. Advanced deep white matter microangiopathy and moderate brain parenchymal volume loss. Stable encephalomalacia of the right aspect of the pons. Vascular: Calcific atherosclerotic disease at the skull base. Skull: Normal. Negative for fracture or focal lesion. Sinuses/Orbits: No acute finding. Other: None. IMPRESSION: 1. No acute intracranial abnormality. 2. Advanced chronic microvascular disease and moderate brain parenchymal volume loss. 3. 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 Electronically Signed   By: Ted Mcalpineobrinka  Dimitrova M.D.   On: 04/17/2019 18:08   [place] Venous Img Lower Unilateral Left  Result Date: 04/17/2019 CLINICAL DATA:  Left lower extremity pain and edema. Evaluate for DVT. EXAM: LEFT LOWER EXTREMITY VENOUS DOPPLER ULTRASOUND TECHNIQUE: Gray-scale sonography with graded compression, as well as color Doppler and duplex ultrasound were performed to evaluate the lower extremity deep venous systems from the level of the common femoral vein and  including the common femoral, femoral, profunda femoral, popliteal and calf veins including the posterior tibial, peroneal and gastrocnemius veins when visible. The superficial great saphenous vein was also interrogated. Spectral Doppler was utilized to evaluate flow at rest and with distal augmentation maneuvers in the common femoral, femoral and popliteal veins. COMPARISON:  Left tibia and fibula radiographs-earlier same day FINDINGS: Examination is degraded secondary to patient's lower extremity contractures and inability to tolerate the examination. Contralateral Common Femoral Vein: Respiratory phasicity is normal and symmetric with the symptomatic side. No evidence of thrombus. Normal compressibility. Common Femoral Vein: No evidence of thrombus. Normal compressibility, respiratory phasicity and response to augmentation. Saphenofemoral Junction: No evidence of thrombus. Normal compressibility and flow on color Doppler imaging. Profunda Femoral Vein: No evidence of thrombus. Normal compressibility and flow on color Doppler imaging. Femoral Vein: Proximal mid aspects of the left femoral vein appear patent. The distal aspect the left femoral vein was not imaged. Popliteal Vein: Unable to be imaged. Calf Veins: Unable to be imaged. Superficial Great Saphenous Vein: No evidence of thrombus. Normal compressibility. Other Findings:  None. IMPRESSION: No evidence of DVT to the mid aspect of the left femoral vein. Patient could not tolerate sonographic evaluation of the distal aspect of the left femoral vein, the popliteal vein or the tibial veins secondary to a combination lower extremity contractures and discomfort. Electronically Signed   By: Simonne ComeJohn  Watts M.D.   On: 04/17/2019 16:01   [place] Venous Img Upper Uni Left  Result Date: 04/17/2019 CLINICAL DATA:  Left upper extremity pain and edema. Evaluate for DVT. EXAM: LEFT UPPER EXTREMITY VENOUS DOPPLER ULTRASOUND TECHNIQUE: Gray-scale sonography with graded  compression, as well as color Doppler and duplex ultrasound were performed to evaluate the upper extremity deep venous system from the level of the subclavian vein and including the jugular, axillary, basilic, radial, ulnar and upper cephalic vein. Spectral Doppler was utilized to evaluate flow at rest and with distal augmentation maneuvers.  COMPARISON:  None. FINDINGS: Examination is minimally degraded secondary to patient's inability to tolerate exam. Contralateral Subclavian Vein: Respiratory phasicity is normal and symmetric with the symptomatic side. No evidence of thrombus. Normal compressibility. Internal Jugular Vein: No evidence of thrombus. Normal compressibility, respiratory phasicity and response to augmentation. Subclavian Vein: No evidence of thrombus. Normal compressibility, respiratory phasicity and response to augmentation. Axillary Vein: No evidence of thrombus. Normal compressibility, respiratory phasicity and response to augmentation. Cephalic Vein: No evidence of thrombus. Normal compressibility, respiratory phasicity and response to augmentation. Basilic Vein: No evidence of thrombus. Normal compressibility, respiratory phasicity and response to augmentation. Brachial Veins: No evidence of thrombus. Normal compressibility, respiratory phasicity and response to augmentation. Radial Veins: No evidence of thrombus. Normal compressibility, respiratory phasicity and response to augmentation. Ulnar Veins: No evidence of thrombus. Normal compressibility, respiratory phasicity and response to augmentation. Venous Reflux:  None visualized. Other Findings:  None visualized. IMPRESSION: No evidence of DVT within the left upper extremity. Electronically Signed   By: Sandi Mariscal M.D.   On: 04/17/2019 16:02   Dg Foot Complete Left  Result Date: 04/17/2019 CLINICAL DATA:  Left lower leg pain and swelling.  No known injury. EXAM: LEFT FOOT - COMPLETE 3+ VIEW COMPARISON:  None. FINDINGS: The bones are  demineralized. No evidence of acute fracture or dislocation. There are mild degenerative changes in the midfoot and at the 1st metatarsophalangeal joint. The tibial sesamoid of the 1st metatarsal is bipartite. Dorsal forefoot soft tissue swelling is present without evidence of foreign body. There are scattered vascular calcifications. IMPRESSION: No evidence of acute fracture or dislocation. Dorsal forefoot soft tissue swelling. Electronically Signed   By: Richardean Sale M.D.   On: 04/17/2019 15:03    Pending Labs Unresulted Labs (From admission, onward)    Start     Ordered   04/24/19 0500  Creatinine, serum  (enoxaparin (LOVENOX)    CrCl >/= 30 ml/min)  Weekly,   STAT    Comments: while on enoxaparin therapy    04/17/19 1916   04/18/19 0500  Lipid panel  Tomorrow morning,   STAT    Comments: Fasting    04/17/19 1916   04/17/19 1920  CK  Add-on,   AD     04/17/19 1919   04/17/19 1919  ANA w/Reflex if Positive  Add-on,   AD     04/17/19 1919   04/17/19 1915  Hemoglobin A1c  Add-on,   AD     04/17/19 1916          Vitals/Pain Today's Vitals   04/17/19 1242 04/17/19 1700 04/17/19 1701 04/17/19 1800  BP:  (!) 143/75  (!) 142/65  Pulse:   84 78  Resp:      Temp:      TempSrc:      SpO2:   100% 100%  Weight: 81.6 kg     Height: 6\' 4"  (1.93 m)     PainSc: 7        Isolation Precautions No active isolations  Medications Medications   stroke: mapping our early stages of recovery book (has no administration in time range)  acetaminophen (TYLENOL) tablet 650 mg (has no administration in time range)    Or  acetaminophen (TYLENOL) solution 650 mg (has no administration in time range)    Or  acetaminophen (TYLENOL) suppository 650 mg (has no administration in time range)  senna-docusate (Senokot-S) tablet 1 tablet (has no administration in time range)  enoxaparin (LOVENOX) injection 40 mg (has no  administration in time range)  aspirin suppository 300 mg (has no administration  in time range)    Or  aspirin EC tablet 325 mg (has no administration in time range)  ceFAZolin (ANCEF) IVPB 2g/100 mL premix (has no administration in time range)  vancomycin (VANCOCIN) IVPB 1000 mg/200 mL premix ( Intravenous Stopped 04/17/19 1814)  morphine 2 MG/ML injection 2 mg (2 mg Intravenous Given 04/17/19 1841)    Mobility walks with device Moderate fall risk   Focused Assessments pain and swelling in left arm and elbow. no known fall or injury. worsening over last week.   R Recommendations: See Admitting Provider Note  Report given to:   Additional Notes:

## 2019-04-17 NOTE — ED Provider Notes (Signed)
-----------------------------------------   6:20 PM on 04/17/2019 -----------------------------------------  Patient care assumed from Dr. Cinda Quest.  Patient seen and evaluated by myself.  Does have left lower extremity redness with mild swelling.  Patient has significant contracture of the left lower extremity with mild contracture of the left arm.  I discussed with the patient's nurse at [facility] assisted living, she confirms that the contractures started just today.  States previously the patient is able to stand on his own and transfer from his wheelchair to his bed.  Can ambulate short distances with assistance.  Patient is currently on IV antibiotics for presumed left lower extremity cellulitis.  Patient's lab work has resulted largely within normal limits.  Imaging largely within normal limits as well. CT scan of the head shows no acute abnormality.  Patient will be admitted to the hospital service for further treatment.   Harvest Dark, MD 04/17/19 [facility]ald

## 2019-04-17 NOTE — ED Notes (Signed)
Patient transported to MRI 

## 2019-04-18 ENCOUNTER — Inpatient Hospital Stay: Payer: Medicare Other

## 2019-04-18 ENCOUNTER — Inpatient Hospital Stay (HOSPITAL_COMMUNITY)
Admit: 2019-04-18 | Discharge: 2019-04-18 | Disposition: A | Discharge status: Home / Self Care | Payer: Medicare Other | Attending: Internal Medicine | Admitting: Internal Medicine

## 2019-04-18 ENCOUNTER — Encounter: Payer: Self-pay | Admitting: Radiology

## 2019-04-18 DIAGNOSIS — I340 Nonrheumatic mitral (valve) insufficiency: Secondary | ICD-10-CM

## 2019-04-18 LAB — HEMOGLOBIN A1C
Hgb A1c MFr Bld: 5.6 % (ref 4.8–5.6)
Mean Plasma Glucose: 114.02 mg/dL

## 2019-04-18 LAB — ECHOCARDIOGRAM COMPLETE
Height: 76 in
Weight: 2880 oz

## 2019-04-18 LAB — LIPID PANEL
Cholesterol: 109 mg/dL (ref 0–200)
HDL: 40 mg/dL — ABNORMAL LOW (ref >40)
LDL Cholesterol: 60 mg/dL (ref 0–99)
Total CHOL/HDL Ratio: 2.7 RATIO
Triglycerides: 45 mg/dL (ref <150)
VLDL: 9 mg/dL (ref 0–40)

## 2019-04-18 MED ORDER — ATORVASTATIN CALCIUM 20 MG PO TABS: 40.0000 mg | ORAL_TABLET | Freq: Every day | ORAL | Status: DC

## 2019-04-18 MED ORDER — LOPERAMIDE HCL 2 MG PO CAPS: 2.0000 mg | ORAL_CAPSULE | ORAL | Status: DC | PRN

## 2019-04-18 MED ORDER — LATANOPROST 0.005 % OP SOLN
1.0000 [drp] | Freq: Every day | OPHTHALMIC | Status: DC
  Administered 2019-04-18: 23:00:00 1 [drp] via OPHTHALMIC
  Filled 2019-04-18 (×2): qty 2.5

## 2019-04-18 MED ORDER — NITROGLYCERIN 0.4 MG SL SUBL: 0.4000 mg | SUBLINGUAL_TABLET | SUBLINGUAL | Status: DC | PRN

## 2019-04-18 MED ORDER — METOPROLOL SUCCINATE ER 25 MG PO TB24
25.0000 mg | ORAL_TABLET | Freq: Every day | ORAL | Status: DC
  Administered 2019-04-18: 22:00:00 25 mg via ORAL
  Filled 2019-04-18 (×2): qty 1

## 2019-04-18 MED ORDER — MAGNESIUM HYDROXIDE 400 MG/5ML PO SUSP
30.0000 mL | Freq: Every day | ORAL | Status: DC | PRN
  Filled 2019-04-18: qty 30

## 2019-04-18 MED ORDER — IOHEXOL 350 MG/ML SOLN
75.0000 mL | Freq: Once | INTRAVENOUS | Status: AC | PRN
  Administered 2019-04-18: 17:00:00 75 mL via INTRAVENOUS

## 2019-04-18 MED ORDER — BRIMONIDINE TARTRATE 0.2 % OP SOLN
2.0000 [drp] | Freq: Two times a day (BID) | OPHTHALMIC | Status: DC
  Administered 2019-04-18 – 2019-04-19 (×2): 2 [drp] via OPHTHALMIC
  Filled 2019-04-18 (×2): qty 5

## 2019-04-18 NOTE — Progress Notes (Signed)
OT Cancellation Note  Patient Details Name: Ronald Matthews MRN: [id number] DOB: Jul 24, 1933   Cancelled Treatment:    Reason Eval/Treat Not Completed: Patient at procedure or test/ unavailable. Consult received, chart reviewed. Spoke with RN outside pt room. Per RN pt getting an IV placed in preparation for CT Angio. Will re-attempt OT evaluation next date as medically appropriate.   Jeni Salles, MPH, MS, OTR/L ascom [phone] 04/18/19, 4:14 PM

## 2019-04-18 NOTE — TOC Initial Note (Signed)
Transition of Care [facility]) - Initial/Assessment Note    Patient Details  Name: Ruddy Swire MRN: [id number] Date of Birth: [birth year]  Transition of Care [facility]) CM/SW Contact:    Shelbie Hutching, RN Phone Number: 04/18/2019, 4:34 PM  Clinical Narrative:                 Clancy Gourd the Resident Care Director at [facility] called for update on plan of care.  Thayer Headings reports that he has been at [facility] for 11 years.  Patient will likely need short term rehab before returning to ALF.  PT evaluated and reports weaker than baseline and could benefit from SNF.  TOC team will start referral process for SNF.   Expected Discharge Plan: Skilled Nursing Facility Barriers to Discharge: Continued Medical Work up   Patient Goals and CMS Choice Patient states their goals for this hospitalization and ongoing recovery are:: patient is unable to voice goals      Expected Discharge Plan and Services Expected Discharge Plan: Center   Discharge Planning Services: CM Consult   Living arrangements for the past 2 months: [place]                                      Prior Living Arrangements/Services Living arrangements for the past 2 months: [place] Lives with:: Facility Resident Patient language and need for interpreter reviewed:: No Do you feel safe going back to the place where you live?: Yes      Need for Family Participation in Patient Care: Yes (Comment) Care giver support system in place?: Yes (comment)([facility] is his long term residence)   Criminal Activity/Legal Involvement Pertinent to Current Situation/Hospitalization: No - Comment as needed  Activities of Daily Living Home Assistive Devices/Equipment: Wheelchair ADL Screening (condition at time of admission) Patient's cognitive ability adequate to safely complete daily activities?: Yes Is the patient deaf or have difficulty hearing?: Yes Does the patient  have difficulty seeing, even when wearing glasses/contacts?: No Does the patient have difficulty concentrating, remembering, or making decisions?: Yes Patient able to express need for assistance with ADLs?: Yes Does the patient have difficulty dressing or bathing?: Yes Independently performs ADLs?: No Communication: Independent Dressing (OT): Needs assistance Is this a change from baseline?: Pre-admission baseline Grooming: Needs assistance Is this a change from baseline?: Pre-admission baseline Feeding: Independent Bathing: Needs assistance Is this a change from baseline?: Pre-admission baseline Toileting: Needs assistance Is this a change from baseline?: Pre-admission baseline In/Out Bed: Needs assistance Is this a change from baseline?: Pre-admission baseline Walks in Home: Dependent(wheelchair) Is this a change from baseline?: Pre-admission baseline Does the patient have difficulty walking or climbing stairs?: Yes Weakness of Legs: Left Weakness of Arms/Hands: Left  Permission Sought/Granted Permission sought to share information with : Case Manager, Investment banker, corporate granted to share info w AGENCY: [facility]- and any SNF        Emotional Assessment Appearance:: Appears stated age       Alcohol / Substance Use: Not Applicable Psych Involvement: No (comment)  Admission diagnosis:  TIA (transient ischemic attack) [G45.9] CVA (cerebral vascular accident) ([employer]) [I63.9] Swelling of arm [M79.89] Cellulitis of left lower extremity [L03.116] 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 CVA (cerebral vascular accident) (HCC) 04/17/2019  . Ischemic stroke (HCC) 07/15/2018  . Hypertension 07/15/2018  . Dementia (HCC) 07/15/2018  . Coronary artery disease  07/15/2018   PCP:  Shelbie AmmonsHaque, Imran P, MD Pharmacy:  No Pharmacies Listed    Social Determinants of Health (SDOH) Interventions    Readmission Risk Interventions No flowsheet data found.

## 2019-04-18 NOTE — Consult Note (Signed)
Reason for Consult:left arm/leg contracture Referring Physician: Sudini  CC: left arm/leg contracture Patient is not a good historian. HP mostle by chart review HPI: Ronald PalmsHerman Decoster is an 85 y.o. male male with a history of hypertension, dementia, CAD, thrombocytopenia presents to the emergency room sent in from his assisted living facility due to left upper and lower extremity pain, weakness and contractions.  patient also found to have   cellulitis. 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 Per patient, his left arm/leg contracture and pain has been going on for "a long time" and got worse since last week. Denies ha, c/o of pain in left tight. States he can walk with a can, denies nausea/vting, fever. C/o itching in the left forearm( per chart review he carries diagnostic of dermatitis) Head ct in er did not show any acute abnormalities. Labs within nle patient ordered MRI of brain by primary team.    Past Medical History:  Diagnosis Date  . Coronary artery disease   . Dementia (HCC)   . Hypertension   . Thrombocytopenia (HCC)     History reviewed. No pertinent surgical history.  History reviewed. No pertinent family history.  Social History:  reports that he has been smoking. He has never used smokeless tobacco. He reports that he does not drink alcohol or use drugs.  Allergies  Allergen Reactions  . Ppd [Tuberculin Purified Protein Derivative] Other (See Comments)    "Allergic," per MAR  . Tuberculin Tests Other (See Comments)    "Allergic," per MAR    Medications: I have reviewed the patient's current medications.  ROS:  Physical Examination: Blood pressure 132/60, pulse 75, temperature 98.1 F (36.7 C), temperature source Oral, resp. rate 18, height 6\' 4"  (1.93 m), weight 81.6 kg, SpO2 100 %.  Neurologic Examination Patient alert,  awake, oriented x2, speech slightly dysrathric, follows commands CN: PERRLA, EOMI, VFF, no nystgamus, nasolabial flattening on the left( could be body habitus , edentoulous), tongue and palate midline. Motor: 5/5 on RUEX/RLEX, 4/5 on LUEX/LLEX Patient is spastic on LUEX including hand/LLEX, he does have atrophy of thenar/hypothenar and small muscle of left hand. DTR 1+ throughout Sensory exam difficult to assess No coordination deficit apopreciated Gait was not cheked at the time of examination  Results for orders placed or performed during the hospital encounter of 04/17/19 (from the past 48 hour(s))  Comprehensive metabolic panel     Status: Abnormal   Collection Time: 04/17/19  4:10 PM  Result Value Ref Range   Sodium 141 135 - 145 mmol/L   Potassium 4.1 3.5 - 5.1 mmol/L   Chloride 109 98 - 111 mmol/L   CO2 23 22 - 32 mmol/L   Glucose, Bld 84 70 - 99 mg/dL   BUN 15 8 - 23 mg/dL   Creatinine, Ser 1.910.71 0.61 - 1.24 mg/dL   Calcium 8.6 (L) 8.9 - 10.3 mg/dL   Total Protein 6.1 (L) 6.5 - 8.1 g/dL   Albumin 3.6 3.5 - 5.0 g/dL   AST 35 15 - 41 U/L   ALT 32 0 - 44 U/L   Alkaline Phosphatase 68 38 - 126 U/L   Total Bilirubin 0.5 0.3 - 1.2 mg/dL   GFR calc non Af Amer >60 >60 mL/min   GFR calc Af Amer >60 >60 mL/min   Anion gap 9 5 - 15    Comment: Performed  at [facility], [address] [place], [place] [address]  Brain natriuretic peptide     Status: Abnormal   Collection Time: 04/17/19  4:10 PM  Result Value Ref Range   B Natriuretic Peptide 190.0 (H) 0.0 - 100.0 pg/mL    Comment: Performed at [facility], [address] [place], [place] [address]  CBC with Differential     Status: Abnormal   Collection Time: 04/17/19  4:10 PM  Result Value Ref Range   WBC 7.5 4.0 - 10.5 K/uL   RBC 3.83 (L) 4.22 - 5.81 MIL/uL   Hemoglobin 10.4 (L) 13.0 - 17.0 g/dL   HCT 32.3 (L) 39.0 - 52.0 %   MCV 84.3 80.0 - 100.0 fL   MCH 27.2 26.0 - 34.0 pg   MCHC 32.2 30.0 - 36.0  g/dL   RDW 16.4 (H) 11.5 - 15.5 %   Platelets 300 150 - 400 K/uL   nRBC 0.0 0.0 - 0.2 %   Neutrophils Relative % 49 %   Neutro Abs 3.7 1.7 - 7.7 K/uL   Lymphocytes Relative 32 %   Lymphs Abs 2.4 0.7 - 4.0 K/uL   Monocytes Relative 11 %   Monocytes Absolute 0.8 0.1 - 1.0 K/uL   Eosinophils Relative 7 %   Eosinophils Absolute 0.5 0.0 - 0.5 K/uL   Basophils Relative 1 %   Basophils Absolute 0.0 0.0 - 0.1 K/uL   Immature Granulocytes 0 %   Abs Immature Granulocytes 0.02 0.00 - 0.07 K/uL    Comment: Performed at [facility], [address] [place], [place] [address]  Sedimentation rate     Status: None   Collection Time: 04/17/19  4:10 PM  Result Value Ref Range   Sed Rate 9 0 - 20 mm/hr    Comment: Performed at [facility], [address], [place], [place] [address]  Uric acid     Status: None   Collection Time: 04/17/19  4:10 PM  Result Value Ref Range   Uric Acid, Serum 4.4 3.7 - 8.6 mg/dL    Comment: Performed at [facility], [address] [place], [place] [address]  Troponin I - Add-On to previous collection     Status: None   Collection Time: 04/17/19  4:10 PM  Result Value Ref Range   Troponin I <0.03 <0.03 ng/mL    Comment: Performed at [facility], [address] [place], [place] [address]  CK     Status: None   Collection Time: 04/17/19  4:10 PM  Result Value Ref Range   Total CK 153 49 - 397 U/L    Comment: Performed at [facility], [address] [place], [place] [address]  Lactic acid, plasma     Status: None   Collection Time: 04/17/19  5:12 PM  Result Value Ref Range   Lactic Acid, Venous 1.3 0.5 - 1.9 mmol/L    Comment: Performed at [facility], [address] [place], [place] [address]  SARS Coronavirus 2 (CEPHEID - Performed in [place] hospital lab), Hosp Order     Status: None   Collection Time: 04/17/19  8:20 PM   Specimen: Nasopharyngeal Swab  Result Value Ref Range   SARS  Coronavirus 2 NEGATIVE NEGATIVE    Comment: (NOTE) If result is NEGATIVE SARS-CoV-2 target nucleic acids are NOT DETECTED. The SARS-CoV-2 RNA is generally detectable in upper and lower  respiratory specimens during the acute phase of infection. The lowest  concentration of SARS-CoV-2 viral copies this assay can detect is 250  copies / mL. A negative result does not preclude SARS-CoV-2 infection  and should not be used as the sole basis for treatment or other  patient management decisions.  A negative result may occur with  improper specimen collection / handling, submission of specimen other  than nasopharyngeal swab, presence of viral mutation(s) within the  areas targeted by this assay, and inadequate number of viral copies  (<250 copies / mL). A negative result must be combined with clinical  observations, patient history, and epidemiological information. If result is POSITIVE SARS-CoV-2 target nucleic acids are DETECTED. The SARS-CoV-2 RNA is generally detectable in upper and lower  respiratory specimens dur ing the acute phase of infection.  Positive  results are indicative of active infection with SARS-CoV-2.  Clinical  correlation with patient history and other diagnostic information is  necessary to determine patient infection status.  Positive results do  not rule out bacterial infection or co-infection with other viruses. If result is PRESUMPTIVE POSTIVE SARS-CoV-2 nucleic acids MAY BE PRESENT.   A presumptive positive result was obtained on the submitted specimen  and confirmed on repeat testing.  While 2019 novel coronavirus  (SARS-CoV-2) nucleic acids may be present in the submitted sample  additional confirmatory testing may be necessary for epidemiological  and / or clinical management purposes  to differentiate between  SARS-CoV-2 and other Sarbecovirus currently known to infect humans.  If clinically indicated additional testing with an alternate test  methodology  [phone]) is advised. The SARS-CoV-2 RNA is generally  detectable in upper and lower respiratory sp ecimens during the acute  phase of infection. The expected result is Negative. Fact Sheet for Patients:  BoilerBrush.com.cy Fact Sheet for Healthcare Providers: https://pope.com/ This test is not yet approved or cleared by the [place] FDA and has been authorized for detection and/or diagnosis of SARS-CoV-2 by FDA under an Emergency Use Authorization (EUA).  This EUA will remain in effect (meaning this test can be used) for the duration of the COVID-19 declaration under Section 564(b)(1) of the Act, 21 U.S.C. section 360bbb-3(b)(1), unless the authorization is terminated or revoked sooner. Performed at [facility], [address] [place], [place] [address]   Hemoglobin A1c     Status: None   Collection Time: 04/17/19  9:27 PM  Result Value Ref Range   Hgb A1c MFr Bld 5.6 4.8 - 5.6 %    Comment: (NOTE) Pre diabetes:          5.7%-6.4% Diabetes:              >6.4% Glycemic control for   <7.0% adults with diabetes    Mean Plasma Glucose 114.02 mg/dL    Comment: Performed at [facility], [address], [place], [place] [address]  Lipid panel     Status: Abnormal   Collection Time: 04/18/19  2:40 AM  Result Value Ref Range   Cholesterol 109 0 - 200 mg/dL   Triglycerides 45 <478 mg/dL   HDL 40 (L) >29 mg/dL   Total CHOL/HDL Ratio 2.7 RATIO   VLDL 9 0 - 40 mg/dL   LDL Cholesterol 60 0 - 99 mg/dL    Comment:        Total Cholesterol/HDL:CHD Risk Coronary Heart Disease Risk Table                     Men   Women  1/2 Average Risk  3.4   3.3  Average Risk       5.0   4.4  2 X Average Risk   9.6   7.1  3 X Average Risk  23.4   11.0        Use the calculated Patient Ratio above and the CHD Risk Table to determine the patient's CHD Risk.        ATP III CLASSIFICATION (LDL):  <100     mg/dL   Optimal   [id number]  mg/dL   Near or Above                    Optimal  130-159  mg/dL   Borderline  [id number]  mg/dL   High  >811>190     mg/dL   Very High Performed at [facility], [address] [place] [address] [place], [place] [id number]     Recent Results (from the past 240 hour(s))  SARS Coronavirus 2 (CEPHEID - Performed in [facility] lab), Hosp Order     Status: None   Collection Time: 04/17/19  8:20 PM   Specimen: Nasopharyngeal Swab  Result Value Ref Range Status   SARS Coronavirus 2 NEGATIVE NEGATIVE Final    Comment: (NOTE) If result is NEGATIVE SARS-CoV-2 target nucleic acids are NOT DETECTED. The SARS-CoV-2 RNA is generally detectable in upper and lower  respiratory specimens during the acute phase of infection. The lowest  concentration of SARS-CoV-2 viral copies this assay can detect is 250  copies / mL. A negative result does not preclude SARS-CoV-2 infection  and should not be used as the sole basis for treatment or other  patient management decisions.  A negative result may occur with  improper specimen collection / handling, submission of specimen other  than nasopharyngeal swab, presence of viral mutation(s) within the  areas targeted by this assay, and inadequate number of viral copies  (<250 copies / mL). A negative result must be combined with clinical  observations, patient history, and epidemiological information. If result is POSITIVE SARS-CoV-2 target nucleic acids are DETECTED. The SARS-CoV-2 RNA is generally detectable in upper and lower  respiratory specimens dur ing the acute phase of infection.  Positive  results are indicative of active infection with SARS-CoV-2.  Clinical  correlation with patient history and other diagnostic information is  necessary to determine patient infection status.  Positive results do  not rule out bacterial infection or co-infection with other viruses. If result is PRESUMPTIVE POSTIVE SARS-CoV-2 nucleic acids MAY BE PRESENT.   A  presumptive positive result was obtained on the submitted specimen  and confirmed on repeat testing.  While 2019 novel coronavirus  (SARS-CoV-2) nucleic acids may be present in the submitted sample  additional confirmatory testing may be necessary for epidemiological  and / or clinical management purposes  to differentiate between  SARS-CoV-2 and other Sarbecovirus currently known to infect humans.  If clinically indicated additional testing with an alternate test  methodology [phone]([id number]) is advised. The SARS-CoV-2 RNA is generally  detectable in upper and lower respiratory sp ecimens during the acute  phase of infection. The expected result is Negative. Fact Sheet for Patients:  BoilerBrush.com.cyhttps://www.fda.gov/media/136312/download Fact Sheet for Healthcare Providers: https://pope.com/https://www.fda.gov/media/136313/download This test is not yet approved or cleared by the [place] FDA and has been authorized for detection and/or diagnosis of SARS-CoV-2 by FDA under an Emergency Use Authorization (EUA).  This EUA will remain in effect (meaning this test can be used) for the duration of the COVID-19  declaration under Section 564(b)(1) of the Act, 21 U.S.C. section 360bbb-3(b)(1), unless the authorization is terminated or revoked sooner. Performed at [facility], [address] [place], [place] [address]     Dg Elbow Complete Left  Result Date: 04/17/2019 CLINICAL DATA:  Left elbow swelling.  No known injury. EXAM: LEFT ELBOW - COMPLETE 3+ VIEW COMPARISON:  None. FINDINGS: The mineralization and alignment are normal. There is no evidence of acute fracture or dislocation. The joint spaces are preserved. There is mild spurring of the coronoid process. No significant elbow joint effusion. There are several calcifications within the soft tissues anteromedial to the distal humerus. These are not typical for sequela of prior avulsion injuries or hydroxyapatite deposition. There is edema throughout the  subcutaneous fat. IMPRESSION: No acute osseous findings, significant arthropathic changes or joint effusion. Indeterminate calcifications in the soft tissues of the distal forearm. Electronically Signed   By: Carey Bullocks M.D.   On: 04/17/2019 15:06   Dg Tibia/fibula Left  Result Date: 04/17/2019 CLINICAL DATA:  Left leg pain. EXAM: LEFT TIBIA AND FIBULA - 2 VIEW COMPARISON:  None. FINDINGS: There is no evidence of acute fracture or dislocation. Mild soft tissue swelling about the ankle. Vascular calcifications are noted. IMPRESSION: No acute fracture or dislocation identified about the left tibia and fibula. Electronically Signed   By: Ted Mcalpine M.D.   On: 04/17/2019 15:02   Dg Abd 1 View  Result Date: 04/17/2019 CLINICAL DATA:  Stroke EXAM: ABDOMEN - 1 VIEW COMPARISON:  None. FINDINGS: Moderate stool volume. Calcifications in the right upper quadrant are likely gallstones. No dilated small bowel. IMPRESSION: Negative. Electronically Signed   By: Deatra Robinson M.D.   On: 04/17/2019 21:17   Ct Head Wo Contrast  Result Date: 04/17/2019 CLINICAL DATA:  New left arm and leg contractures. EXAM: CT HEAD WITHOUT CONTRAST TECHNIQUE: Contiguous axial images were obtained from the base of the skull through the vertex without intravenous contrast. COMPARISON:  Head CT 03/27/2019 FINDINGS: Brain: No evidence of acute infarction, hemorrhage, hydrocephalus, extra-axial collection or mass lesion/mass effect. Advanced deep white matter microangiopathy and moderate brain parenchymal volume loss. Stable encephalomalacia of the right aspect of the pons. Vascular: Calcific atherosclerotic disease at the skull base. Skull: Normal. Negative for fracture or focal lesion. Sinuses/Orbits: No acute finding. Other: None. IMPRESSION: 1. No acute intracranial abnormality. 2. Advanced chronic microvascular disease and moderate brain parenchymal volume loss. 3. Stable encephalomalacia in the right aspect of the pons post  ischemic infarction. Electronically Signed   By: Ted Mcalpine M.D.   On: 04/17/2019 18:08   US Carotid Bilateral (at [facility] Only)  Result Date: 04/18/2019 CLINICAL DATA:  TIA symptoms, hypertension EXAM: BILATERAL CAROTID DUPLEX ULTRASOUND TECHNIQUE: Wallace Cullens scale imaging, color Doppler and duplex ultrasound were performed of bilateral carotid and vertebral arteries in the neck. COMPARISON:  None. FINDINGS: Criteria: Quantification of carotid stenosis is based on velocity parameters that correlate the residual internal carotid diameter with NASCET-based stenosis levels, using the diameter of the distal internal carotid lumen as the denominator for stenosis measurement. The following velocity measurements were obtained: RIGHT ICA: 83/20 cm/sec CCA: 62/11 cm/sec SYSTOLIC ICA/CCA RATIO:  1.3 ECA: 106 cm/sec LEFT ICA: 95/19 cm/sec CCA: 65/15 cm/sec SYSTOLIC ICA/CCA RATIO:  1.5 ECA: 100 cm/sec RIGHT CAROTID ARTERY: Minor intimal thickening and atherosclerotic plaque formation. No hemodynamically significant right ICA stenosis, velocity elevation, or turbulent flow. Degree of narrowing less than 50%. RIGHT VERTEBRAL ARTERY:  Antegrade LEFT CAROTID ARTERY:  Similar scattered minor intimal thickening and atherosclerotic plaque formation. No hemodynamically significant left ICA stenosis, velocity elevation, or turbulent flow. LEFT VERTEBRAL ARTERY:  Antegrade IMPRESSION: Minor carotid atherosclerosis. No hemodynamically significant ICA stenosis. Degree of narrowing less than 50% bilaterally by ultrasound criteria. Patent antegrade vertebral flow bilaterally Electronically Signed   By: Judie PetitM.  Shick M.D.   On: 04/18/2019 10:52   [place] Venous Img Lower Unilateral Left  Result Date: 04/17/2019 CLINICAL DATA:  Left lower extremity pain and edema. Evaluate for DVT. EXAM: LEFT LOWER EXTREMITY VENOUS DOPPLER ULTRASOUND TECHNIQUE: Gray-scale sonography with graded compression, as well as color Doppler and duplex ultrasound  were performed to evaluate the lower extremity deep venous systems from the level of the common femoral vein and including the common femoral, femoral, profunda femoral, popliteal and calf veins including the posterior tibial, peroneal and gastrocnemius veins when visible. The superficial great saphenous vein was also interrogated. Spectral Doppler was utilized to evaluate flow at rest and with distal augmentation maneuvers in the common femoral, femoral and popliteal veins. COMPARISON:  Left tibia and fibula radiographs-earlier same day FINDINGS: Examination is degraded secondary to patient's lower extremity contractures and inability to tolerate the examination. Contralateral Common Femoral Vein: Respiratory phasicity is normal and symmetric with the symptomatic side. No evidence of thrombus. Normal compressibility. Common Femoral Vein: No evidence of thrombus. Normal compressibility, respiratory phasicity and response to augmentation. Saphenofemoral Junction: No evidence of thrombus. Normal compressibility and flow on color Doppler imaging. Profunda Femoral Vein: No evidence of thrombus. Normal compressibility and flow on color Doppler imaging. Femoral Vein: Proximal mid aspects of the left femoral vein appear patent. The distal aspect the left femoral vein was not imaged. Popliteal Vein: Unable to be imaged. Calf Veins: Unable to be imaged. Superficial Great Saphenous Vein: No evidence of thrombus. Normal compressibility. Other Findings:  None. IMPRESSION: No evidence of DVT to the mid aspect of the left femoral vein. Patient could not tolerate sonographic evaluation of the distal aspect of the left femoral vein, the popliteal vein or the tibial veins secondary to a combination lower extremity contractures and discomfort. Electronically Signed   By: Simonne ComeJohn  Watts M.D.   On: 04/17/2019 16:01   [place] Venous Img Upper Uni Left  Result Date: 04/17/2019 CLINICAL DATA:  Left upper extremity pain and edema. Evaluate for  DVT. EXAM: LEFT UPPER EXTREMITY VENOUS DOPPLER ULTRASOUND TECHNIQUE: Gray-scale sonography with graded compression, as well as color Doppler and duplex ultrasound were performed to evaluate the upper extremity deep venous system from the level of the subclavian vein and including the jugular, axillary, basilic, radial, ulnar and upper cephalic vein. Spectral Doppler was utilized to evaluate flow at rest and with distal augmentation maneuvers. COMPARISON:  None. FINDINGS: Examination is minimally degraded secondary to patient's inability to tolerate exam. Contralateral Subclavian Vein: Respiratory phasicity is normal and symmetric with the symptomatic side. No evidence of thrombus. Normal compressibility. Internal Jugular Vein: No evidence of thrombus. Normal compressibility, respiratory phasicity and response to augmentation. Subclavian Vein: No evidence of thrombus. Normal compressibility, respiratory phasicity and response to augmentation. Axillary Vein: No evidence of thrombus. Normal compressibility, respiratory phasicity and response to augmentation. Cephalic Vein: No evidence of thrombus. Normal compressibility, respiratory phasicity and response to augmentation. Basilic Vein: No evidence of thrombus. Normal compressibility, respiratory phasicity and response to augmentation. Brachial Veins: No evidence of thrombus. Normal compressibility, respiratory phasicity and response to augmentation. Radial Veins: No evidence of thrombus. Normal compressibility, respiratory phasicity and response to augmentation. Ulnar Veins: No  evidence of thrombus. Normal compressibility, respiratory phasicity and response to augmentation. Venous Reflux:  None visualized. Other Findings:  None visualized. IMPRESSION: No evidence of DVT within the left upper extremity. Electronically Signed   By: Simonne Come M.D.   On: 04/17/2019 16:02   Dg Chest Port 1 View  Result Date: 04/17/2019 CLINICAL DATA:  Stroke EXAM: PORTABLE CHEST 1  VIEW COMPARISON:  08/11/2013 FINDINGS: The heart size and mediastinal contours are within normal limits. Both lungs are clear. The visualized skeletal structures are unremarkable. Unchanged calcified granuloma the right lung. Or IMPRESSION: No active disease. Electronically Signed   By: Deatra Robinson M.D.   On: 04/17/2019 21:14   Dg Foot Complete Left  Result Date: 04/17/2019 CLINICAL DATA:  Left lower leg pain and swelling.  No known injury. EXAM: LEFT FOOT - COMPLETE 3+ VIEW COMPARISON:  None. FINDINGS: The bones are demineralized. No evidence of acute fracture or dislocation. There are mild degenerative changes in the midfoot and at the 1st metatarsophalangeal joint. The tibial sesamoid of the 1st metatarsal is bipartite. Dorsal forefoot soft tissue swelling is present without evidence of foreign body. There are scattered vascular calcifications. IMPRESSION: No evidence of acute fracture or dislocation. Dorsal forefoot soft tissue swelling. Electronically Signed   By: Carey Bullocks M.D.   On: 04/17/2019 15:03     Assessment/Plan: 85 y.o. male male with a history of hypertension, dementia, CAD, thrombocytopenia presents to the emergency room sent in from his assisted living facility due to left upper and lower extremity pain, weakness/contractions and cellulitis. Neuro exam showing an alert, awake patient in no distress with left hemiparesis/spasticity. Patient had a right pontine stroke in 2019 with left sided weakness. Thought  patient left hemiparesis/spasticity is most likely and seems to be sequela of his rather large stroke in right pons in 2019 (see MRI in 2019), but given the lack of documentation of his neuro exam on the left side, we ll investigate for other causes: obtain MRI , EMG, infectious/metabolic and so forth althought so far  his labs are wnl. Will f/up on MRI. Thank you for the consult      04/18/2019, 11:46 AM

## 2019-04-18 NOTE — Evaluation (Signed)
Physical Therapy Evaluation Patient Details Name: Ronald Matthews MRN: [id number] DOB: [birth year] Today's Date: 04/18/2019   History of Present Illness  85 y.o. male with a known history of hypertension, dementia, CAD, thrombocytopenia presents to the emergency room sent in from his assisted living facility due to left upper and lower extremity pain, weakness and contractions.  Pt with CVA last year with L sided involvement, appears worse than recent baseline on L.  Clinical Impression  Pt showed good effort with PT session.  He was able to don socks b/l with only minimal assist but had surprising amount of tone in L LE and even with lightly increased repeated PROM extension knee remained ~80 deg flexed.  He also had flexion tone in L UE.  Both U&LE had reported tone prior to this incident/fall, but per pt much different than how both are presenting at this time. Pt reports that he does not walk, but that he relatively easily was able to transfer to/from w/c and commodes and got around relatively well in w/c.  Pt showed good effort with transfer but ultimately needed min assist with squat-pivot to get to recliner.  Pt not at his baseline and will require further PT intervention, possibly with return to [facility] or at SNF per available level of assist.    Follow Up Recommendations Supervision for mobility/OOB;SNF(per ability to ALF to increase assist level)    Equipment Recommendations  None recommended by PT    Recommendations for Other Services       Precautions / Restrictions Precautions Precautions: Fall Restrictions Weight Bearing Restrictions: No      Mobility  Bed Mobility Overal bed mobility: Modified Independent             General bed mobility comments: Pt was able to get himself up to sitting EOB, showed good effort and apart from L knee flexion issues "getting in the way" he managed relatively well  Transfers Overall transfer level: Needs assistance   Transfers:  Squat Pivot Transfers     Squat pivot transfers: Min assist     General transfer comment: Pt showed good effort in trying to get to recliner (hand rail down) but was unable to elevate hips enough to truly scoot.  Able to assist with PT assisting but L knee contraction appeared significantly limiting during transition.  Ambulation/Gait             General Gait Details: Pt does not walk at baseline, inappropriate to attempt  Stairs            Wheelchair Mobility    Modified Rankin (Stroke Patients Only)       Balance Overall balance assessment: (good balance in sitting, unable to attain standing)                                           Pertinent Vitals/Pain Pain Assessment: 0-10 Pain Score: 4  Pain Location: L elbow/forearm and L lateral leg    Home Living Family/patient expects to be discharged to:: Skilled nursing facility Living Arrangements: (at [facility])                    Prior Function Level of Independence: Needs assistance   Gait / Transfers Assistance Needed: Pt reports that he can transfer to/from w/c and commode, gets assist with showers, reports he can self-propel w/c  Hand Dominance   Dominant Hand: Right    Extremity/Trunk Assessment   Upper Extremity Assessment Upper Extremity Assessment: Generalized weakness(L shoulder A/PROM to 80/110, lacks full elbow ext (tone))    Lower Extremity Assessment Lower Extremity Assessment: Generalized weakness(severe L knee flexion contracture, <100 at rest, ~80 PROM)       Communication   Communication: No difficulties  Cognition Arousal/Alertness: Awake/alert Behavior During Therapy: Anxious Overall Cognitive Status: History of cognitive impairments - at baseline                                        General Comments      Exercises     Assessment/Plan    PT Assessment Patient needs continued PT services  PT Problem List  Decreased strength;Decreased range of motion;Decreased activity tolerance;Decreased balance;Decreased mobility;Decreased coordination;Decreased knowledge of use of DME;Decreased safety awareness;Pain       PT Treatment Interventions DME instruction;Gait training;Stair training;Functional mobility training;Therapeutic activities;Therapeutic exercise;Balance training;Neuromuscular re-education;Patient/family education    PT Goals (Current goals can be found in the Care Plan section)  Acute Rehab PT Goals Patient Stated Goal: get L U&LE moving better PT Goal Formulation: With patient Time For Goal Achievement: 05/02/19 Potential to Achieve Goals: Fair    Frequency Min 2X/week(per MRI results)   Barriers to discharge        Co-evaluation               AM-PAC PT "6 Clicks" Mobility  Outcome Measure Help needed turning from your back to your side while in a flat bed without using bedrails?: A Little Help needed moving from lying on your back to sitting on the side of a flat bed without using bedrails?: A Little Help needed moving to and from a bed to a chair (including a wheelchair)?: A Little Help needed standing up from a chair using your arms (e.g., wheelchair or bedside chair)?: Total Help needed to walk in hospital room?: Total Help needed climbing 3-5 steps with a railing? : Total 6 Click Score: 12    End of Session Equipment Utilized During Treatment: Gait belt Activity Tolerance: Patient tolerated treatment well Patient left: with chair alarm set;with call bell/phone within reach;with family/visitor present Nurse Communication: Mobility status PT Visit Diagnosis: Muscle weakness (generalized) (M62.81);Unsteadiness on feet (R26.81);Hemiplegia and hemiparesis Hemiplegia - Right/Left: Left Hemiplegia - dominant/non-dominant: Non-dominant Hemiplegia - caused by: Cerebral infarction    Time: [id number] PT Time Calculation (min) (ACUTE ONLY): 35 min   Charges:   PT  Evaluation $PT Eval Low Complexity: 1 Low PT Treatments $Therapeutic Activity: 8-22 mins        Malachi ProGalen R Mohd Clemons, DPT 04/18/2019, 1:51 PM

## 2019-04-18 NOTE — NC FL2 (Signed)
[place] LEVEL OF CARE SCREENING TOOL     IDENTIFICATION  Patient Name: Ronald Matthews Birthdate: [birth year] Sex: male Admission Date (Current Location): 04/17/2019  [place] and [place] Number:  Engineering geologist and Address:  [facility], [address] [place], [place] [address]      Provider Number: [id number]  Attending Physician Name and Address:  Nicholes Mango, MD  Relative Name and Phone Number:  Hetty Ely [phone]    Current Level of Care: Hospital Recommended Level of Care: [place] Prior Approval Number:    Date Approved/Denied:   PASRR Number: [id number]  Discharge Plan: SNF    Current Diagnoses: Patient Active Problem List   Diagnosis Date Noted  . CVA (cerebral vascular accident) ([place]) 04/17/2019  . Ischemic stroke ([place]) 07/15/2018  . Hypertension 07/15/2018  . Dementia ([place]) 07/15/2018  . Coronary artery disease 07/15/2018    Orientation RESPIRATION BLADDER Height & Weight     Self, Situation, Time, Place  Normal Incontinent Weight: 81.6 kg Height:  6\' 4"  (193 cm)  BEHAVIORAL SYMPTOMS/MOOD NEUROLOGICAL BOWEL NUTRITION STATUS      Incontinent Diet  AMBULATORY STATUS COMMUNICATION OF NEEDS Skin   Extensive Assist Verbally Normal                       Personal Care Assistance Level of Assistance  Bathing, Feeding, Dressing Bathing Assistance: Maximum assistance Feeding assistance: Limited assistance Dressing Assistance: Maximum assistance     Functional Limitations Info             SPECIAL CARE FACTORS FREQUENCY  PT (By licensed PT), OT (By licensed OT)     PT Frequency: 5 times per week OT Frequency: 5 times per week            Contractures Contractures Info: Present(Left arm and leg from previous stroke)    Additional Factors Info  Code Status, Allergies Code Status Info: Full Allergies Info: PPD test           Current Medications (04/18/2019):  This  is the current hospital active medication list Current Facility-Administered Medications  Medication Dose Route Frequency Provider Last Rate Last Dose  . acetaminophen (TYLENOL) tablet 650 mg  650 mg Oral Q4H PRN Hillary Bow, MD       Or  . acetaminophen (TYLENOL) solution 650 mg  650 mg Per Tube Q4H PRN Sudini, Alveta Heimlich, MD       Or  . acetaminophen (TYLENOL) suppository 650 mg  650 mg Rectal Q4H PRN Sudini, Srikar, MD      . aspirin suppository 300 mg  300 mg Rectal Daily Sudini, Srikar, MD       Or  . aspirin EC tablet 325 mg  325 mg Oral Daily Hillary Bow, MD   325 mg at 04/18/19 1121  . ceFAZolin (ANCEF) IVPB 2g/100 mL premix  2 g Intravenous Q8H Lu Duffel, RPH 200 mL/hr at 04/18/19 1618 2 g at 04/18/19 1618  . enoxaparin (LOVENOX) injection 40 mg  40 mg Subcutaneous Q24H Hillary Bow, MD   40 mg at 04/17/19 2342  . morphine 2 MG/ML injection 2 mg  2 mg Intravenous Q4H PRN Lance Coon, MD   2 mg at 04/18/19 0827  . senna-docusate (Senokot-S) tablet 1 tablet  1 tablet Oral QHS PRN Hillary Bow, MD         Discharge Medications: Please see discharge summary for a list of discharge medications.  Relevant Imaging  Results:  Relevant Lab Results:   Additional Information SS# [id number]  Allayne ButcherJeanna M Laksh Hinners, RN

## 2019-04-18 NOTE — Progress Notes (Signed)
*  PRELIMINARY RESULTS* Echocardiogram 2D Echocardiogram has been performed.  Ronald Matthews 04/18/2019, 10:58 AM
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
## 2019-04-18 NOTE — Progress Notes (Signed)
[facility] [place] NAME: Ronald Matthews    MR#:  [id number]  DATE OF BIRTH:  09/23/1933  SUBJECTIVE:  CHIEF COMPLAINT: Patient's leg pain is improving.  No other complaints Patient has baseline dementia REVIEW OF SYSTEMS:  Not a good historian  DRUG ALLERGIES:   Allergies  Allergen Reactions  . Ppd [Tuberculin Purified Protein Derivative] Other (See Comments)    "Allergic," per MAR  . Tuberculin Tests Other (See Comments)    "Allergic," per MAR    VITALS:  Blood pressure 132/60, pulse 75, temperature 98.1 F (36.7 C), temperature source Oral, resp. rate 18, height 6\' 4"  (1.93 m), weight 81.6 kg, SpO2 100 %.  PHYSICAL EXAMINATION:  GENERAL:  85 y.o.-year-old patient lying in the bed with no acute distress.  EYES: Pupils equal, round, reactive to light and accommodation. No scleral icterus. Extraocular muscles intact.  HEENT: Head atraumatic, normocephalic. Oropharynx and nasopharynx clear.  NECK:  Supple, no jugular venous distention. No thyroid enlargement, no tenderness.  LUNGS: Normal breath sounds bilaterally, no wheezing, rales,rhonchi or crepitation. No use of accessory muscles of respiration.  CARDIOVASCULAR: S1, S2 normal. No murmurs, rubs, or gallops.  ABDOMEN: Soft, nontender, nondistended. Bowel sounds present.  EXTREMITIES: No pedal edema, cyanosis, or clubbing.  NEUROLOGIC: Awake and alert but disoriented muscle strength 5/5 in all right-sided extremities.  Left upper and lower extremity 4 out of 5 sensation intact. Gait not checked for safety reasons PSYCHIATRIC: The patient is alert and oriented x 3.  SKIN: No obvious rash, lesion, or ulcer.    LABORATORY PANEL:   CBC Recent Labs  Lab 04/17/19 1610  WBC 7.5  HGB 10.4*  HCT 32.3*  PLT 300   ------------------------------------------------------------------------------------------------------------------  Chemistries  Recent Labs  Lab 04/17/19 1610   NA 141  K 4.1  CL 109  CO2 23  GLUCOSE 84  BUN 15  CREATININE 0.71  CALCIUM 8.6*  AST 35  ALT 32  ALKPHOS 68  BILITOT 0.5   ------------------------------------------------------------------------------------------------------------------  Cardiac Enzymes Recent Labs  Lab 04/17/19 1610  TROPONINI <0.03   ------------------------------------------------------------------------------------------------------------------  RADIOLOGY:  Dg Elbow Complete Left  Result Date: 04/17/2019 CLINICAL DATA:  Left elbow swelling.  No known injury. EXAM: LEFT ELBOW - COMPLETE 3+ VIEW COMPARISON:  None. FINDINGS: The mineralization and alignment are normal. There is no evidence of acute fracture or dislocation. The joint spaces are preserved. There is mild spurring of the coronoid process. No significant elbow joint effusion. There are several calcifications within the soft tissues anteromedial to the distal humerus. These are not typical for sequela of prior avulsion injuries or hydroxyapatite deposition. There is edema throughout the subcutaneous fat. IMPRESSION: No acute osseous findings, significant arthropathic changes or joint effusion. Indeterminate calcifications in the soft tissues of the distal forearm. Electronically Signed   By: Richardean Sale M.D.   On: 04/17/2019 15:06   Dg Tibia/fibula Left  Result Date: 04/17/2019 CLINICAL DATA:  Left leg pain. EXAM: LEFT TIBIA AND FIBULA - 2 VIEW COMPARISON:  None. FINDINGS: There is no evidence of acute fracture or dislocation. Mild soft tissue swelling about the ankle. Vascular calcifications are noted. IMPRESSION: No acute fracture or dislocation identified about the left tibia and fibula. Electronically Signed   By: Fidela Salisbury M.D.   On: 04/17/2019 15:02   Dg Abd 1 View  Result Date: 04/17/2019 CLINICAL DATA:  Stroke EXAM: ABDOMEN - 1 VIEW COMPARISON:  None. FINDINGS: Moderate stool volume. Calcifications in the  right upper quadrant  are likely gallstones. No dilated small bowel. IMPRESSION: Negative. Electronically Signed   By: Deatra RobinsonKevin  Trevontae M.D.   On: 04/17/2019 21:17   Ct Head Wo Contrast  Result Date: 04/17/2019 CLINICAL DATA:  New left arm and leg contractures. EXAM: CT HEAD WITHOUT CONTRAST TECHNIQUE: Contiguous axial images were obtained from the base of the skull through the vertex without intravenous contrast. COMPARISON:  Head CT 03/27/2019 FINDINGS: Brain: No evidence of acute infarction, hemorrhage, hydrocephalus, extra-axial collection or mass lesion/mass effect. Advanced deep white matter microangiopathy and moderate brain parenchymal volume loss. Stable encephalomalacia of the right aspect of the pons. Vascular: Calcific atherosclerotic disease at the skull base. Skull: Normal. Negative for fracture or focal lesion. Sinuses/Orbits: No acute finding. Other: None. IMPRESSION: 1. No acute intracranial abnormality. 2. Advanced chronic microvascular disease and moderate brain parenchymal volume loss. 3. 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 Electronically Signed   By: Ted Mcalpineobrinka  Dimitrova M.D.   On: 04/17/2019 18:08   Mr Brain Wo Contrast  Result Date: 04/18/2019 CLINICAL DATA:  Ataxia.  Rule out stroke EXAM: MRI HEAD WITHOUT CONTRAST MRA HEAD WITHOUT CONTRAST TECHNIQUE: Multiplanar, multiecho pulse sequences of the brain and surrounding structures were obtained without intravenous contrast. Angiographic images of the head were obtained using MRA technique without contrast. COMPARISON:  CT head 04/17/2019, MRI 07/15/2018 FINDINGS: MRI HEAD FINDINGS Brain: Negative for acute infarct. Moderate atrophy. Ventricular enlargement is unchanged from prior studies. CSF flow related signal in the aqueduct and fourth ventricle. Advanced chronic microvascular ischemic changes throughout the cerebral white matter. Chronic infarct right pons. Negative for hemorrhage or mass. Vascular: Normal  arterial flow voids. Skull and upper cervical spine: Negative Sinuses/Orbits: Negative Other: None MRA HEAD FINDINGS Right vertebral artery dominant. Diffusely disease distal left vertebral artery with moderate stenosis which has progressed since 07/15/2018. Basilar widely patent with proximal fenestration unchanged. PICA not visualized. Prominent AICA patent bilaterally. Severe stenosis distal basilar and proximal P1 segments. Distal basilar was widely patent previously on MRA 07/15/2018. This could be due to thrombus. Internal carotid artery widely patent. Atherosclerotic irregularity and mild stenosis cavernous carotid bilaterally. Anterior and middle cerebral arteries patent. Irregularity is felt to be largely due to artifact from motion. Image quality degraded by motion. IMPRESSION: 1. Negative for acute infarct. 2. Moderate atrophy with extensive chronic ischemic change in the white matter. Chronic infarct right pons 3. MRA image quality degraded by motion. There is severe stenosis distal basilar which was not present on the prior study and could be due to thrombus, or possibly artifact. Recommend confirmation with CTA head. There is significant stenosis of the proximal P1 segment bilaterally. 4. These results were called by telephone at the time of interpretation on 04/18/2019 at 2:16 pm to Dr. Almon RegisterGuoru , who verbally acknowledged these results. Electronically Signed   By: Marlan Palauharles  Clark M.D.   On: 04/18/2019 14:16   [place] Carotid Bilateral (at [facility] And [facility] Only)  Result Date: 04/18/2019 CLINICAL DATA:  TIA symptoms, hypertension EXAM: BILATERAL CAROTID DUPLEX ULTRASOUND TECHNIQUE: Wallace CullensGray scale imaging, color Doppler and duplex ultrasound were performed of bilateral carotid and vertebral arteries in the neck. COMPARISON:  None. FINDINGS: Criteria: Quantification of carotid stenosis is based on velocity parameters that correlate the residual internal carotid diameter with NASCET-based stenosis levels, using the  diameter of the distal internal carotid lumen as the denominator for stenosis measurement. The following velocity measurements were obtained: RIGHT ICA: 83/20 cm/sec CCA: 62/11 cm/sec SYSTOLIC  ICA/CCA RATIO:  1.3 ECA: 106 cm/sec LEFT ICA: 95/19 cm/sec CCA: 65/15 cm/sec SYSTOLIC ICA/CCA RATIO:  1.5 ECA: 100 cm/sec RIGHT CAROTID ARTERY: Minor intimal thickening and atherosclerotic plaque formation. No hemodynamically significant right ICA stenosis, velocity elevation, or turbulent flow. Degree of narrowing less than 50%. RIGHT VERTEBRAL ARTERY:  Antegrade LEFT CAROTID ARTERY: Similar scattered minor intimal thickening and atherosclerotic plaque formation. No hemodynamically significant left ICA stenosis, velocity elevation, or turbulent flow. LEFT VERTEBRAL ARTERY:  Antegrade IMPRESSION: Minor carotid atherosclerosis. No hemodynamically significant ICA stenosis. Degree of narrowing less than 50% bilaterally by ultrasound criteria. Patent antegrade vertebral flow bilaterally Electronically Signed   By: Judie PetitM.  Shick M.D.   On: 04/18/2019 10:52   [place] Venous Img Lower Unilateral Left  Result Date: 04/17/2019 CLINICAL DATA:  Left lower extremity pain and edema. Evaluate for DVT. EXAM: LEFT LOWER EXTREMITY VENOUS DOPPLER ULTRASOUND TECHNIQUE: Gray-scale sonography with graded compression, as well as color Doppler and duplex ultrasound were performed to evaluate the lower extremity deep venous systems from the level of the common femoral vein and including the common femoral, femoral, profunda femoral, popliteal and calf veins including the posterior tibial, peroneal and gastrocnemius veins when visible. The superficial great saphenous vein was also interrogated. Spectral Doppler was utilized to evaluate flow at rest and with distal augmentation maneuvers in the common femoral, femoral and popliteal veins. COMPARISON:  Left tibia and fibula radiographs-earlier same day FINDINGS: Examination is degraded secondary to patient's  lower extremity contractures and inability to tolerate the examination. Contralateral Common Femoral Vein: Respiratory phasicity is normal and symmetric with the symptomatic side. No evidence of thrombus. Normal compressibility. Common Femoral Vein: No evidence of thrombus. Normal compressibility, respiratory phasicity and response to augmentation. Saphenofemoral Junction: No evidence of thrombus. Normal compressibility and flow on color Doppler imaging. Profunda Femoral Vein: No evidence of thrombus. Normal compressibility and flow on color Doppler imaging. Femoral Vein: Proximal mid aspects of the left femoral vein appear patent. The distal aspect the left femoral vein was not imaged. Popliteal Vein: Unable to be imaged. Calf Veins: Unable to be imaged. Superficial Great Saphenous Vein: No evidence of thrombus. Normal compressibility. Other Findings:  None. IMPRESSION: No evidence of DVT to the mid aspect of the left femoral vein. Patient could not tolerate sonographic evaluation of the distal aspect of the left femoral vein, the popliteal vein or the tibial veins secondary to a combination lower extremity contractures and discomfort. Electronically Signed   By: Simonne ComeJohn  Watts M.D.   On: 04/17/2019 16:01   [place] Venous Img Upper Uni Left  Result Date: 04/17/2019 CLINICAL DATA:  Left upper extremity pain and edema. Evaluate for DVT. EXAM: LEFT UPPER EXTREMITY VENOUS DOPPLER ULTRASOUND TECHNIQUE: Gray-scale sonography with graded compression, as well as color Doppler and duplex ultrasound were performed to evaluate the upper extremity deep venous system from the level of the subclavian vein and including the jugular, axillary, basilic, radial, ulnar and upper cephalic vein. Spectral Doppler was utilized to evaluate flow at rest and with distal augmentation maneuvers. COMPARISON:  None. FINDINGS: Examination is minimally degraded secondary to patient's inability to tolerate exam. Contralateral Subclavian Vein:  Respiratory phasicity is normal and symmetric with the symptomatic side. No evidence of thrombus. Normal compressibility. Internal Jugular Vein: No evidence of thrombus. Normal compressibility, respiratory phasicity and response to augmentation. Subclavian Vein: No evidence of thrombus. Normal compressibility, respiratory phasicity and response to augmentation. Axillary Vein: No evidence of thrombus. Normal compressibility, respiratory phasicity and response to augmentation. Cephalic Vein:  No evidence of thrombus. Normal compressibility, respiratory phasicity and response to augmentation. Basilic Vein: No evidence of thrombus. Normal compressibility, respiratory phasicity and response to augmentation. Brachial Veins: No evidence of thrombus. Normal compressibility, respiratory phasicity and response to augmentation. Radial Veins: No evidence of thrombus. Normal compressibility, respiratory phasicity and response to augmentation. Ulnar Veins: No evidence of thrombus. Normal compressibility, respiratory phasicity and response to augmentation. Venous Reflux:  None visualized. Other Findings:  None visualized. IMPRESSION: No evidence of DVT within the left upper extremity. Electronically Signed   By: Simonne ComeJohn  Watts M.D.   On: 04/17/2019 16:02   Dg Chest Port 1 View  Result Date: 04/17/2019 CLINICAL DATA:  Stroke EXAM: PORTABLE CHEST 1 VIEW COMPARISON:  08/11/2013 FINDINGS: The heart size and mediastinal contours are within normal limits. Both lungs are clear. The visualized skeletal structures are unremarkable. Unchanged calcified granuloma the right lung. Or IMPRESSION: No active disease. Electronically Signed   By: Deatra RobinsonKevin  Shyheim M.D.   On: 04/17/2019 21:14   Dg Foot Complete Left  Result Date: 04/17/2019 CLINICAL DATA:  Left lower leg pain and swelling.  No known injury. EXAM: LEFT FOOT - COMPLETE 3+ VIEW COMPARISON:  None. FINDINGS: The bones are demineralized. No evidence of acute fracture or dislocation. There  are mild degenerative changes in the midfoot and at the 1st metatarsophalangeal joint. The tibial sesamoid of the 1st metatarsal is bipartite. Dorsal forefoot soft tissue swelling is present without evidence of foreign body. There are scattered vascular calcifications. IMPRESSION: No evidence of acute fracture or dislocation. Dorsal forefoot soft tissue swelling. Electronically Signed   By: Carey BullocksWilliam  Veazey M.D.   On: 04/17/2019 15:03   Mr Maxine GlennMra Head/brain YNWo Cm  Result Date: 04/18/2019 CLINICAL DATA:  Ataxia.  Rule out stroke EXAM: MRI HEAD WITHOUT CONTRAST MRA HEAD WITHOUT CONTRAST TECHNIQUE: Multiplanar, multiecho pulse sequences of the brain and surrounding structures were obtained without intravenous contrast. Angiographic images of the head were obtained using MRA technique without contrast. COMPARISON:  CT head 04/17/2019, MRI 07/15/2018 FINDINGS: MRI HEAD FINDINGS Brain: Negative for acute infarct. Moderate atrophy. Ventricular enlargement is unchanged from prior studies. CSF flow related signal in the aqueduct and fourth ventricle. Advanced chronic microvascular ischemic changes throughout the cerebral white matter. Chronic infarct right pons. Negative for hemorrhage or mass. Vascular: Normal arterial flow voids. Skull and upper cervical spine: Negative Sinuses/Orbits: Negative Other: None MRA HEAD FINDINGS Right vertebral artery dominant. Diffusely disease distal left vertebral artery with moderate stenosis which has progressed since 07/15/2018. Basilar widely patent with proximal fenestration unchanged. PICA not visualized. Prominent AICA patent bilaterally. Severe stenosis distal basilar and proximal P1 segments. Distal basilar was widely patent previously on MRA 07/15/2018. This could be due to thrombus. Internal carotid artery widely patent. Atherosclerotic irregularity and mild stenosis cavernous carotid bilaterally. Anterior and middle cerebral arteries patent. Irregularity is felt to be largely due  to artifact from motion. Image quality degraded by motion. IMPRESSION: 1. Negative for acute infarct. 2. Moderate atrophy with extensive chronic ischemic change in the white matter. Chronic infarct right pons 3. MRA image quality degraded by motion. There is severe stenosis distal basilar which was not present on the prior study and could be due to thrombus, or possibly artifact. Recommend confirmation with CTA head. There is significant stenosis of the proximal P1 segment bilaterally. 4. These results were called by telephone at the time of interpretation on 04/18/2019 at 2:16 pm to Dr. Almon RegisterGuoru , who verbally acknowledged these results. Electronically Signed  By: Marlan Palau M.D.   On: 04/18/2019 14:16    EKG:   Orders placed or performed during the hospital encounter of 03/27/19  . ED EKG  . ED EKG    ASSESSMENT AND PLAN:    *Left-sided weakness.  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  MRI of the brain is negative  Carotid Dopplers Radiologist has recommended CT angiogram of the head to rule out severe stenosis of distal basilar artery-ordered  Neurology consulted and Dr. 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   IV Ancef.  It is unclear if this is cellulitis or myopathy.  EMG ordered  CK 153, sed rate is 9   *Hypertension.  Continue home medications  *Dementia.  Monitor for inpatient delirium  DVT prophylaxis with Lovenox   Disposition [facility]/SNF with supervision All the records are reviewed and case discussed with Care Management/Social Workerr. Management plans discussed with the patient, family and they are in agreement.  CODE STATUS:   TOTAL TIME TAKING CARE OF THIS PATIENT: 36  minutes.   POSSIBLE D/C IN 1-2  DAYS, DEPENDING ON  CLINICAL CONDITION.  Note: This dictation was prepared with Dragon dictation along with smaller phrase technology. Any transcriptional errors that result from this process are unintentional.   Ramonita Lab M.D on 04/18/2019 at 3:48 PM  Between 7am to 6pm - Pager - [phone] After 6pm go to [email or web address] - password [email or web address] [facility]  [place] [facility]  Office  [phone]  CC: Primary care physician; Shelbie Ammons, MD

## 2019-04-19 MED ORDER — CEPHALEXIN 500 MG PO CAPS: 500.0000 mg | ORAL_CAPSULE | Freq: Two times a day (BID) | ORAL | 0 refills | Status: DC

## 2019-04-19 MED ORDER — SENNOSIDES-DOCUSATE SODIUM 8.6-50 MG PO TABS: 1.0000 | ORAL_TABLET | Freq: Every evening | ORAL | Status: DC | PRN

## 2019-04-19 MED ORDER — CEPHALEXIN 500 MG PO CAPS
500.0000 mg | ORAL_CAPSULE | Freq: Two times a day (BID) | ORAL | Status: DC
  Administered 2019-04-19: 500 mg via ORAL
  Filled 2019-04-19: qty 1

## 2019-04-19 NOTE — TOC Transition Note (Signed)
Transition of Care [facility]) - CM/SW Discharge Note   Patient Details  Name: Ronald Matthews MRN: [id number] Date of Birth: April 07, 1933  [facility] CM/SW Contact:  Ronald Hutching, RN Phone Number: 04/19/2019, 12:21 PM   Clinical Narrative:    Discharging to [facility].  Bedside RN to call report.    Final next level of care: Skilled Nursing Facility Barriers to Discharge: Barriers Resolved   Patient Goals and CMS Choice Patient states their goals for this hospitalization and ongoing recovery are:: patient is unable to voice goals CMS Medicare.gov Compare Post Acute Care list provided to:: Patient Represenative (must comment)(Sister) Choice offered to / list presented to : Sibling  Discharge Placement              Patient chooses bed at: [facility] [place] [facility] Patient to be transferred to facility by: [facility] Name of family member notified: Ronald Matthews- Sister and Thayer Headings at [place] Patient and family notified of of transfer: 04/19/19  Discharge Plan and Services   Discharge Planning Services: CM Consult                                 Social Determinants of Health ([place]) Interventions     Readmission Risk Interventions No flowsheet data found.

## 2019-04-19 NOTE — TOC Progression Note (Signed)
Transition of Care [facility]) - Progression Note    Patient Details  Name: Ronald Matthews MRN: [id number] Date of Birth: 1933/08/20  [facility]) CM/SW Contact  Shelbie Hutching, RN Phone Number: 04/19/2019, 10:51 AM  Clinical Narrative:    [facility] has offered patient a bed, he can go today to room 808.  Bedside RN to call report and patient will transport by [facility].     Expected Discharge Plan: [place] Barriers to Discharge: Continued Medical Work up  Expected Discharge Plan and Services Expected Discharge Plan: [place]   Discharge Planning Services: CM Consult   Living arrangements for the past 2 months: Assisted Living Facility                                       Social Determinants of Health (SDOH) Interventions    Readmission Risk Interventions No flowsheet data found.

## 2019-04-19 NOTE — Evaluation (Signed)
Occupational Therapy Evaluation Patient Details Name: Ronald Matthews MRN: [id number] DOB: 1933/06/14 Today's Date: 04/19/2019    History of Present Illness Pt. is a 85 y.o. male with a known history of hypertension, dementia, CAD, thrombocytopenia presents to the emergency room sent in from his assisted living facility due to left upper and lower extremity pain, weakness and contractions.  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   Clinical Impression   Pt. presents with left elbow, and knee flexion contracture, weakness, limited activity tolerance, and limited functional mobility which hinders his ability to complete basic ADL and IADL functioning. Pt. Resides at [facility]. Pt. required assist with ADL, IADLS, and medication management from ALF staff.  Pt. reports eating meals in his room, and was w/c bound. Pt. Was assisted with positioning. Pt. Education was provided about ROM, and positioning. Pt. Tolerated ROM, and to the LUE, and slow gentle stretching to the left elbow. Pt. Was assisted in changing linens as they were soiled. Pt. Could benefit from OT services for ADL training, there. Ex. Neuromuscular re-education, and pt. education about positioning, and ROM.    Follow Up Recommendations  SNF    Equipment Recommendations       Recommendations for Other Services       Precautions / Restrictions        Mobility Bed Mobility Overal bed mobility: Modified Independent                Transfers     Transfers: Squat Pivot Transfers     Squat pivot transfers: Min assist     General transfer comment: Mobility per PT.    Balance                                           ADL either performed or assessed with clinical judgement   ADL Overall ADL's : Needs assistance/impaired Eating/Feeding: Set up   Grooming: Minimal assistance   Upper Body Bathing: Moderate assistance   Lower Body  Bathing: Moderate assistance   Upper Body Dressing : Moderate assistance   Lower Body Dressing: Moderate assistance                       Vision Baseline Vision/History: Wears glasses Wears Glasses: Reading only       Perception     Praxis      Pertinent Vitals/Pain Pain Assessment: 0-10 Pain Score: 8  Pain Location: Left thigh Pain Descriptors / Indicators: Aching Pain Intervention(s): Limited activity within patient's tolerance;Monitored during session     Hand Dominance Right   Extremity/Trunk Assessment Upper Extremity Assessment Upper Extremity Assessment: Generalized weakness;LUE deficits/detail LUE Deficits / Details: Limited left shoulder AROM, elbow flexion contracture           Communication Communication Communication: No difficulties   Cognition Arousal/Alertness: Awake/alert Behavior During Therapy: Flat affect Overall Cognitive Status: History of cognitive impairments - at baseline                                     General Comments       Exercises     Shoulder Instructions      Home Living Family/patient expects to be discharged to:: Skilled nursing facility  Additional Comments: Pt. resides at [facility].      Prior Functioning/Environment Level of Independence: Needs assistance  Gait / Transfers Assistance Needed: Pt. used a w/c ADL's / Homemaking Assistance Needed: Pt. required assist from staff for morning ADLs.            OT Problem List: Decreased strength;Decreased range of motion;Decreased activity tolerance;Impaired UE functional use;Decreased knowledge of use of DME or AE      OT Treatment/Interventions: Self-care/ADL training;Therapeutic exercise;Patient/family education;Neuromuscular education    OT Goals(Current goals can be found in the care plan section)    OT Frequency: Min 2X/week   Barriers to D/C:            Co-evaluation               AM-PAC OT "6 Clicks" Daily Activity     Outcome Measure Help from another person eating meals?: A Little Help from another person taking care of personal grooming?: A Little Help from another person toileting, which includes using toliet, bedpan, or urinal?: A Lot Help from another person bathing (including washing, rinsing, drying)?: A Lot Help from another person to put on and taking off regular upper body clothing?: A Lot Help from another person to put on and taking off regular lower body clothing?: A Lot 6 Click Score: 14   End of Session    Activity Tolerance: Patient tolerated treatment well Patient left: in bed  OT Visit Diagnosis: Unsteadiness on feet (R26.81);Muscle weakness (generalized) (M62.81)                Time: 5284-13241113-1128 OT Time Calculation (min): 15 min Charges:  OT General Charges $OT Visit: 1 Visit OT Evaluation $OT Eval Moderate Complexity: 1 Mod  Olegario MessierElaine Pilar Westergaard, MS, OTR/L  Olegario Messierlaine Haddie Bruhl 04/19/2019, 11:55 AM

## 2019-04-19 NOTE — Discharge Summary (Signed)
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 Coronary artery disease   . Dementia (HCC)   . Hypertension   . Thrombocytopenia [facility])     HOSPITAL COURSE:  HPI  Ronald Matthews  is a 85 y.o. male with a known history of hypertension, dementia, CAD, thrombocytopenia presents to the emergency room sent in from his assisted living facility due to left upper and lower extremity pain, weakness and contractions.  Patient normally is able to ambulate with help.  But since earlier in the day patient has had pain and unable to extend his knee and elbow.  Here he has been found to have some swelling and redness around his left lower extremity.  Patient had a CT scan of the head which is negative for stroke.  He is being admitted for left-sided weakness, pain and cellulitis.  Patient is a poor historian. No fall.  *Left-sided weakness with spasticity.  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  MRI of the brain is negative  Carotid Dopplers-degree of narrowing less than 50% bilaterally Radiologist has recommended CT angiogram of the head to rule out severe stenosis of distal basilar artery-ruled out no acute  findings Neurology consulted and Dr. Tomi Bamberger  has seen the patient.  Okay to discharge patient.  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   IV Ancef given during the hospital course and will discharge with p.o. Keflex Outpatient follow-up with neurology and outpatient EMG as recommended by neurology CK 153, sed rate is 9   *Hypertension. Continue home medications  *Dementia. Monitor for inpatient delirium  DVT prophylaxis with Lovenox  [facility]  DISCHARGE CONDITIONS:   FAIR  CONSULTS OBTAINED:  Treatment Team:  Marita Snellen, MD   PROCEDURES  None   DRUG ALLERGIES:   Allergies  Allergen Reactions  . Ppd [Tuberculin Purified Protein Derivative] Other (See Comments)    "Allergic," per [facility]  . Tuberculin Tests Other (See Comments)    "Allergic," per [facility]    DISCHARGE MEDICATIONS:   Allergies as of 04/19/2019      Reactions   Ppd [tuberculin Purified Protein Derivative] Other (See Comments)   "Allergic," per [facility]   Tuberculin Tests Other (See Comments)   "Allergic," per [facility]      Medication List    TAKE these medications   acetaminophen 325 MG tablet Commonly known as: TYLENOL Take 2 tablets (650 mg total) by mouth every 4 (four) hours as needed for mild pain (or temp > 37.5 C (99.5 F)).   aspirin 81 MG EC tablet Take 1 tablet (  81 mg total) by mouth daily.   atorvastatin 40 MG tablet Commonly known as: LIPITOR Take 1 tablet (40 mg total) by mouth daily at 6 PM.   bismuth subsalicylate 262 MG/15ML suspension Commonly known as: PEPTO BISMOL Take 15 mLs by mouth every 2 (two) hours as needed for indigestion or diarrhea or loose stools (max 6 doses daily).   brimonidine 0.2 % ophthalmic solution Commonly known as: ALPHAGAN Place 2 drops into  both eyes 2 (two) times daily.   cephALEXin 500 MG capsule Commonly known as: KEFLEX Take 1 capsule (500 mg total) by mouth every 12 (twelve) hours.   loperamide 2 MG capsule Commonly known as: IMODIUM Take 2 mg by mouth as needed for diarrhea or loose stools (max 4 doses in 12 hours).   metoprolol succinate 25 MG 24 hr tablet Commonly known as: TOPROL-XL Take 25 mg by mouth daily.   Milk of Magnesia 400 MG/5ML suspension Generic drug: magnesium hydroxide Take 30 mLs by mouth daily as needed for mild constipation.   mineral oil-hydrophilic petrolatum ointment Apply topically as needed for dry skin.   Mintox 200-200-20 MG/5ML suspension Generic drug: alum & mag hydroxide-simeth Take 30 mLs by mouth daily as needed for indigestion or heartburn.   nitroGLYCERIN 0.4 MG SL tablet Commonly known as: NITROSTAT Place 0.4 mg under the tongue every 5 (five) minutes x 3 doses as needed for chest pain (AND CALL EMS IF NO RESOLUTION).   Robitussin To Go Cough/Cold CF 5-10-100 MG/5ML Liqd Generic drug: Phenylephrine-DM-GG Take 10 mLs by mouth every 6 (six) hours as needed (cough).   senna-docusate 8.6-50 MG tablet Commonly known as: Senokot-S Take 1 tablet by mouth at bedtime as needed for mild constipation.   sodium phosphate 7-19 GM/118ML Enem Place 1 enema rectally daily as needed (constipation not resolved by milk of mag and prune juice).   Therems Tabs Take 1 tablet by mouth daily.   travoprost (benzalkonium) 0.004 % ophthalmic solution Commonly known as: TRAVATAN Place 1 drop into both eyes at bedtime.   white petrolatum Gel Commonly known as: VASELINE Apply 1 application topically as needed (for dry areas on body).        DISCHARGE INSTRUCTIONS:   Follow-up with primary care physician in 2 to 3 days Follow-up with outpatient neurology in 1 to 2 weeks and get EMG  DIET:  Cardiac diet  DISCHARGE CONDITION:  Stable  ACTIVITY:  Activity as tolerated  OXYGEN:   Home Oxygen: No.   Oxygen Delivery: room air  DISCHARGE LOCATION:  nursing home   If you experience worsening of your admission symptoms, develop shortness of breath, life threatening emergency, suicidal or homicidal thoughts you must seek medical attention immediately by calling 911 or calling your MD immediately  if symptoms less severe.  You Must read complete instructions/literature along with all the possible adverse reactions/side effects for all the Medicines you take and that have been prescribed to you. Take any new Medicines after you have completely understood and accpet all the possible adverse reactions/side effects.   Please note  You were cared for by a hospitalist during your hospital stay. If you have any questions about your discharge medications or the care you received while you were in the hospital after you are discharged, you can call the unit and asked to speak with the hospitalist on call if the hospitalist that took care of you is not available. Once you are discharged, your primary care physician will handle any further medical issues.  Please note that NO REFILLS for any discharge medications will be authorized once you are discharged, as it is imperative that you return to your primary care physician (or establish a relationship with a primary care physician if you do not have one) for your aftercare needs so that they can reassess your need for medications and monitor your lab values.     Today  Chief Complaint  Patient presents with  . Leg Pain   Patient is feeling better.  Agreeable to go to rehab center.  Poor historian but answers few questions  ROS: Limited as patient is a poor historian CONSTITUTIONAL: Denies fevers, chills. Denies any fatigue, weakness.  EYES: Denies blurry vision, double vision, eye pain. RESPIRATORY: Denies cough, wheeze, shortness of breath.  CARDIOVASCULAR: Denies chest pain, palpitations, edema.  MUSCULOSKELETAL: Denies pain in  neck, back, shoulder, knees, hips or arthritic symptoms.  NEUROLOGIC: Denies paralysis, paresthesias.     VITAL SIGNS:  Blood pressure (!) 107/53, pulse 72, temperature 99.8 F (37.7 C), temperature source Oral, resp. rate 16, height 6\' 4"  (1.93 m), weight 81.6 kg, SpO2 99 %.  I/O:    Intake/Output Summary (Last 24 hours) at 04/19/2019 1212 Last data filed at 04/19/2019 0900 Gross per 24 hour  Intake 341.52 ml  Output 400 ml  Net -58.48 ml    PHYSICAL EXAMINATION:  GENERAL:  85 y.o.-year-old patient lying in the bed with no acute distress.  EYES: Pupils equal, round, reactive to light and accommodation. No scleral icterus. Extraocular muscles intact.  HEENT: Head atraumatic, normocephalic. Oropharynx and nasopharynx clear.  NECK:  Supple, no jugular venous distention. No thyroid enlargement, no tenderness.  LUNGS: Normal breath sounds bilaterally, no wheezing, rales,rhonchi or crepitation. No use of accessory muscles of respiration.  CARDIOVASCULAR: S1, S2 normal. No murmurs, rubs, or gallops.  ABDOMEN: Soft, non-tender, non-distended. Bowel sounds present. EXTREMITIES: No pedal edema, cyanosis, or clubbing.  NEUROLOGIC: Cranial nerves II through XII are intact. Muscle strength 5/5 in all extremities. Sensation intact. Gait not checked.  PSYCHIATRIC: The patient is alert and oriented x 2- 3.  SKIN: Left lower extremity-inflamed skin-clinically improving  DATA REVIEW:   CBC Recent Labs  Lab 04/17/19 1610  WBC 7.5  HGB 10.4*  HCT 32.3*  PLT 300    Chemistries  Recent Labs  Lab 04/17/19 1610  NA 141  K 4.1  CL 109  CO2 23  GLUCOSE 84  BUN 15  CREATININE 0.71  CALCIUM 8.6*  AST 35  ALT 32  ALKPHOS 68  BILITOT 0.5    Cardiac Enzymes Recent Labs  Lab 04/17/19 1610  TROPONINI <0.03    Microbiology Results  Results for orders placed or performed during the hospital encounter of 04/17/19  SARS Coronavirus 2 (CEPHEID - Performed in [facility] hospital  lab), Hosp Order     Status: None   Collection Time: 04/17/19  8:20 PM   Specimen: Nasopharyngeal Swab  Result Value Ref Range Status   SARS Coronavirus 2 NEGATIVE NEGATIVE Final    Comment: (NOTE) If result is NEGATIVE SARS-CoV-2 target nucleic acids are NOT DETECTED. The SARS-CoV-2 RNA is generally detectable in upper and lower  respiratory specimens during the acute phase of infection. The lowest  concentration of SARS-CoV-2 viral copies this assay can detect is 250  copies / mL. A negative result does not preclude SARS-CoV-2 infection  and should not be used as the sole basis for treatment or other  patient management decisions.  A negative result may occur  with  improper specimen collection / handling, submission of specimen other  than nasopharyngeal swab, presence of viral mutation(s) within the  areas targeted by this assay, and inadequate number of viral copies  (<250 copies / mL). A negative result must be combined with clinical  observations, patient history, and epidemiological information. If result is POSITIVE SARS-CoV-2 target nucleic acids are DETECTED. The SARS-CoV-2 RNA is generally detectable in upper and lower  respiratory specimens dur ing the acute phase of infection.  Positive  results are indicative of active infection with SARS-CoV-2.  Clinical  correlation with patient history and other diagnostic information is  necessary to determine patient infection status.  Positive results do  not rule out bacterial infection or co-infection with other viruses. If result is PRESUMPTIVE POSTIVE SARS-CoV-2 nucleic acids MAY BE PRESENT.   A presumptive positive result was obtained on the submitted specimen  and confirmed on repeat testing.  While 2019 novel coronavirus  (SARS-CoV-2) nucleic acids may be present in the submitted sample  additional confirmatory testing may be necessary for epidemiological  and / or clinical management purposes  to differentiate between   SARS-CoV-2 and other Sarbecovirus currently known to infect humans.  If clinically indicated additional testing with an alternate test  methodology [phone]([id number]) is advised. The SARS-CoV-2 RNA is generally  detectable in upper and lower respiratory sp ecimens during the acute  phase of infection. The expected result is Negative. Fact Sheet for Patients:  BoilerBrush.com.cyhttps://www.fda.gov/media/136312/download Fact Sheet for Healthcare Providers: https://pope.com/https://www.fda.gov/media/136313/download This test is not yet approved or cleared by the [place] FDA and has been authorized for detection and/or diagnosis of SARS-CoV-2 by FDA under an Emergency Use Authorization (EUA).  This EUA will remain in effect (meaning this test can be used) for the duration of the COVID-19 declaration under Section 564(b)(1) of the Act, 21 U.S.C. section 360bbb-3(b)(1), unless the authorization is terminated or revoked sooner. Performed at [facility], [address] [place], [place] [address]     RADIOLOGY:  Ct Angio Head W Or Wo Contrast  Result Date: 04/18/2019 CLINICAL DATA:  Left-sided weakness.  History of pontine stroke. EXAM: CT ANGIOGRAPHY HEAD TECHNIQUE: Multidetector CT imaging of the head was performed using the standard protocol during bolus administration of intravenous contrast. Multiplanar CT image reconstructions and MIPs were obtained to evaluate the vascular anatomy. CONTRAST:  75mL OMNIPAQUE IOHEXOL 350 MG/ML SOLN COMPARISON:  Brain MRI 04/18/2019 Head CT 04/17/2019 FINDINGS: CT HEAD FINDINGS Brain: There is no mass, hemorrhage or extra-axial collection. There is generalized atrophy without lobar predilection. There is no acute or chronic infarction. There is hypoattenuation of the periventricular white matter, most commonly indicating chronic ischemic microangiopathy. Cystic dilatation of the occipital horn of the right lateral ventricle. Skull: The visualized skull base, calvarium and extracranial  soft tissues are normal. Sinuses/Orbits: No fluid levels or advanced mucosal thickening of the visualized paranasal sinuses. No mastoid or middle ear effusion. The orbits are normal. CTA HEAD FINDINGS POSTERIOR CIRCULATION: --Basilar artery: Normal. --Posterior cerebral arteries: Normal. Both originate from the basilar artery. --Superior cerebellar arteries: Normal. --Inferior cerebellar arteries: Normal anterior and posterior inferior cerebellar arteries. ANTERIOR CIRCULATION: --Intracranial internal carotid arteries: Atherosclerotic calcification of the internal carotid arteries at the skull base without hemodynamically significant stenosis. --Anterior cerebral arteries: Normal. Both A1 segments are present. Patent anterior communicating artery. --Middle cerebral arteries: Normal. --Posterior communicating arteries: Absent bilaterally. VENOUS SINUSES: As permitted by contrast timing, patent. ANATOMIC VARIANTS: None DELAYED PHASE: Not performed. Review of the MIP images  confirms the above findings. IMPRESSION: 1. No emergent large vessel occlusion or flow-limiting stenosis. 2. Chronic ischemic microangiopathy and generalized atrophy. Electronically Signed   By: Deatra RobinsonKevin  Ona M.D.   On: 04/18/2019 17:47   Dg Elbow Complete Left  Result Date: 04/17/2019 CLINICAL DATA:  Left elbow swelling.  No known injury. EXAM: LEFT ELBOW - COMPLETE 3+ VIEW COMPARISON:  None. FINDINGS: The mineralization and alignment are normal. There is no evidence of acute fracture or dislocation. The joint spaces are preserved. There is mild spurring of the coronoid process. No significant elbow joint effusion. There are several calcifications within the soft tissues anteromedial to the distal humerus. These are not typical for sequela of prior avulsion injuries or hydroxyapatite deposition. There is edema throughout the subcutaneous fat. IMPRESSION: No acute osseous findings, significant arthropathic changes or joint effusion.  Indeterminate calcifications in the soft tissues of the distal forearm. Electronically Signed   By: Carey BullocksWilliam  Veazey M.D.   On: 04/17/2019 15:06   Dg Tibia/fibula Left  Result Date: 04/17/2019 CLINICAL DATA:  Left leg pain. EXAM: LEFT TIBIA AND FIBULA - 2 VIEW COMPARISON:  None. FINDINGS: There is no evidence of acute fracture or dislocation. Mild soft tissue swelling about the ankle. Vascular calcifications are noted. IMPRESSION: No acute fracture or dislocation identified about the left tibia and fibula. Electronically Signed   By: Ted Mcalpineobrinka  Dimitrova M.D.   On: 04/17/2019 15:02   Dg Abd 1 View  Result Date: 04/17/2019 CLINICAL DATA:  Stroke EXAM: ABDOMEN - 1 VIEW COMPARISON:  None. FINDINGS: Moderate stool volume. Calcifications in the right upper quadrant are likely gallstones. No dilated small bowel. IMPRESSION: Negative. Electronically Signed   By: Deatra RobinsonKevin  Lorance M.D.   On: 04/17/2019 21:17   Ct Head Wo Contrast  Result Date: 04/17/2019 CLINICAL DATA:  New left arm and leg contractures. EXAM: CT HEAD WITHOUT CONTRAST TECHNIQUE: Contiguous axial images were obtained from the base of the skull through the vertex without intravenous contrast. COMPARISON:  Head CT 03/27/2019 FINDINGS: Brain: No evidence of acute infarction, hemorrhage, hydrocephalus, extra-axial collection or mass lesion/mass effect. Advanced deep white matter microangiopathy and moderate brain parenchymal volume loss. Stable encephalomalacia of the right aspect of the pons. Vascular: Calcific atherosclerotic disease at the skull base. Skull: Normal. Negative for fracture or focal lesion. Sinuses/Orbits: No acute finding. Other: None. IMPRESSION: 1. No acute intracranial abnormality. 2. Advanced chronic microvascular disease and moderate brain parenchymal volume loss. 3. 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 Electronically Signed   By: Ted Mcalpineobrinka  Dimitrova M.D.   On: 04/17/2019 18:08   Mr  Brain Wo Contrast  Result Date: 04/18/2019 CLINICAL DATA:  Ataxia.  Rule out stroke EXAM: MRI HEAD WITHOUT CONTRAST MRA HEAD WITHOUT CONTRAST TECHNIQUE: Multiplanar, multiecho pulse sequences of the brain and surrounding structures were obtained without intravenous contrast. Angiographic images of the head were obtained using MRA technique without contrast. COMPARISON:  CT head 04/17/2019, MRI 07/15/2018 FINDINGS: MRI HEAD FINDINGS Brain: Negative for acute infarct. Moderate atrophy. Ventricular enlargement is unchanged from prior studies. CSF flow related signal in the aqueduct and fourth ventricle. Advanced chronic microvascular ischemic changes throughout the cerebral white matter. Chronic infarct right pons. Negative for hemorrhage or mass. Vascular: Normal arterial flow voids. Skull and upper cervical spine: Negative Sinuses/Orbits: Negative Other: None MRA HEAD FINDINGS Right vertebral artery dominant. Diffusely disease distal left vertebral artery with moderate stenosis which has progressed since 07/15/2018. Basilar widely patent with proximal fenestration unchanged. PICA not  visualized. Prominent AICA patent bilaterally. Severe stenosis distal basilar and proximal P1 segments. Distal basilar was widely patent previously on MRA 07/15/2018. This could be due to thrombus. Internal carotid artery widely patent. Atherosclerotic irregularity and mild stenosis cavernous carotid bilaterally. Anterior and middle cerebral arteries patent. Irregularity is felt to be largely due to artifact from motion. Image quality degraded by motion. IMPRESSION: 1. Negative for acute infarct. 2. Moderate atrophy with extensive chronic ischemic change in the white matter. Chronic infarct right pons 3. MRA image quality degraded by motion. There is severe stenosis distal basilar which was not present on the prior study and could be due to thrombus, or possibly artifact. Recommend confirmation with CTA head. There is significant  stenosis of the proximal P1 segment bilaterally. 4. These results were called by telephone at the time of interpretation on 04/18/2019 at 2:16 pm to Dr. Almon RegisterGuoru , who verbally acknowledged these results. Electronically Signed   By: Marlan Palauharles  Clark M.D.   On: 04/18/2019 14:16   [place] Carotid Bilateral (at [facility] Only)  Result Date: 04/18/2019 CLINICAL DATA:  TIA symptoms, hypertension EXAM: BILATERAL CAROTID DUPLEX ULTRASOUND TECHNIQUE: Wallace CullensGray scale imaging, color Doppler and duplex ultrasound were performed of bilateral carotid and vertebral arteries in the neck. COMPARISON:  None. FINDINGS: Criteria: Quantification of carotid stenosis is based on velocity parameters that correlate the residual internal carotid diameter with NASCET-based stenosis levels, using the diameter of the distal internal carotid lumen as the denominator for stenosis measurement. The following velocity measurements were obtained: RIGHT ICA: 83/20 cm/sec CCA: 62/11 cm/sec SYSTOLIC ICA/CCA RATIO:  1.3 ECA: 106 cm/sec LEFT ICA: 95/19 cm/sec CCA: 65/15 cm/sec SYSTOLIC ICA/CCA RATIO:  1.5 ECA: 100 cm/sec RIGHT CAROTID ARTERY: Minor intimal thickening and atherosclerotic plaque formation. No hemodynamically significant right ICA stenosis, velocity elevation, or turbulent flow. Degree of narrowing less than 50%. RIGHT VERTEBRAL ARTERY:  Antegrade LEFT CAROTID ARTERY: Similar scattered minor intimal thickening and atherosclerotic plaque formation. No hemodynamically significant left ICA stenosis, velocity elevation, or turbulent flow. LEFT VERTEBRAL ARTERY:  Antegrade IMPRESSION: Minor carotid atherosclerosis. No hemodynamically significant ICA stenosis. Degree of narrowing less than 50% bilaterally by ultrasound criteria. Patent antegrade vertebral flow bilaterally Electronically Signed   By: Judie PetitM.  Shick M.D.   On: 04/18/2019 10:52   [place] Venous Img Lower Unilateral Left  Result Date: 04/17/2019 CLINICAL DATA:  Left lower extremity pain and edema.  Evaluate for DVT. EXAM: LEFT LOWER EXTREMITY VENOUS DOPPLER ULTRASOUND TECHNIQUE: Gray-scale sonography with graded compression, as well as color Doppler and duplex ultrasound were performed to evaluate the lower extremity deep venous systems from the level of the common femoral vein and including the common femoral, femoral, profunda femoral, popliteal and calf veins including the posterior tibial, peroneal and gastrocnemius veins when visible. The superficial great saphenous vein was also interrogated. Spectral Doppler was utilized to evaluate flow at rest and with distal augmentation maneuvers in the common femoral, femoral and popliteal veins. COMPARISON:  Left tibia and fibula radiographs-earlier same day FINDINGS: Examination is degraded secondary to patient's lower extremity contractures and inability to tolerate the examination. Contralateral Common Femoral Vein: Respiratory phasicity is normal and symmetric with the symptomatic side. No evidence of thrombus. Normal compressibility. Common Femoral Vein: No evidence of thrombus. Normal compressibility, respiratory phasicity and response to augmentation. Saphenofemoral Junction: No evidence of thrombus. Normal compressibility and flow on color Doppler imaging. Profunda Femoral Vein: No evidence of thrombus. Normal compressibility and flow on color Doppler imaging. Femoral Vein: Proximal mid aspects  of the left femoral vein appear patent. The distal aspect the left femoral vein was not imaged. Popliteal Vein: Unable to be imaged. Calf Veins: Unable to be imaged. Superficial Great Saphenous Vein: No evidence of thrombus. Normal compressibility. Other Findings:  None. IMPRESSION: No evidence of DVT to the mid aspect of the left femoral vein. Patient could not tolerate sonographic evaluation of the distal aspect of the left femoral vein, the popliteal vein or the tibial veins secondary to a combination lower extremity contractures and discomfort. Electronically  Signed   By: Sandi Mariscal M.D.   On: 04/17/2019 16:01   US Venous Img Upper Uni Left  Result Date: 04/17/2019 CLINICAL DATA:  Left upper extremity pain and edema. Evaluate for DVT. EXAM: LEFT UPPER EXTREMITY VENOUS DOPPLER ULTRASOUND TECHNIQUE: Gray-scale sonography with graded compression, as well as color Doppler and duplex ultrasound were performed to evaluate the upper extremity deep venous system from the level of the subclavian vein and including the jugular, axillary, basilic, radial, ulnar and upper cephalic vein. Spectral Doppler was utilized to evaluate flow at rest and with distal augmentation maneuvers. COMPARISON:  None. FINDINGS: Examination is minimally degraded secondary to patient's inability to tolerate exam. Contralateral Subclavian Vein: Respiratory phasicity is normal and symmetric with the symptomatic side. No evidence of thrombus. Normal compressibility. Internal Jugular Vein: No evidence of thrombus. Normal compressibility, respiratory phasicity and response to augmentation. Subclavian Vein: No evidence of thrombus. Normal compressibility, respiratory phasicity and response to augmentation. Axillary Vein: No evidence of thrombus. Normal compressibility, respiratory phasicity and response to augmentation. Cephalic Vein: No evidence of thrombus. Normal compressibility, respiratory phasicity and response to augmentation. Basilic Vein: No evidence of thrombus. Normal compressibility, respiratory phasicity and response to augmentation. Brachial Veins: No evidence of thrombus. Normal compressibility, respiratory phasicity and response to augmentation. Radial Veins: No evidence of thrombus. Normal compressibility, respiratory phasicity and response to augmentation. Ulnar Veins: No evidence of thrombus. Normal compressibility, respiratory phasicity and response to augmentation. Venous Reflux:  None visualized. Other Findings:  None visualized. IMPRESSION: No evidence of DVT within the left upper  extremity. Electronically Signed   By: Sandi Mariscal M.D.   On: 04/17/2019 16:02   Dg Chest Port 1 View  Result Date: 04/17/2019 CLINICAL DATA:  Stroke EXAM: PORTABLE CHEST 1 VIEW COMPARISON:  08/11/2013 FINDINGS: The heart size and mediastinal contours are within normal limits. Both lungs are clear. The visualized skeletal structures are unremarkable. Unchanged calcified granuloma the right lung. Or IMPRESSION: No active disease. Electronically Signed   By: Ulyses Jarred M.D.   On: 04/17/2019 21:14   Dg Foot Complete Left  Result Date: 04/17/2019 CLINICAL DATA:  Left lower leg pain and swelling.  No known injury. EXAM: LEFT FOOT - COMPLETE 3+ VIEW COMPARISON:  None. FINDINGS: The bones are demineralized. No evidence of acute fracture or dislocation. There are mild degenerative changes in the midfoot and at the 1st metatarsophalangeal joint. The tibial sesamoid of the 1st metatarsal is bipartite. Dorsal forefoot soft tissue swelling is present without evidence of foreign body. There are scattered vascular calcifications. IMPRESSION: No evidence of acute fracture or dislocation. Dorsal forefoot soft tissue swelling. Electronically Signed   By: Richardean Sale M.D.   On: 04/17/2019 15:03   Mr Jodene Nam Head/brain RJ Cm  Result Date: 04/18/2019 CLINICAL DATA:  Ataxia.  Rule out stroke EXAM: MRI HEAD WITHOUT CONTRAST MRA HEAD WITHOUT CONTRAST TECHNIQUE: Multiplanar, multiecho pulse sequences of the brain and surrounding structures were obtained without intravenous contrast. Angiographic images  of the head were obtained using MRA technique without contrast. COMPARISON:  CT head 04/17/2019, MRI 07/15/2018 FINDINGS: MRI HEAD FINDINGS Brain: Negative for acute infarct. Moderate atrophy. Ventricular enlargement is unchanged from prior studies. CSF flow related signal in the aqueduct and fourth ventricle. Advanced chronic microvascular ischemic changes throughout the cerebral white matter. Chronic infarct right pons.  Negative for hemorrhage or mass. Vascular: Normal arterial flow voids. Skull and upper cervical spine: Negative Sinuses/Orbits: Negative Other: None MRA HEAD FINDINGS Right vertebral artery dominant. Diffusely disease distal left vertebral artery with moderate stenosis which has progressed since 07/15/2018. Basilar widely patent with proximal fenestration unchanged. PICA not visualized. Prominent AICA patent bilaterally. Severe stenosis distal basilar and proximal P1 segments. Distal basilar was widely patent previously on MRA 07/15/2018. This could be due to thrombus. Internal carotid artery widely patent. Atherosclerotic irregularity and mild stenosis cavernous carotid bilaterally. Anterior and middle cerebral arteries patent. Irregularity is felt to be largely due to artifact from motion. Image quality degraded by motion. IMPRESSION: 1. Negative for acute infarct. 2. Moderate atrophy with extensive chronic ischemic change in the white matter. Chronic infarct right pons 3. MRA image quality degraded by motion. There is severe stenosis distal basilar which was not present on the prior study and could be due to thrombus, or possibly artifact. Recommend confirmation with CTA head. There is significant stenosis of the proximal P1 segment bilaterally. 4. These results were called by telephone at the time of interpretation on 04/18/2019 at 2:16 pm to Dr. Almon Register , who verbally acknowledged these results. Electronically Signed   By: Marlan Palau M.D.   On: 04/18/2019 14:16    EKG:   Orders placed or performed during the hospital encounter of 03/27/19  . ED EKG  . ED EKG      Management plans discussed with the patient, family and they are in agreement.  CODE STATUS:     Code Status Orders  (From admission, onward)         Start     Ordered   04/17/19 1915  Full code  Continuous     04/17/19 1916        Code Status History    Date Active Date Inactive Code Status Order ID Comments User Context    07/15/2018 0417 07/18/2018 1908 Full Code [id number]  Briscoe Deutscher, MD ED   Advance Care Planning Activity      TOTAL TIME TAKING CARE OF THIS PATIENT: 43 minutes.   Note: This dictation was prepared with Dragon dictation along with smaller phrase technology. Any transcriptional errors that result from this process are unintentional.   @  on 04/19/2019 at 12:12 PM  Between 7am to 6pm - Pager - [phone]  After 6pm go to [email or web address] - password [email or web address] [facility]  [place] [facility]  Office  [phone]  CC: Primary care physician; Shelbie Ammons, MD

## 2019-04-19 NOTE — Progress Notes (Signed)
Pt d/c to [facility] via EMS. IV removed intact by pt. VSS. Report given to Kim at [facility]. All questions answered.

## 2019-04-20 LAB — ANA W/REFLEX IF POSITIVE: Anti Nuclear Antibody (ANA): NEGATIVE

## 2019-08-02 IMAGING — MR MR HEAD W/O CM
10 of 11 series · 42 of 48 positions shown · non-contrast
Comparison: 07/14/2018 CT head

CLINICAL DATA: 85 y/o M; increased left-sided weakness with trip
and left facial droop.

EXAM:
MRI HEAD WITHOUT CONTRAST
TECHNIQUE: Multiplanar, multiecho pulse sequences of the brain and surrounding
structures were obtained without intravenous contrast.

[Series 5: DWI · axial · 4.0mm · 0.88mm/px · z∈[-47,+94]mm · 8 of 74 slices shown (1 of 4)]
[im 1/74]
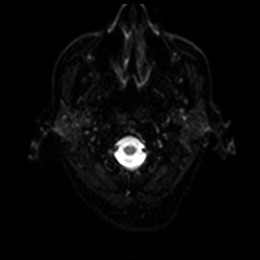
[im 11/74]
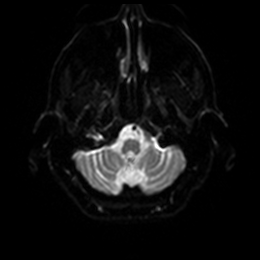
[im 21/74]
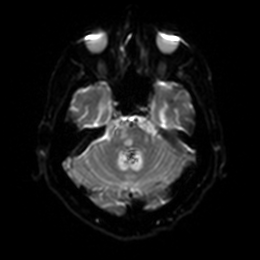
[im 32/74]
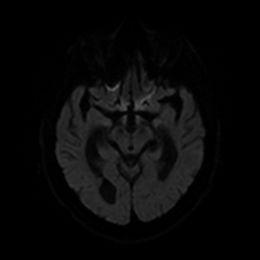
[im 42/74]
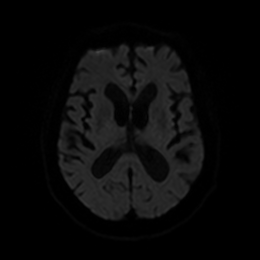
[im 53/74]
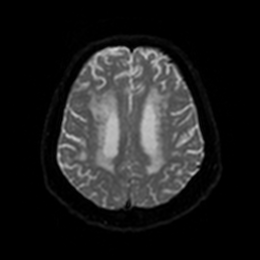
[im 63/74]
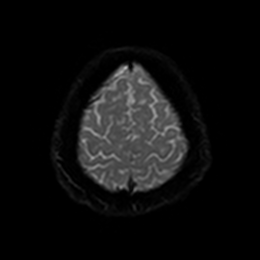
[im 74/74]
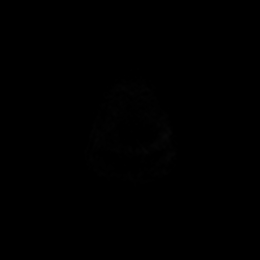

[Series 6: DWI · axial · 4.0mm · 0.88mm/px · z∈[-47,+94]mm · 4 of 37 slices shown (2 of 4)]
[im 1/37]
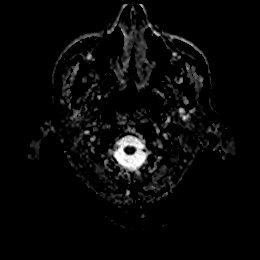
[im 13/37]
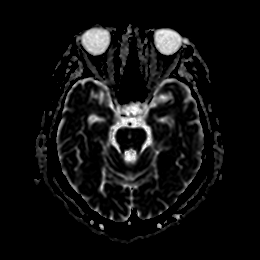
[im 25/37]
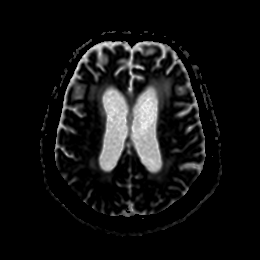
[im 37/37]
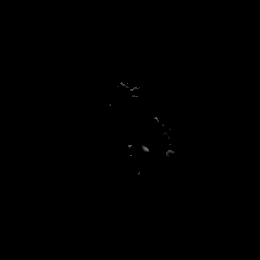

[Series 7: DWI · coronal · 4.0mm · 0.88mm/px · 7 of 68 slices shown (3 of 4)]
[im 1/68]
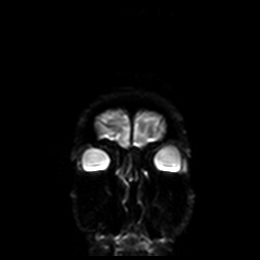
[im 12/68]
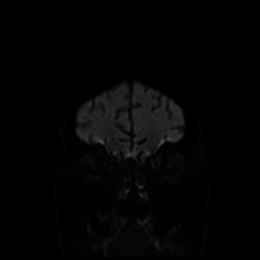
[im 23/68]
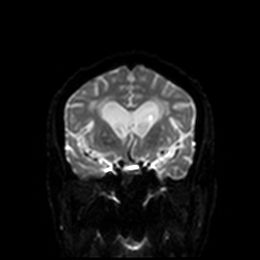
[im 34/68]
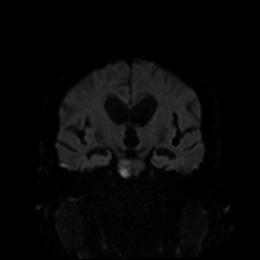
[im 45/68]
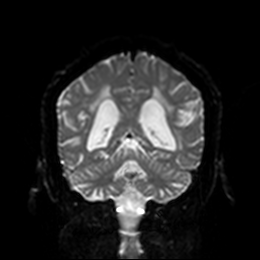
[im 56/68]
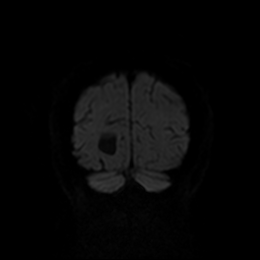
[im 68/68]
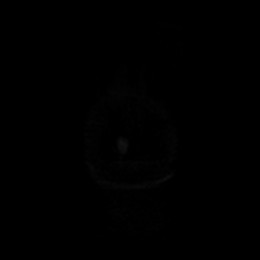

[Series 8: DWI · coronal · 4.0mm · 0.88mm/px · 3 of 34 slices shown (4 of 4)]
[im 1/34]
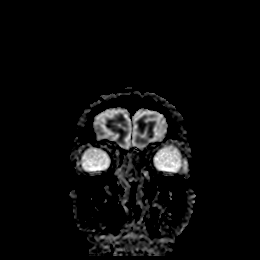
[im 17/34]
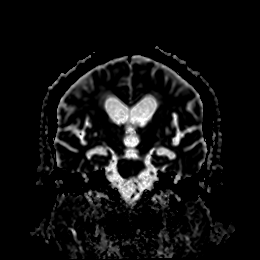
[im 34/34]
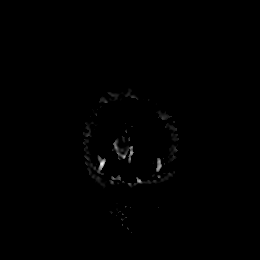

[Series 9: T1 · sagittal · 5.0mm · 0.75mm/px · 2 of 23 slices shown]
[im 1/23]
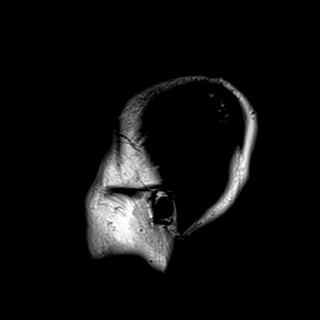
[im 23/23]
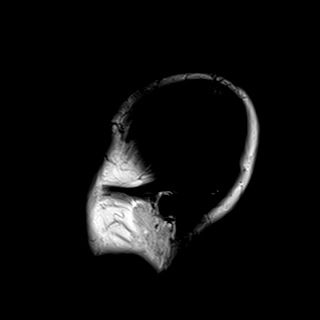

[Series 10: T2 · axial · 5.0mm · 0.72mm/px · z∈[-49,+93]mm · 2 of 25 slices shown (1 of 2)]
[im 1/25]
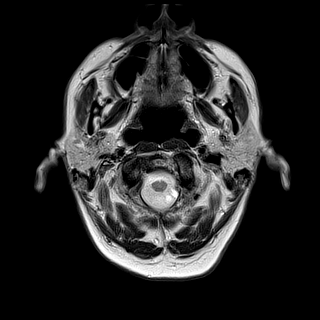
[im 25/25]
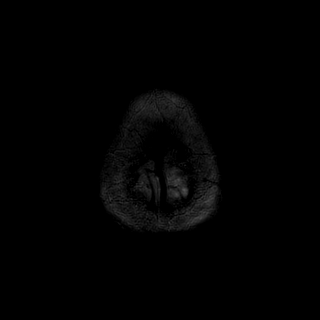

[Series 11: FLAIR · axial · 5.0mm · 0.45mm/px · z∈[-49,+93]mm · 2 of 25 slices shown]
[im 1/25]
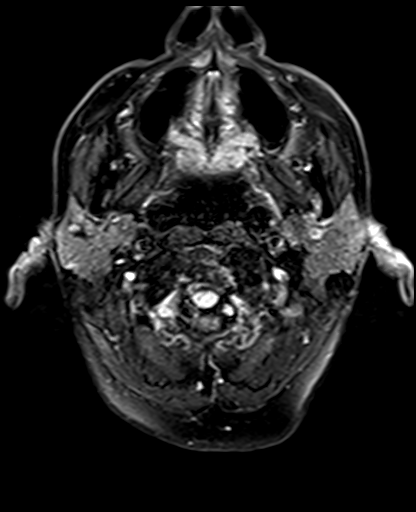
[im 25/25]
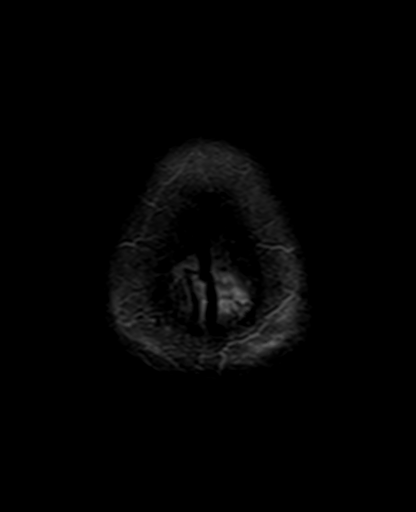

[Series 12: swi_images · axial · 3.0mm · 0.90mm/px · z∈[-58,+116]mm · 6 of 60 slices shown]
[im 1/60]
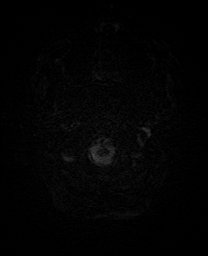
[im 12/60]
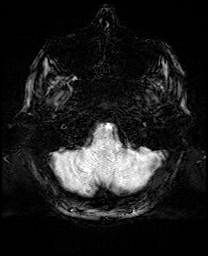
[im 24/60]
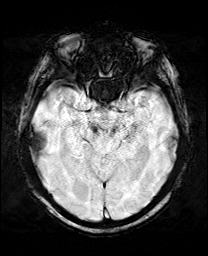
[im 36/60]
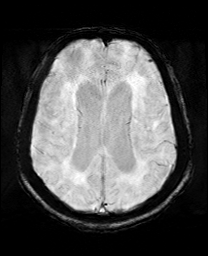
[im 48/60]
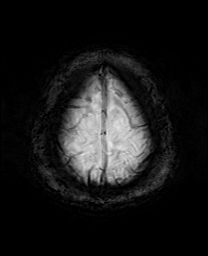
[im 60/60]
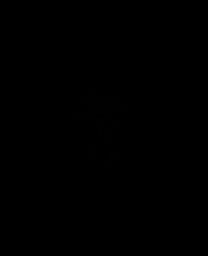

[Series 13: mip_images(sw) · axial · 24.0mm · 0.90mm/px · z∈[-48,+106]mm · 5 of 53 slices shown]
[im 1/53]
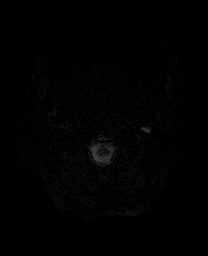
[im 14/53]
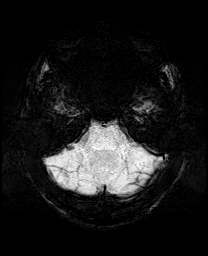
[im 27/53]
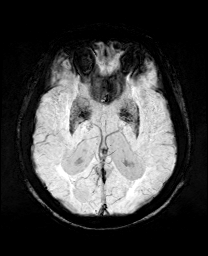
[im 40/53]
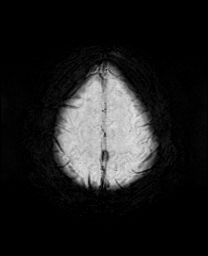
[im 53/53]
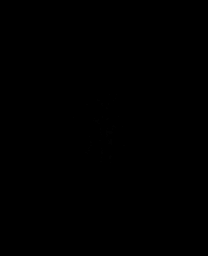

[Series 15: T2 · coronal · 5.0mm · 0.34mm/px · 3 of 29 slices shown (2 of 2)]
[im 1/29]
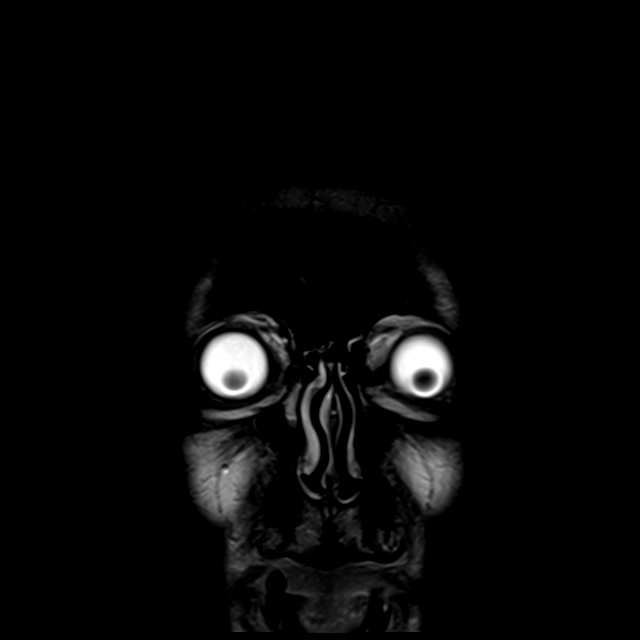
[im 15/29]
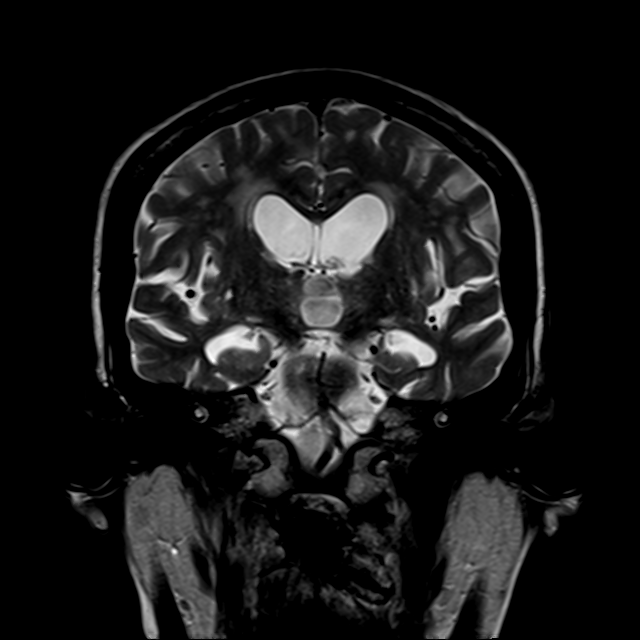
[im 29/29]
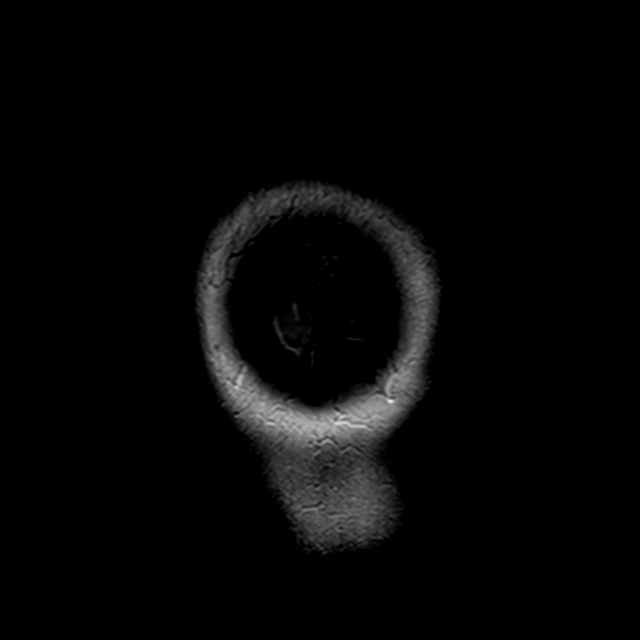

[42 of 48 positions shown; findings below may reference images not displayed]

FINDINGS: Brain: Right paramedian pontine reduced diffusion and T2 FLAIR
hyperintensity measuring 18 x 12 mm (AP by ML series 5, image 48)
compatible with late acute/early subacute infarction. No associated
hemorrhage or mass effect.

Stable advanced chronic microvascular ischemic changes and volume
loss of the brain. No hydrocephalus, extra-axial collection,
effacement of basilar cisterns, focal mass effect, or herniation.
Small chronic lacunar infarcts are present within the thalami and
lentiform nuclei.

Vascular: Normal flow voids.

Skull and upper cervical spine: Normal marrow signal.

Sinuses/Orbits: Negative.

Other: None.
IMPRESSION: 1. Right paramedian pontine late acute/early subacute infarction. No
associated hemorrhage or mass effect.
2. Stable background of advanced chronic microvascular ischemic
changes and volume loss of the brain.

These results will be called to the ordering clinician or
representative by the Radiologist Assistant, and communication
documented in the PACS or zVision Dashboard.

By: Edis Isaza M.D.

## 2019-11-27 ENCOUNTER — Encounter: Payer: Self-pay | Admitting: Emergency Medicine

## 2019-11-27 ENCOUNTER — Emergency Department: Payer: Medicare Other

## 2019-11-27 ENCOUNTER — Inpatient Hospital Stay
Admission: EM | Admit: 2019-11-27 | Discharge: 2019-11-28 | DRG: 159 | Disposition: A | Discharge status: Nursing Home (Includes ICF) | Payer: Medicare Other | Attending: Family Medicine | Admitting: Family Medicine

## 2019-11-27 ENCOUNTER — Observation Stay: Payer: Medicare Other

## 2019-11-27 ENCOUNTER — Other Ambulatory Visit: Payer: Self-pay

## 2019-11-27 DIAGNOSIS — F0280 Dementia in other diseases classified elsewhere without behavioral disturbance: Secondary | ICD-10-CM

## 2019-11-27 DIAGNOSIS — X58XXXA Exposure to other specified factors, initial encounter: Secondary | ICD-10-CM | POA: Diagnosis present

## 2019-11-27 DIAGNOSIS — F0390 Unspecified dementia without behavioral disturbance: Secondary | ICD-10-CM | POA: Diagnosis present

## 2019-11-27 DIAGNOSIS — R6884 Jaw pain: Secondary | ICD-10-CM | POA: Diagnosis not present

## 2019-11-27 DIAGNOSIS — S0302XA Dislocation of jaw, left side, initial encounter: Secondary | ICD-10-CM | POA: Diagnosis not present

## 2019-11-27 DIAGNOSIS — E785 Hyperlipidemia, unspecified: Secondary | ICD-10-CM | POA: Diagnosis not present

## 2019-11-27 DIAGNOSIS — R4182 Altered mental status, unspecified: Secondary | ICD-10-CM | POA: Diagnosis not present

## 2019-11-27 DIAGNOSIS — N390 Urinary tract infection, site not specified: Secondary | ICD-10-CM

## 2019-11-27 DIAGNOSIS — I10 Essential (primary) hypertension: Secondary | ICD-10-CM | POA: Diagnosis not present

## 2019-11-27 DIAGNOSIS — Z79899 Other long term (current) drug therapy: Secondary | ICD-10-CM

## 2019-11-27 DIAGNOSIS — F17210 Nicotine dependence, cigarettes, uncomplicated: Secondary | ICD-10-CM | POA: Diagnosis not present

## 2019-11-27 DIAGNOSIS — I2510 Atherosclerotic heart disease of native coronary artery without angina pectoris: Secondary | ICD-10-CM | POA: Diagnosis not present

## 2019-11-27 DIAGNOSIS — Z7982 Long term (current) use of aspirin: Secondary | ICD-10-CM

## 2019-11-27 DIAGNOSIS — G9340 Encephalopathy, unspecified: Secondary | ICD-10-CM

## 2019-11-27 DIAGNOSIS — G309 Alzheimer's disease, unspecified: Secondary | ICD-10-CM | POA: Diagnosis not present

## 2019-11-27 DIAGNOSIS — E860 Dehydration: Secondary | ICD-10-CM | POA: Diagnosis not present

## 2019-11-27 DIAGNOSIS — Z888 Allergy status to other drugs, medicaments and biological substances status: Secondary | ICD-10-CM | POA: Diagnosis not present

## 2019-11-27 DIAGNOSIS — G9341 Metabolic encephalopathy: Secondary | ICD-10-CM | POA: Diagnosis present

## 2019-11-27 DIAGNOSIS — Z8673 Personal history of transient ischemic attack (TIA), and cerebral infarction without residual deficits: Secondary | ICD-10-CM

## 2019-11-27 DIAGNOSIS — Z20822 Contact with and (suspected) exposure to covid-19: Secondary | ICD-10-CM | POA: Diagnosis present

## 2019-11-27 LAB — COMPREHENSIVE METABOLIC PANEL
ALT: 18 U/L (ref 0–44)
AST: 27 U/L (ref 15–41)
Albumin: 3.9 g/dL (ref 3.5–5.0)
Alkaline Phosphatase: 57 U/L (ref 38–126)
Anion gap: 11 (ref 5–15)
BUN: 19 mg/dL (ref 8–23)
CO2: 21 mmol/L — ABNORMAL LOW (ref 22–32)
Calcium: 9 mg/dL (ref 8.9–10.3)
Chloride: 106 mmol/L (ref 98–111)
Creatinine, Ser: 0.8 mg/dL (ref 0.61–1.24)
GFR calc Af Amer: >60 mL/min (ref >60)
GFR calc non Af Amer: >60 mL/min (ref >60)
Glucose, Bld: 99 mg/dL (ref 70–99)
Potassium: 4.6 mmol/L (ref 3.5–5.1)
Sodium: 138 mmol/L (ref 135–145)
Total Bilirubin: 0.5 mg/dL (ref 0.3–1.2)
Total Protein: 7.5 g/dL (ref 6.5–8.1)

## 2019-11-27 LAB — URINALYSIS, COMPLETE (UACMP) WITH MICROSCOPIC
Bilirubin Urine: NEGATIVE
Glucose, UA: NEGATIVE mg/dL
Ketones, ur: NEGATIVE mg/dL
Leukocytes,Ua: NEGATIVE
Nitrite: POSITIVE — AB
Protein, ur: 30 mg/dL — AB
Specific Gravity, Urine: 1.017 (ref 1.005–1.030)
Squamous Epithelial / HPF: NONE SEEN (ref 0–5)
pH: 6 (ref 5.0–8.0)

## 2019-11-27 LAB — CBC
HCT: 38.8 % — ABNORMAL LOW (ref 39.0–52.0)
Hemoglobin: 11.9 g/dL — ABNORMAL LOW (ref 13.0–17.0)
MCH: 27.4 pg (ref 26.0–34.0)
MCHC: 30.7 g/dL (ref 30.0–36.0)
MCV: 89.2 fL (ref 80.0–100.0)
Platelets: 228 10*3/uL (ref 150–400)
RBC: 4.35 MIL/uL (ref 4.22–5.81)
RDW: 13.7 % (ref 11.5–15.5)
WBC: 7.6 10*3/uL (ref 4.0–10.5)
nRBC: 0 % (ref 0.0–0.2)

## 2019-11-27 MED ORDER — ADULT MULTIVITAMIN W/MINERALS CH
1.0000 | ORAL_TABLET | Freq: Every day | ORAL | Status: DC
  Administered 2019-11-28: 1 via ORAL
  Filled 2019-11-27: qty 1

## 2019-11-27 MED ORDER — DOCUSATE SODIUM 100 MG PO CAPS
100.0000 mg | ORAL_CAPSULE | Freq: Two times a day (BID) | ORAL | Status: DC
  Administered 2019-11-28 (×2): 100 mg via ORAL
  Filled 2019-11-27 (×2): qty 1

## 2019-11-27 MED ORDER — SODIUM CHLORIDE 0.9 % IV SOLN
2.0000 g | Freq: Once | INTRAVENOUS | Status: AC
  Administered 2019-11-27: 14:00:00 2 g via INTRAVENOUS
  Filled 2019-11-27: qty 20

## 2019-11-27 MED ORDER — SODIUM CHLORIDE 0.9 % IV SOLN: Freq: Once | INTRAVENOUS | Status: AC

## 2019-11-27 MED ORDER — ACETAMINOPHEN 650 MG RE SUPP: 650.0000 mg | Freq: Four times a day (QID) | RECTAL | Status: DC | PRN

## 2019-11-27 MED ORDER — ACETAMINOPHEN 325 MG PO TABS: 650.0000 mg | ORAL_TABLET | ORAL | Status: DC | PRN

## 2019-11-27 MED ORDER — ONDANSETRON HCL 4 MG/2ML IJ SOLN: 4.0000 mg | Freq: Four times a day (QID) | INTRAMUSCULAR | Status: DC | PRN

## 2019-11-27 MED ORDER — METOPROLOL SUCCINATE ER 25 MG PO TB24
12.5000 mg | ORAL_TABLET | Freq: Every day | ORAL | Status: DC
  Administered 2019-11-28: 12.5 mg via ORAL
  Filled 2019-11-27: qty 1

## 2019-11-27 MED ORDER — PRO-STAT SUGAR FREE PO LIQD
30.0000 mL | Freq: Every day | ORAL | Status: DC
  Administered 2019-11-28: 30 mL via ORAL

## 2019-11-27 MED ORDER — ASPIRIN EC 81 MG PO TBEC
81.0000 mg | DELAYED_RELEASE_TABLET | Freq: Every day | ORAL | Status: DC
  Administered 2019-11-28: 81 mg via ORAL
  Filled 2019-11-27: qty 1

## 2019-11-27 MED ORDER — LOPERAMIDE HCL 2 MG PO CAPS
2.0000 mg | ORAL_CAPSULE | ORAL | Status: DC | PRN
  Filled 2019-11-27: qty 1

## 2019-11-27 MED ORDER — TRAVOPROST (BAK FREE) 0.004 % OP SOLN
1.0000 [drp] | Freq: Every day | OPHTHALMIC | Status: DC
  Filled 2019-11-27: qty 2.5

## 2019-11-27 MED ORDER — BACLOFEN 10 MG PO TABS
10.0000 mg | ORAL_TABLET | Freq: Three times a day (TID) | ORAL | Status: DC
  Administered 2019-11-28 (×3): 10 mg via ORAL
  Filled 2019-11-27 (×4): qty 1

## 2019-11-27 MED ORDER — ACETAMINOPHEN 325 MG PO TABS: 650.0000 mg | ORAL_TABLET | Freq: Four times a day (QID) | ORAL | Status: DC | PRN

## 2019-11-27 MED ORDER — ONDANSETRON HCL 4 MG PO TABS: 4.0000 mg | ORAL_TABLET | Freq: Four times a day (QID) | ORAL | Status: DC | PRN

## 2019-11-27 MED ORDER — BRIMONIDINE TARTRATE 0.2 % OP SOLN
2.0000 [drp] | Freq: Two times a day (BID) | OPHTHALMIC | Status: DC
  Administered 2019-11-28 (×2): 2 [drp] via OPHTHALMIC
  Filled 2019-11-27: qty 5

## 2019-11-27 MED ORDER — ATORVASTATIN CALCIUM 20 MG PO TABS
40.0000 mg | ORAL_TABLET | Freq: Every day | ORAL | Status: DC
  Administered 2019-11-28: 40 mg via ORAL
  Filled 2019-11-27: qty 2

## 2019-11-27 MED ORDER — SENNOSIDES-DOCUSATE SODIUM 8.6-50 MG PO TABS: 1.0000 | ORAL_TABLET | Freq: Every evening | ORAL | Status: DC | PRN

## 2019-11-27 MED ORDER — CEPHALEXIN 500 MG PO CAPS: 500.0000 mg | ORAL_CAPSULE | Freq: Three times a day (TID) | ORAL | 0 refills | Status: DC

## 2019-11-27 MED ORDER — ENOXAPARIN SODIUM 40 MG/0.4ML ~~LOC~~ SOLN
40.0000 mg | SUBCUTANEOUS | Status: DC
  Administered 2019-11-27: 40 mg via SUBCUTANEOUS
  Filled 2019-11-27: qty 0.4

## 2019-11-27 MED ORDER — SODIUM CHLORIDE 0.9% FLUSH: 3.0000 mL | Freq: Once | INTRAVENOUS | Status: DC

## 2019-11-27 NOTE — ED Triage Notes (Signed)
Pt presents to ED via [facility] from [facility]. Per staff at [facility] patient has been acting "not his normal", pt with baseline hx of dementia. per EMS pt with hx of stroke and sent for eval for stroke. Per EMS pt c/o L sided jaw pain, stroke screen negative. Per EMS pt just returned from [facility] and is currently in quarantine. Per EMS VSS at this time, pt also with hx of L sided deficits from previous stroke.

## 2019-11-27 NOTE — Discharge Instructions (Signed)
Ronald Matthews labs showed a positive urinary tract infection

## 2019-11-27 NOTE — H&P (Addendum)
[facility] [place] [facility]   PATIENT NAME: Athel Merriweather    MR#:  [id number]  DATE OF BIRTH:  28-May-1933  DATE OF ADMISSION:  11/27/2019  PRIMARY CARE PHYSICIAN: Galvin Proffer, MD   REQUESTING/REFERRING PHYSICIAN: dr Erma Heritage  Patient coming from : [facility]   CHIEF COMPLAINT:  patient was sent in from [facility] for not his usual self.  History is obtained from ER physician, old records. Patient has significant dementia unable to give any history review of systems  HISTORY OF PRESENT ILLNESS:  Rawn Quiroa  is a 86 y.o. male with a known history of tension, dementia, coronary artery disease comes into the emergency room from [facility]. According to the staff patient has not been acting his usual self. Patient was at [facility] and recently moved to [facility].  ED course: in the ER, patient is afebrile, pulse of 67 blood pressure is 136/62 sats are 97% on room air His routine labs look within normal limits. UA shows one plus nitrite and few WBC's patient received 2 g of IV Rocephin.  ER MD requesting admission for altered mental status and possible UTI. Patient has been pointing towards his left jaw. I do not see any swelling or any drainage.  Pt is unable to give any history review system he has severe dementia. PAST MEDICAL HISTORY:   Past Medical History:  Diagnosis Date  . Coronary artery disease   . Dementia (HCC)   . Hypertension   . Thrombocytopenia (HCC)     PAST SURGICAL HISTOIRY:  History reviewed. No pertinent surgical history.  SOCIAL HISTORY:   Social History   Tobacco Use  . Smoking status: Current Every Day Smoker  . Smokeless tobacco: Never Used  Substance Use Topics  . Alcohol use: No    FAMILY HISTORY:  History reviewed. No pertinent family history.  DRUG ALLERGIES:   Allergies  Allergen Reactions  . Ppd [Tuberculin Purified Protein  Derivative] Other (See Comments)    "Allergic," per MAR  . Tuberculin Tests Other (See Comments)    "Allergic," per MAR    REVIEW OF SYSTEMS:  Review of Systems  Unable to perform ROS: Dementia     MEDICATIONS AT HOME:   Prior to Admission medications   Medication Sig Start Date End Date Taking? Authorizing Provider  acetaminophen (TYLENOL) 325 MG tablet Take 2 tablets (650 mg total) by mouth every 4 (four) hours as needed for mild pain (or temp > 37.5 C (99.5 F)). Patient taking differently: Take 650 mg by mouth 3 (three) times daily.  07/18/18  Yes Elgergawy, Leana Roe, MD  Amino Acids-Protein Hydrolys (FEEDING SUPPLEMENT, PRO-STAT SUGAR FREE 64,) LIQD Take 30 mLs by mouth daily.   Yes [provider]  aspirin EC 81 MG EC tablet Take 1 tablet (81 mg total) by mouth daily. 07/19/18  Yes Elgergawy, Leana Roe, MD  atorvastatin (LIPITOR) 40 MG tablet Take 1 tablet (40 mg total) by mouth daily at 6 PM. Patient taking differently: Take 40 mg by mouth daily.  07/18/18  Yes Elgergawy, Leana Roe, MD  baclofen (LIORESAL) 10 MG tablet Take 10 mg by mouth 3 (three) times daily.   Yes [provider]  brimonidine (ALPHAGAN) 0.2 % ophthalmic solution Place 2 drops into both eyes 2 (two) times daily.   Yes [provider]  hydrocortisone cream 1 % Apply 1 application topically 2 (two) times daily.  Yes [provider]  loperamide (IMODIUM) 2 MG capsule Take 2 mg by mouth as needed for diarrhea or loose stools (max 4 doses in 12 hours).   Yes [provider]  metoprolol succinate (TOPROL-XL) 25 MG 24 hr tablet Take 12.5 mg by mouth daily.    Yes [provider]  Multiple Vitamin (THEREMS) TABS Take 1 tablet by mouth daily.   Yes [provider]  nitroGLYCERIN (NITROSTAT) 0.4 MG SL tablet Place 0.4 mg under the tongue every 5 (five) minutes x 3 doses as needed for chest pain (AND CALL EMS IF NO RESOLUTION).   Yes [provider]   senna-docusate (SENOKOT-S) 8.6-50 MG tablet Take 1 tablet by mouth at bedtime as needed for mild constipation. 04/19/19  Yes Gouru, Aruna, MD  travoprost, benzalkonium, (TRAVATAN) 0.004 % ophthalmic solution Place 1 drop into both eyes at bedtime.   Yes [provider]  white petrolatum (VASELINE) GEL Apply 1 application topically as needed (for dry areas on body).   Yes [provider]  cephALEXin (KEFLEX) 500 MG capsule Take 1 capsule (500 mg total) by mouth 3 (three) times daily for 7 days. 11/27/19 12/04/19  Shaune Pollack, MD      VITAL SIGNS:  Blood pressure (!) 146/81, pulse 82, temperature 98.3 F (36.8 C), temperature source Oral, resp. rate 14, height 6\' 3"  (1.905 m), weight 72.6 kg, SpO2 98 %.  PHYSICAL EXAMINATION:  GENERAL:  86 y.o.-year-old patient lying in the bed with no acute distress.  EYES: Pupils equal, round, reactive to light and accommodation. No scleral icterus.  HEENT: Head atraumatic, normocephalic. Oropharynx and nasopharynx clear.  NECK:  Supple, no jugular venous distention. No thyroid enlargement, no tenderness.  LUNGS: Normal breath sounds bilaterally, no wheezing, rales,rhonchi or crepitation. No use of accessory muscles of respiration.  CARDIOVASCULAR: S1, S2 normal. No murmurs, rubs, or gallops.  ABDOMEN: Soft, nontender, nondistended. Bowel sounds present. No organomegaly or mass.  EXTREMITIES: No pedal edema, cyanosis, or clubbing.  NEUROLOGIC: grossly nonfocal. Moves all extremities well. PSYCHIATRIC: The patient is alert awake. Baseline has venture SKIN: No obvious rash, lesion, or ulcer.   LABORATORY PANEL:   CBC Recent Labs  Lab 11/27/19 0935  WBC 7.6  HGB 11.9*  HCT 38.8*  PLT 228   ------------------------------------------------------------------------------------------------------------------  Chemistries  Recent Labs  Lab 11/27/19 0935  NA 138  K 4.6  CL 106  CO2 21*  GLUCOSE 99  BUN 19  CREATININE 0.80   CALCIUM 9.0  AST 27  ALT 18  ALKPHOS 57  BILITOT 0.5   ------------------------------------------------------------------------------------------------------------------  Cardiac Enzymes No results for input(s): TROPONINI in the last 168 hours. ------------------------------------------------------------------------------------------------------------------  RADIOLOGY:  CT Head Wo Contrast  Result Date: 11/27/2019 CLINICAL DATA:  Mental status changes. EXAM: CT HEAD WITHOUT CONTRAST TECHNIQUE: Contiguous axial images were obtained from the base of the skull through the vertex without intravenous contrast. COMPARISON:  04/18/2019 FINDINGS: Brain: There is no evidence for acute hemorrhage, hydrocephalus, mass lesion, or abnormal extra-axial fluid collection. No definite CT evidence for acute infarction. Diffuse loss of parenchymal volume is consistent with atrophy. Patchy low attenuation in the deep hemispheric and periventricular white matter is nonspecific, but likely reflects chronic microvascular ischemic demyelination. Ventriculomegaly is stable and likely reflects central atrophy. Vascular: No hyperdense vessel or unexpected calcification. Skull: No evidence for fracture. No worrisome lytic or sclerotic lesion. Sinuses/Orbits: The visualized paranasal sinuses and mastoid air cells are clear. Visualized portions of the globes and intraorbital fat are unremarkable.  Other: None. IMPRESSION: Stable.  No acute intracranial abnormality. Atrophy with chronic small vessel white matter ischemic disease. Electronically Signed   By: Misty Stanley M.D.   On: 11/27/2019 10:54   DG Chest Portable 1 View  Result Date: 11/27/2019 CLINICAL DATA:  86 year old male with history of altered mental status. EXAM: PORTABLE CHEST 1 VIEW COMPARISON:  Chest x-ray 04/07/2019. FINDINGS: Low lung volumes. Linear opacities in the mid to lower lungs bilaterally, likely reflective of subsegmental atelectasis. No pleural  effusions. No pneumothorax. Calcified granuloma in the right lower lung, similar to the prior study. No other suspicious appearing pulmonary nodules or masses confidently identified. No evidence of pulmonary edema. Heart size is upper limits of normal. Upper mediastinal contours are within normal limits. IMPRESSION: 1. Low lung volumes with probable bibasilar subsegmental atelectasis. Electronically Signed   By: Vinnie Langton M.D.   On: 11/27/2019 10:50    EKG:    IMPRESSION AND PLAN:   Chon Buhl  is a 86 y.o. male with a known history of tension, dementia, coronary artery disease comes into the emergency room from [facility]. According to the staff patient has not been acting his usual self. Patient was at [facility] and recently moved to [facility].  1. Altered mental status in the setting of advanced dementia-- unclear etiology -unable to assess patient's baseline. He is alert and awake appears calm and pleasant in the ER -CT had negative for CVA -chest x-ray low lung volumes with probable bibasilar segmental atelectasis -white count normal, patient afebrile -continue to monitor -Check CT maxillofacial left since pt has been c/o jaw pain per [facility] -?MRI brain  2. Probable UTI -patient's UA is not that impressive -follow-up urine culture -patient does not have fever, white count normal -he received one dose of IV Rocephin in the ER -further dosing of antibiotic pending urine culture -patient unable to describe any symptoms given his dementia  3. Hyperlipidemia continue Lipitor  4. Hypertension continue metoprolol  5.DVT prophylaxis subcu Lovenox   Family Communication :spoke with Tammy at [facility] Consults :none Code Status :Full code per [facility] DVT prophylaxis :lovenox  TOTAL TIME TAKING CARE OF THIS PATIENT: *50* minutes.    Fritzi Mandes M.D  [facility]     CC: Primary care physician; Bonnita Nasuti, MD

## 2019-11-27 NOTE — ED Notes (Signed)
This RN attempted to call and update [facility] facility with no answer.

## 2019-11-27 NOTE — ED Provider Notes (Signed)
[facility] Emergency Department Provider Note  ____________________________________________   First MD Initiated Contact with Patient 11/27/19 [phone]     (approximate)  I have reviewed the triage vital signs and the nursing notes.   HISTORY  Chief Complaint Altered Mental Status    HPI Ronald Matthews is a 86 y.o. male  Here with AMS.  [facility].  He reportedly was in assisted living prior to that.  He was supposedly doing better and sent to a essentially assisted living memory area of [place] just this week.  Today, patient was found confused, altered.  He has not been willing to eat or drink much.  This is new for him.   Level 5 caveat invoked as remainder of history, ROS, and physical exam limited due to patient's AMS.        Past Medical History:  Diagnosis Date  . Coronary artery disease   . Dementia (HCC)   . Hypertension   . Thrombocytopenia [facility])     Patient Active Problem List   Diagnosis Date Noted  . Altered mental status 11/27/2019  . Jaw pain   . CVA (cerebral vascular accident) (HCC) 04/17/2019  . Ischemic stroke (HCC) 07/15/2018  . Hypertension 07/15/2018  . Dementia (HCC) 07/15/2018  . Coronary artery disease 07/15/2018    History reviewed. No pertinent surgical history.  Prior to Admission medications   Medication Sig Start Date End Date Taking? Authorizing Provider  acetaminophen (TYLENOL) 325 MG tablet Take 2 tablets (650 mg total) by mouth every 4 (four) hours as needed for mild pain (or temp > 37.5 C (99.5 F)). Patient taking differently: Take 650 mg by mouth 3 (three) times daily.  07/18/18  Yes Elgergawy, Leana Roe, MD  Amino Acids-Protein Hydrolys (FEEDING SUPPLEMENT, PRO-STAT SUGAR FREE 64,) LIQD Take 30 mLs by mouth daily.   Yes [provider]  aspirin EC 81 MG EC tablet Take 1 tablet (81 mg total) by mouth daily. 07/19/18  Yes Elgergawy, Leana Roe, MD  atorvastatin (LIPITOR)  40 MG tablet Take 1 tablet (40 mg total) by mouth daily at 6 PM. Patient taking differently: Take 40 mg by mouth daily.  07/18/18  Yes Elgergawy, Leana Roe, MD  baclofen (LIORESAL) 10 MG tablet Take 10 mg by mouth 3 (three) times daily.   Yes [provider]  brimonidine (ALPHAGAN) 0.2 % ophthalmic solution Place 2 drops into both eyes 2 (two) times daily.   Yes [provider]  hydrocortisone cream 1 % Apply 1 application topically 2 (two) times daily.   Yes [provider]  loperamide (IMODIUM) 2 MG capsule Take 2 mg by mouth as needed for diarrhea or loose stools (max 4 doses in 12 hours).   Yes [provider]  metoprolol succinate (TOPROL-XL) 25 MG 24 hr tablet Take 12.5 mg by mouth daily.    Yes [provider]  Multiple Vitamin (THEREMS) TABS Take 1 tablet by mouth daily.   Yes [provider]  nitroGLYCERIN (NITROSTAT) 0.4 MG SL tablet Place 0.4 mg under the tongue every 5 (five) minutes x 3 doses as needed for chest pain (AND CALL EMS IF NO RESOLUTION).   Yes [provider]  senna-docusate (SENOKOT-S) 8.6-50 MG tablet Take 1 tablet by mouth at bedtime as needed for mild constipation. 04/19/19  Yes Gouru, Aruna, MD  travoprost, benzalkonium, (TRAVATAN) 0.004 % ophthalmic solution Place 1 drop into both eyes at bedtime.   Yes [provider]  white petrolatum (VASELINE) GEL Apply 1 application topically as needed (for dry areas on body).   Yes [provider]  cephALEXin (KEFLEX) 500 MG capsule Take 1 capsule (500 mg total) by mouth 3 (three) times daily for 7 days. 11/27/19 12/04/19  Shaune Pollack, MD    Allergies Ppd [tuberculin purified protein derivative] and Tuberculin tests  History reviewed. No pertinent family history.  Social History Social History   Tobacco Use  . Smoking status: Current Every Day Smoker  . Smokeless tobacco: Never Used  Substance Use Topics  . Alcohol use: No  . Drug use: No     Review of Systems  Review of Systems  Unable to perform ROS: Dementia     ____________________________________________  PHYSICAL EXAM:      VITAL SIGNS: ED Triage Vitals [11/27/19 0931]  Enc Vitals Group     BP (!) 158/81     Pulse Rate 95     Resp 15     Temp 98.3 F (36.8 C)     Temp Source Oral     SpO2 99 %     Weight 160 lb (72.6 kg)     Height 6\' 3"  (1.905 m)     Head Circumference      [facility]      Pain Score      Pain Loc      Pain Edu?      Excl. in GC?      Physical Exam Vitals and nursing note reviewed.  Constitutional:      General: He is not in acute distress.    Appearance: He is well-developed.  HENT:     Head: Normocephalic and atraumatic.     Mouth/Throat:     Mouth: Mucous membranes are dry.  Eyes:     Conjunctiva/sclera: Conjunctivae normal.  Cardiovascular:     Rate and Rhythm: Normal rate and regular rhythm.     Heart sounds: Normal heart sounds.  Pulmonary:     Effort: Pulmonary effort is normal. No respiratory distress.     Breath sounds: No wheezing.  Abdominal:     General: There is no distension.     Tenderness: There is abdominal tenderness (mild, suprapubic).  Musculoskeletal:     Cervical back: Neck supple.  Skin:    General: Skin is warm.     Capillary Refill: Capillary refill takes less than 2 seconds.     Findings: No rash.  Neurological:     Mental Status: He is alert. He is disoriented.     Motor: No abnormal muscle tone.     Comments: MAE. Face symmetric. No focal deficits. Oriented to person only.       ____________________________________________   LABS (all labs ordered are listed, but only abnormal results are displayed)  Labs Reviewed  COMPREHENSIVE METABOLIC PANEL - Abnormal; Notable for the following components:      Result Value   CO2 21 (*)    All other components within normal limits  CBC - Abnormal; Notable for the following components:   Hemoglobin 11.9 (*)    HCT 38.8 (*)    All other  components within normal limits  URINALYSIS, COMPLETE (UACMP) WITH MICROSCOPIC - Abnormal; Notable for the following components:   Color, Urine YELLOW (*)    APPearance HAZY (*)    Hgb urine dipstick MODERATE (*)    Protein, ur 30 (*)    Nitrite POSITIVE (*)    Bacteria, UA RARE (*)  All other components within normal limits  URINE CULTURE  CBG MONITORING, ED    ____________________________________________  EKG: Normal sinus rhythm, ventricular rate 90.  QRS 100, QTc 469.  No acute ST elevations or depressions. ________________________________________  RADIOLOGY All imaging, including plain films, CT scans, and ultrasounds, independently reviewed by me, and interpretations confirmed via formal radiology reads.  ED MD interpretation:   CT head: No acute intracranial normality Chest x-ray: Clear, no pneumonia  Official radiology report(s): CT Head Wo Contrast  Result Date: 11/27/2019 CLINICAL DATA:  Mental status changes. EXAM: CT HEAD WITHOUT CONTRAST TECHNIQUE: Contiguous axial images were obtained from the base of the skull through the vertex without intravenous contrast. COMPARISON:  04/18/2019 FINDINGS: Brain: There is no evidence for acute hemorrhage, hydrocephalus, mass lesion, or abnormal extra-axial fluid collection. No definite CT evidence for acute infarction. Diffuse loss of parenchymal volume is consistent with atrophy. Patchy low attenuation in the deep hemispheric and periventricular white matter is nonspecific, but likely reflects chronic microvascular ischemic demyelination. Ventriculomegaly is stable and likely reflects central atrophy. Vascular: No hyperdense vessel or unexpected calcification. Skull: No evidence for fracture. No worrisome lytic or sclerotic lesion. Sinuses/Orbits: The visualized paranasal sinuses and mastoid air cells are clear. Visualized portions of the globes and intraorbital fat are unremarkable. Other: None. IMPRESSION: Stable.  No acute  intracranial abnormality. Atrophy with chronic small vessel white matter ischemic disease. Electronically Signed   By: Kennith Center M.D.   On: 11/27/2019 10:54   DG Chest Portable 1 View  Result Date: 11/27/2019 CLINICAL DATA:  86 year old male with history of altered mental status. EXAM: PORTABLE CHEST 1 VIEW COMPARISON:  Chest x-ray 04/07/2019. FINDINGS: Low lung volumes. Linear opacities in the mid to lower lungs bilaterally, likely reflective of subsegmental atelectasis. No pleural effusions. No pneumothorax. Calcified granuloma in the right lower lung, similar to the prior study. No other suspicious appearing pulmonary nodules or masses confidently identified. No evidence of pulmonary edema. Heart size is upper limits of normal. Upper mediastinal contours are within normal limits. IMPRESSION: 1. Low lung volumes with probable bibasilar subsegmental atelectasis. Electronically Signed   By: Trudie Reed M.D.   On: 11/27/2019 10:50    ____________________________________________  PROCEDURES   Procedure(s) performed (including Critical Care):  Procedures  ____________________________________________  INITIAL IMPRESSION / MDM / ASSESSMENT AND PLAN / ED COURSE  As part of my medical decision making, I reviewed the following data within the electronic MEDICAL RECORD NUMBER Nursing notes reviewed and incorporated, Old chart reviewed, Notes from prior ED visits, and [place] Controlled Substance Database       *Beatrice Sehgal was evaluated in Emergency Department on 11/27/2019 for the symptoms described in the history of present illness. He was evaluated in the context of the global COVID-19 pandemic, which necessitated consideration that the patient might be at risk for infection with the SARS-CoV-2 virus that causes COVID-19. Institutional protocols and algorithms that pertain to the evaluation of patients at risk for COVID-19 are in a state of rapid change based on information released by regulatory  bodies including the CDC and federal and state organizations. These policies and algorithms were followed during the patient's care in the ED.  Some ED evaluations and interventions may be delayed as a result of limited staffing during the pandemic.*     Medical Decision Making: 86 year old male here with altered mental status, likely secondary to UTI.  No evidence of sepsis.  Patient reportedly is in a fairly independent living situation, which is not  safe based on his current mentation.  Admit for encephalopathy from UTI.  Antibiotics given.  Family updated.  ____________________________________________  FINAL CLINICAL IMPRESSION(S) / ED DIAGNOSES  Final diagnoses:  Lower urinary tract infectious disease  Acute encephalopathy     MEDICATIONS GIVEN DURING THIS VISIT:  Medications  sodium chloride flush (NS) 0.9 % injection 3 mL (has no administration in time range)  cefTRIAXone (ROCEPHIN) 2 g in sodium chloride 0.9 % 100 mL IVPB (0 g Intravenous Stopped 11/27/19 1450)     ED Discharge Orders         Ordered    cephALEXin (KEFLEX) 500 MG capsule  3 times daily     11/27/19 1425           Note:  This document was prepared using Dragon voice recognition software and may include unintentional dictation errors.   Duffy Bruce, MD 11/27/19 [phone]

## 2019-11-28 ENCOUNTER — Encounter: Payer: Self-pay | Admitting: Internal Medicine

## 2019-11-28 ENCOUNTER — Other Ambulatory Visit: Payer: Self-pay

## 2019-11-28 DIAGNOSIS — I2510 Atherosclerotic heart disease of native coronary artery without angina pectoris: Secondary | ICD-10-CM

## 2019-11-28 DIAGNOSIS — R8281 Pyuria: Secondary | ICD-10-CM

## 2019-11-28 DIAGNOSIS — S0300XA Dislocation of jaw, unspecified side, initial encounter: Secondary | ICD-10-CM | POA: Diagnosis not present

## 2019-11-28 DIAGNOSIS — R4182 Altered mental status, unspecified: Secondary | ICD-10-CM | POA: Diagnosis not present

## 2019-11-28 DIAGNOSIS — E860 Dehydration: Secondary | ICD-10-CM | POA: Diagnosis not present

## 2019-11-28 DIAGNOSIS — S0302XA Dislocation of jaw, left side, initial encounter: Secondary | ICD-10-CM | POA: Diagnosis not present

## 2019-11-28 DIAGNOSIS — F0390 Unspecified dementia without behavioral disturbance: Secondary | ICD-10-CM

## 2019-11-28 LAB — SARS CORONAVIRUS 2 (TAT 6-24 HRS): SARS Coronavirus 2: NEGATIVE

## 2019-11-28 LAB — MRSA PCR SCREENING: MRSA by PCR: NEGATIVE

## 2019-11-28 NOTE — TOC Transition Note (Signed)
Transition of Care [facility]) - CM/SW Discharge Note   Patient Details  Name: Ronald Matthews MRN: [id number] Date of Birth: 03/16/1933  Transition of Care [facility]) CM/SW Contact:  Margarito Liner, LCSW Phone Number: 11/28/2019, 2:24 PM   Clinical Narrative: Patient has orders to discharge back to [facility] today. RN will call report to [phone] prior to setting up EMS transport. No further concerns. CSW signing off.   Final next level of care: Assisted Living(with home health) Barriers to Discharge: Barriers Resolved   Patient Goals and CMS Choice Patient states their goals for this hospitalization and ongoing recovery are:: Patient not fully oriented.   Choice offered to / list presented to : NA  Discharge Placement                Patient to be transferred to facility by: EMS Name of family member notified: [name] Loftin Patient and family notified of of transfer: 11/28/19  Discharge Plan and Services     Post Acute Care Choice: Home Health                    HH Arranged: PT, OT [facility] Agency: [facility] Date [facility] Agency Contacted: 11/28/19   Representative spoke with at [facility] Agency: Elmon Kirschner  Social Determinants of Health (SDOH) Interventions     Readmission Risk Interventions No flowsheet data found.

## 2019-11-28 NOTE — Evaluation (Addendum)
Physical Therapy Evaluation Patient Details Name: Ronald Matthews MRN: [id number] DOB: 24-Jun-1933 Today's Date: 11/28/2019   History of Present Illness  Ronald Matthews comes to [facility] on 1/25 from [facility] after noted to be altered from baseline. In ED pt gesturing to left jaw pain, CT revealing of left TMJ dislocation.  Clinical Impression  Pt admitted with above diagnosis. Pt currently with functional limitations due to the deficits listed below (see "PT Problem List"). Upon entry, pt in bed, awake and agreeable to participate. Pt reports eating some food, limited by both edentulous state, but also Left sided jaw pain making bacon difficult to chew. Pt has baseline dementia, history taken from prior admission in June, but pt conversational, pleasant, interactive. Min-modA to come to EOB; max effort and min-modA for stand pivot transfer bed to recliner, pt motivated, ut not always using safe practices, essentially throwing himself into the chair. Functional mobility assessment demonstrates increased effort/time requirements, poor tolerance, and need for physical assistance, whereas the patient performed these at a higher level of independence PTA Pt will benefit from skilled PT intervention to increase independence and safety with basic mobility in preparation for discharge to the venue listed below.       Follow Up Recommendations Supervision for mobility/OOB;Return to Facility (ALF) [facility] PT;Supervision - Intermittent    Equipment Recommendations  None recommended by PT    Recommendations for Other Services       Precautions / Restrictions Precautions Precautions: Fall Precaution Comments: *Left TMJ dislocation Restrictions Weight Bearing Restrictions: No      Mobility  Bed Mobility Overal bed mobility: Modified Independent;Needs Assistance Bed Mobility: Supine to Sit     Supine to sit: Min assist     General bed mobility comments: BUE grips  offered  Transfers Overall transfer level: Needs assistance Equipment used: None Transfers: Stand Pivot Transfers   Stand pivot transfers: Min assist       General transfer comment: quite weak, appears fairly unsafe, recommend absolute supervision for all transfers.  Ambulation/Gait Ambulation/Gait assistance: (does not walk at baseline)              Careers information officer    Modified Rankin (Stroke Patients Only)       Balance Overall balance assessment: History of Falls;Needs assistance         Standing balance support: Bilateral upper extremity supported;During functional activity Standing balance-Leahy Scale: Poor                               Pertinent Vitals/Pain Pain Assessment: Faces Faces Pain Scale: Hurts a little bit Pain Location: Left jaw/face "gums" Pain Descriptors / Indicators: Aching Pain Intervention(s): Limited activity within patient's tolerance;Monitored during session    Home Living Family/patient expects to be discharged to:: Skilled nursing facility                 Additional Comments: Pt. resides at [facility].    Prior Function Level of Independence: Needs assistance   Gait / Transfers Assistance Needed: Pt. used a w/[facility]  ADL's / Homemaking Assistance Needed: Pt. required assist from staff for morning ADLs.        Hand Dominance   Dominant Hand: Right    Extremity/Trunk Assessment   Upper Extremity Assessment Upper Extremity Assessment: Generalized weakness;Overall [facility] for tasks assessed    Lower Extremity Assessment Lower Extremity Assessment:  Generalized weakness;Overall WFL for tasks assessed       Communication   Communication: No difficulties  Cognition Arousal/Alertness: Awake/alert Behavior During Therapy: WFL for tasks assessed/performed Overall Cognitive Status: Within Functional Limits for tasks assessed                                         General Comments      Exercises     Assessment/Plan    PT Assessment Patient needs continued PT services  PT Problem List Decreased strength;Decreased range of motion;Decreased activity tolerance;Decreased balance;Decreased mobility       PT Treatment Interventions Balance training;DME instruction;Functional mobility training;Therapeutic activities;Therapeutic exercise;Patient/family education    PT Goals (Current goals can be found in the Care Plan section)  Acute Rehab PT Goals PT Goal Formulation: Patient unable to participate in goal setting Time For Goal Achievement: 12/12/19    Frequency Min 2X/week   Barriers to discharge        Co-evaluation               AM-PAC PT "6 Clicks" Mobility  Outcome Measure Help needed turning from your back to your side while in a flat bed without using bedrails?: A Little Help needed moving from lying on your back to sitting on the side of a flat bed without using bedrails?: A Little Help needed moving to and from a bed to a chair (including a wheelchair)?: A Little Help needed standing up from a chair using your arms (e.g., wheelchair or bedside chair)?: A Little Help needed to walk in hospital room?: Total Help needed climbing 3-5 steps with a railing? : Total 6 Click Score: 14    End of Session   Activity Tolerance: Patient tolerated treatment well;No increased pain Patient left: in chair;with nursing/sitter in room;with chair alarm set;with call bell/phone within reach Nurse Communication: Mobility status PT Visit Diagnosis: Unsteadiness on feet (R26.81);History of falling (Z91.81);Other abnormalities of gait and mobility (R26.89)    Time: 7564-3329 PT Time Calculation (min) (ACUTE ONLY): 12 min   Charges:   PT Evaluation $PT Eval Moderate Complexity: 1 Mod         11:20 AM, 11/28/19 Ronald Matthews, PT, DPT Physical Therapist - [facility]  [phone] ([place])    Ronald Matthews 11/28/2019, 11:17 AM

## 2019-11-28 NOTE — NC FL2 (Signed)
[place] LEVEL OF CARE SCREENING TOOL     IDENTIFICATION  Patient Name: Ronald Matthews Birthdate: Aug 25, 1933 Sex: male Admission Date (Current Location): 11/27/2019  [place] and [place] Number:  Engineering geologist and Address:  [facility] [address], [place], [place] [address]      Provider Number: [id number]  Attending Physician Name and Address:  Edwin Dada, *  Relative Name and Phone Number:       Current Level of Care: Hospital Recommended Level of Care: [place], Memory Care(with PT and OT through [facility]) Prior Approval Number:    Date Approved/Denied:   PASRR Number:    Discharge Plan: Other (Comment)(ALF Memory Care with PT and OT through [facility])    Current Diagnoses: Patient Active Problem List   Diagnosis Date Noted  . Dehydration 11/28/2019  . Altered mental status 11/27/2019  . Jaw pain   . CVA (cerebral vascular accident) ([place]) 04/17/2019  . Ischemic stroke ([place]) 07/15/2018  . Hypertension 07/15/2018  . Dementia ([place]) 07/15/2018  . Coronary artery disease 07/15/2018    Orientation RESPIRATION BLADDER Height & Weight     Self  Normal Incontinent Weight: 160 lb (72.6 kg) Height:  6\' 3"  (190.5 cm)  BEHAVIORAL SYMPTOMS/MOOD NEUROLOGICAL BOWEL NUTRITION STATUS  (None) (None) Continent Diet(Soft diet only until oral surgery follow up.)  AMBULATORY STATUS COMMUNICATION OF NEEDS Skin   Total Care Verbally Normal                       Personal Care Assistance Level of Assistance  Bathing, Feeding, Dressing Bathing Assistance: Limited assistance Feeding assistance: Independent Dressing Assistance: Limited assistance     Functional Limitations Info  Sight, Hearing, Speech Sight Info: Adequate Hearing Info: Adequate Speech Info: Adequate    SPECIAL CARE FACTORS FREQUENCY  PT (By licensed PT), OT (By licensed OT)     PT Frequency: 3 x week OT Frequency: 3 x week            Contractures Contractures Info: Not present    Additional Factors Info  Code Status, Allergies Code Status Info: Full code Allergies Info: Ppd (Tuberculin Purified Protein Derivative), Tuberculin tests.           Current Medications (11/28/2019):  This is the current hospital active medication list Current Facility-Administered Medications  Medication Dose Route Frequency Provider Last Rate Last Admin  . acetaminophen (TYLENOL) tablet 650 mg  650 mg Oral Q6H PRN Fritzi Mandes, MD       Or  . acetaminophen (TYLENOL) suppository 650 mg  650 mg Rectal Q6H PRN Fritzi Mandes, MD      . aspirin EC tablet 81 mg  81 mg Oral Daily Fritzi Mandes, MD   81 mg at 11/28/19 1047  . atorvastatin (LIPITOR) tablet 40 mg  40 mg Oral q1800 Fritzi Mandes, MD      . baclofen (LIORESAL) tablet 10 mg  10 mg Oral TID Fritzi Mandes, MD   10 mg at 11/28/19 1048  . brimonidine (ALPHAGAN) 0.2 % ophthalmic solution 2 drop  2 drop Both Eyes BID Fritzi Mandes, MD   2 drop at 11/28/19 1049  . docusate sodium (COLACE) capsule 100 mg  100 mg Oral BID Fritzi Mandes, MD   100 mg at 11/28/19 1047  . enoxaparin (LOVENOX) injection 40 mg  40 mg Subcutaneous Q24H Fritzi Mandes, MD   40 mg at 11/27/19 2328  . feeding supplement (PRO-STAT SUGAR FREE 64)  liquid 30 mL  30 mL Oral Daily Enedina Finner, MD   30 mL at 11/28/19 1046  . loperamide (IMODIUM) capsule 2 mg  2 mg Oral PRN Enedina Finner, MD      . metoprolol succinate (TOPROL-XL) 24 hr tablet 12.5 mg  12.5 mg Oral Daily Enedina Finner, MD   12.5 mg at 11/28/19 1047  . multivitamin with minerals tablet 1 tablet  1 tablet Oral Daily Enedina Finner, MD   1 tablet at 11/28/19 1047  . ondansetron (ZOFRAN) tablet 4 mg  4 mg Oral Q6H PRN Enedina Finner, MD       Or  . ondansetron [facility]) injection 4 mg  4 mg Intravenous Q6H PRN Enedina Finner, MD      . senna-docusate (Senokot-S) tablet 1 tablet  1 tablet Oral QHS PRN Enedina Finner, MD      . sodium chloride flush (NS) 0.9 % injection 3 mL  3 mL  Intravenous Once Shaune Pollack, MD      . Travoprost (BAK Free) (TRAVATAN) 0.004 % ophthalmic solution SOLN 1 drop  1 drop Both Eyes QHS Enedina Finner, MD         Discharge Medications: STOP taking these medications   cephALEXin 500 MG capsule Commonly known as: KEFLEX     TAKE these medications   acetaminophen 325 MG tablet Commonly known as: TYLENOL Take 2 tablets (650 mg total) by mouth every 4 (four) hours as needed for mild pain (or temp > 37.5 C (99.5 F)). What changed: when to take this   aspirin 81 MG EC tablet Take 1 tablet (81 mg total) by mouth daily.   atorvastatin 40 MG tablet Commonly known as: LIPITOR Take 1 tablet (40 mg total) by mouth daily at 6 PM. What changed: when to take this   baclofen 10 MG tablet Commonly known as: LIORESAL Take 10 mg by mouth 3 (three) times daily.   brimonidine 0.2 % ophthalmic solution Commonly known as: ALPHAGAN Place 2 drops into both eyes 2 (two) times daily.   feeding supplement (PRO-STAT SUGAR FREE 64) Liqd Take 30 mLs by mouth daily.   hydrocortisone cream 1 % Apply 1 application topically 2 (two) times daily.   loperamide 2 MG capsule Commonly known as: IMODIUM Take 2 mg by mouth as needed for diarrhea or loose stools (max 4 doses in 12 hours).   metoprolol succinate 25 MG 24 hr tablet Commonly known as: TOPROL-XL Take 12.5 mg by mouth daily.   nitroGLYCERIN 0.4 MG SL tablet Commonly known as: NITROSTAT Place 0.4 mg under the tongue every 5 (five) minutes x 3 doses as needed for chest pain (AND CALL EMS IF NO RESOLUTION).   senna-docusate 8.6-50 MG tablet Commonly known as: Senokot-S Take 1 tablet by mouth at bedtime as needed for mild constipation.   [place] Tabs Take 1 tablet by mouth daily.   travoprost (benzalkonium) 0.004 % ophthalmic solution Commonly known as: TRAVATAN Place 1 drop into both eyes at bedtime.   white petrolatum Gel Commonly known as: VASELINE Apply 1 application  topically as needed (for dry areas on body).     Relevant Imaging Results:  Relevant Lab Results:   Additional Information SS#: [id number]  Margarito Liner, LCSW

## 2019-11-28 NOTE — Progress Notes (Signed)
Discharged to [facility] via EMS.

## 2019-11-28 NOTE — Discharge Summary (Signed)
Physician Discharge Summary  Brylin Stanislawski VOZ:[id number] DOB: 05-21-1933 DOA: 11/27/2019  PCP: Bonnita Nasuti, MD  Admit date: 11/27/2019 Discharge date: 11/28/2019  Admitted From: ALF  Disposition:  ALF [facility]   Recommendations for Outpatient Follow-up:  1. Follow up with Oral Surgery Dr. Michael Litter tomorrow at [facility]: PT/OT due to weakness from dehydration and baseline dementia making it unsafe for patient to leave home alone  Equipment/Devices: None  Discharge Condition: Fair  CODE STATUS: FULL Diet recommendation: Soft diet only until Oral Surgery follow up  Brief/Interim Summary: Ronald Matthews is an 86 y.o. M with dementia, HTN, CAD who presented from memory care due to decreased mentation, "not acting usual self".    Patient recently admitted to memory care ALF from Rehab.  Evidently, patient's oral intake had been poor and he was complaining of left jaw pain.  Finally, staff felt that he was confused and less responsive than usual, so sent to ER for evaluation.  In the ER, afebrile, WBC normal.  Urine suggested dehydration.  Patient given empiric antibiotics for UTI and hospitalist service were asked to evaluate for altered mentation.         PRINCIPAL HOSPITAL DIAGNOSIS: Dehydration due to jaw dislocation    Discharge Diagnoses:   Jaw dislocation Patient noted to have left jaw discomfort.  CT maxillofacial shows evidence of anterior displacement of left mandibular condyle.  Patient able to tolerate soft foods well.  Discussed with Dr. Mancel Parsons.  Needs close follow up for definitive management by an oral surgeon.  This was arranged tomorrow at 9AM.     Altered mental status superimposed on advanced dementia Patient admitted and given fluids overnight.  In morning, patient was at his mental baseline, was interactive, pleasant. Stable for discharge back to memory care.   Asymptomatic pyuria Patient able to articulate that he has no dysuria,  urgency.  In absence of fever or leukocytosis, this is asymptomatic pyuria, and no treatment is warranted.  Coronary disease Hypertension Continue aspirin, atorvastatin, metoprolol           Discharge Instructions  Discharge Instructions    Discharge instructions   Complete by: As directed    Provide soft diet only Follow up with Dr. Mancel Parsons, Oral Surgery tomorrow Wednesday Jan 27 at [place] [address]   If develops fever or UTI symptoms, start antibiotics tailored to hospital urine culture.   Increase activity slowly   Complete by: As directed      Allergies as of 11/28/2019      Reactions   Ppd [tuberculin Purified Protein Derivative] Other (See Comments)   "Allergic," per MAR   Tuberculin Tests Other (See Comments)   "Allergic," per [facility]      Medication List    STOP taking these medications   cephALEXin 500 MG capsule Commonly known as: KEFLEX     TAKE these medications   acetaminophen 325 MG tablet Commonly known as: TYLENOL Take 2 tablets (650 mg total) by mouth every 4 (four) hours as needed for mild pain (or temp > 37.5 C (99.5 F)). What changed: when to take this   aspirin 81 MG EC tablet Take 1 tablet (81 mg total) by mouth daily.   atorvastatin 40 MG tablet Commonly known as: LIPITOR Take 1 tablet (40 mg total) by mouth daily at 6 PM. What changed: when to take this   baclofen 10 MG tablet Commonly known as: LIORESAL Take  10 mg by mouth 3 (three) times daily.   brimonidine 0.2 % ophthalmic solution Commonly known as: ALPHAGAN Place 2 drops into both eyes 2 (two) times daily.   feeding supplement (PRO-STAT SUGAR FREE 64) Liqd Take 30 mLs by mouth daily.   hydrocortisone cream 1 % Apply 1 application topically 2 (two) times daily.   loperamide 2 MG capsule Commonly known as: IMODIUM Take 2 mg by mouth as needed for diarrhea or loose stools (max 4 doses in 12 hours).   metoprolol succinate 25 MG 24 hr  tablet Commonly known as: TOPROL-XL Take 12.5 mg by mouth daily.   nitroGLYCERIN 0.4 MG SL tablet Commonly known as: NITROSTAT Place 0.4 mg under the tongue every 5 (five) minutes x 3 doses as needed for chest pain (AND CALL EMS IF NO RESOLUTION).   senna-docusate 8.6-50 MG tablet Commonly known as: Senokot-S Take 1 tablet by mouth at bedtime as needed for mild constipation.   Therems Tabs Take 1 tablet by mouth daily.   travoprost (benzalkonium) 0.004 % ophthalmic solution Commonly known as: TRAVATAN Place 1 drop into both eyes at bedtime.   white petrolatum Gel Commonly known as: VASELINE Apply 1 application topically as needed (for dry areas on body).      Follow-up Information    Hague, Myrene Galas, MD In 1 week.   Specialty: Internal Medicine Contact information: [address] [place] [address] [phone]        Vivia Ewing, DMD. Go in 1 day(s).   Specialty: Dentistry Why: Appointment at 9AM on Wednesday 11/29/19 Contact information: [address] [place] [address] [phone]          Allergies  Allergen Reactions  . Ppd [Tuberculin Purified Protein Derivative] Other (See Comments)    "Allergic," per MAR  . Tuberculin Tests Other (See Comments)    "Allergic," per [facility]    Consultations:  Oral surgery by phone   Procedures/Studies: CT Head Wo Contrast  Result Date: 11/27/2019 CLINICAL DATA:  Mental status changes. EXAM: CT HEAD WITHOUT CONTRAST TECHNIQUE: Contiguous axial images were obtained from the base of the skull through the vertex without intravenous contrast. COMPARISON:  04/18/2019 FINDINGS: Brain: There is no evidence for acute hemorrhage, hydrocephalus, mass lesion, or abnormal extra-axial fluid collection. No definite CT evidence for acute infarction. Diffuse loss of parenchymal volume is consistent with atrophy. Patchy low attenuation in the deep hemispheric and periventricular white matter is nonspecific, but likely  reflects chronic microvascular ischemic demyelination. Ventriculomegaly is stable and likely reflects central atrophy. Vascular: No hyperdense vessel or unexpected calcification. Skull: No evidence for fracture. No worrisome lytic or sclerotic lesion. Sinuses/Orbits: The visualized paranasal sinuses and mastoid air cells are clear. Visualized portions of the globes and intraorbital fat are unremarkable. Other: None. IMPRESSION: Stable.  No acute intracranial abnormality. Atrophy with chronic small vessel white matter ischemic disease. Electronically Signed   By: Kennith Center M.D.   On: 11/27/2019 10:54   DG Chest Portable 1 View  Result Date: 11/27/2019 CLINICAL DATA:  86 year old male with history of altered mental status. EXAM: PORTABLE CHEST 1 VIEW COMPARISON:  Chest x-ray 04/07/2019. FINDINGS: Low lung volumes. Linear opacities in the mid to lower lungs bilaterally, likely reflective of subsegmental atelectasis. No pleural effusions. No pneumothorax. Calcified granuloma in the right lower lung, similar to the prior study. No other suspicious appearing pulmonary nodules or masses confidently identified. No evidence of pulmonary edema. Heart size is upper limits of normal. Upper mediastinal  contours are within normal limits. IMPRESSION: 1. Low lung volumes with probable bibasilar subsegmental atelectasis. Electronically Signed   By: Trudie Reed M.D.   On: 11/27/2019 10:50   CT MAXILLOFACIAL WO CONTRAST  Result Date: 11/27/2019 CLINICAL DATA:  Acute sinusitis. Altered mental status. EXAM: CT MAXILLOFACIAL WITHOUT CONTRAST TECHNIQUE: Multidetector CT imaging of the maxillofacial structures was performed. Multiplanar CT image reconstructions were also generated. COMPARISON:  CT scan of the head dated 08/26/2020 FINDINGS: Osseous: The left mandibular condyle is positioned anterior to the articular eminence as compared to the right mandibular condyle which is properly located in the articular fossa. The  jaw is deviated to the right. This is new since the prior CT angiogram dated 04/18/2019. There is no fracture. There are multiple missing teeth. Orbits: Negative. No traumatic or inflammatory finding. Sinuses: Clear. Soft tissues: Negative. Limited intracranial: Chronic atrophy. See the CT scan of the head report from 11/27/2019. IMPRESSION: The left mandibular condyle is positioned anterior to the articular eminence as compared to the right mandibular condyle. The jaw is deviated to the right. This is new since the study of 04/18/2019. The sinuses are clear. Electronically Signed   By: Francene Boyers M.D.   On: 11/27/2019 17:22       Subjective: Feeling well.  Appetite good.  ABle to eat soft foods only, pain with chewing tough foods (was given bacon by dietary).   No confusion, fever, dysuria, urinary urgency.  Discharge Exam: Vitals:   11/27/19 2330 11/28/19 0743  BP: 120/73 123/63  Pulse: 96 83  Resp: 16   Temp: 98.2 F (36.8 C)   SpO2: 100% 99%   Vitals:   11/27/19 1945 11/27/19 2051 11/27/19 2330 11/28/19 0743  BP: (!) 145/87 136/77 120/73 123/63  Pulse: 89 88 96 83  Resp: 15 16 16    Temp:  98.5 F (36.9 C) 98.2 F (36.8 C)   TempSrc:  Oral Oral   SpO2: 98% 98% 100% 99%  Weight:      Height:        General: Pt is alert, awake, not in acute distress, sitting in recliner, conversational, pleasant Cardiovascular: RRR, nl S1-S2, no murmurs appreciated.   No LE edema.   Respiratory: Normal respiratory rate and rhythm.  CTAB without rales or wheezes. Abdominal: Abdomen soft and non-tender.  No distension or HSM.   Neuro/Psych: Strength symmetric in upper and lower extremities.  Judgment and insight appear moderately impaired by dementia (oriented to self, not place or situation).   The results of significant diagnostics from this hospitalization (including imaging, microbiology, ancillary and laboratory) are listed below for reference.     Microbiology: Recent Results  (from the past 240 hour(s))  Urine culture     Status: None (Preliminary result)   Collection Time: 11/27/19 11:00 PM   Specimen: Urine, Random  Result Value Ref Range Status   Specimen Description   Final    URINE, RANDOM Performed at [facility], [address], [place], [place]    Special Requests NONE  Final   Culture PENDING  Incomplete   Report Status PENDING  Incomplete  SARS CORONAVIRUS 2 (TAT 6-24 HRS) Nasopharyngeal Nasopharyngeal Swab     Status: None   Collection Time: 11/28/19 12:45 AM   Specimen: Nasopharyngeal Swab  Result Value Ref Range Status   SARS Coronavirus 2 NEGATIVE NEGATIVE Final    Comment: (NOTE) SARS-CoV-2 target nucleic acids are NOT DETECTED. The SARS-CoV-2 RNA is generally detectable in upper and lower  respiratory specimens during the acute phase of infection. Negative results do not preclude SARS-CoV-2 infection, do not rule out co-infections with other pathogens, and should not be used as the sole basis for treatment or other patient management decisions. Negative results must be combined with clinical observations, patient history, and epidemiological information. The expected result is Negative. Fact Sheet for Patients: HairSlick.nohttps://www.fda.gov/media/138098/download Fact Sheet for Healthcare Providers: quierodirigir.comhttps://www.fda.gov/media/138095/download This test is not yet approved or cleared by the [place] FDA and  has been authorized for detection and/or diagnosis of SARS-CoV-2 by FDA under an Emergency Use Authorization (EUA). This EUA will remain  in effect (meaning this test can be used) for the duration of the COVID-19 declaration under Section 56 4(b)(1) of the Act, 21 U.S.C. section 360bbb-3(b)(1), unless the authorization is terminated or revoked sooner. Performed at [facility], [address] [place], [place] [address]   MRSA PCR Screening     Status: None   Collection Time: 11/28/19 12:45 AM   Specimen:  Nasopharyngeal  Result Value Ref Range Status   MRSA by PCR NEGATIVE NEGATIVE Final    Comment:        The GeneXpert MRSA Assay (FDA approved for NASAL specimens only), is one component of a comprehensive MRSA colonization surveillance program. It is not intended to diagnose MRSA infection nor to guide or monitor treatment for MRSA infections. Performed at [facility], [address] [place], [place] [address]      Labs: BNP (last 3 results) Recent Labs    04/17/19 1610  BNP 190.0*   Basic Metabolic Panel: Recent Labs  Lab 11/27/19 0935  NA 138  K 4.6  CL 106  CO2 21*  GLUCOSE 99  BUN 19  CREATININE 0.80  CALCIUM 9.0   Liver Function Tests: Recent Labs  Lab 11/27/19 0935  AST 27  ALT 18  ALKPHOS 57  BILITOT 0.5  PROT 7.5  ALBUMIN 3.9   No results for input(s): LIPASE, AMYLASE in the last 168 hours. No results for input(s): AMMONIA in the last 168 hours. CBC: Recent Labs  Lab 11/27/19 0935  WBC 7.6  HGB 11.9*  HCT 38.8*  MCV 89.2  PLT 228   Cardiac Enzymes: No results for input(s): CKTOTAL, CKMB, CKMBINDEX, TROPONINI in the last 168 hours. BNP: Invalid input(s): POCBNP CBG: No results for input(s): GLUCAP in the last 168 hours. D-Dimer No results for input(s): DDIMER in the last 72 hours. Hgb A1c No results for input(s): HGBA1C in the last 72 hours. Lipid Profile No results for input(s): CHOL, HDL, LDLCALC, TRIG, CHOLHDL, LDLDIRECT in the last 72 hours. Thyroid function studies No results for input(s): TSH, T4TOTAL, T3FREE, THYROIDAB in the last 72 hours.  Invalid input(s): FREET3 Anemia work up No results for input(s): VITAMINB12, FOLATE, FERRITIN, TIBC, IRON, RETICCTPCT in the last 72 hours. Urinalysis    Component Value Date/Time   COLORURINE YELLOW (A) 11/27/2019 1244   APPEARANCEUR HAZY (A) 11/27/2019 1244   LABSPEC 1.017 11/27/2019 1244   PHURINE 6.0 11/27/2019 1244   GLUCOSEU NEGATIVE 11/27/2019 1244   HGBUR  MODERATE (A) 11/27/2019 1244   BILIRUBINUR NEGATIVE 11/27/2019 1244   KETONESUR NEGATIVE 11/27/2019 1244   PROTEINUR 30 (A) 11/27/2019 1244   NITRITE POSITIVE (A) 11/27/2019 1244   LEUKOCYTESUR NEGATIVE 11/27/2019 1244   Sepsis Labs Invalid input(s): PROCALCITONIN,  WBC,  LACTICIDVEN Microbiology Recent Results (from the past 240 hour(s))  Urine culture     Status: None (Preliminary result)   Collection  Time: 11/27/19 11:00 PM   Specimen: Urine, Random  Result Value Ref Range Status   Specimen Description   Final    URINE, RANDOM Performed at [facility], [address] [place], [place] [address]    Special Requests NONE  Final   Culture PENDING  Incomplete   Report Status PENDING  Incomplete  SARS CORONAVIRUS 2 (TAT 6-24 HRS) Nasopharyngeal Nasopharyngeal Swab     Status: None   Collection Time: 11/28/19 12:45 AM   Specimen: Nasopharyngeal Swab  Result Value Ref Range Status   SARS Coronavirus 2 NEGATIVE NEGATIVE Final    Comment: (NOTE) SARS-CoV-2 target nucleic acids are NOT DETECTED. The SARS-CoV-2 RNA is generally detectable in upper and lower respiratory specimens during the acute phase of infection. Negative results do not preclude SARS-CoV-2 infection, do not rule out co-infections with other pathogens, and should not be used as the sole basis for treatment or other patient management decisions. Negative results must be combined with clinical observations, patient history, and epidemiological information. The expected result is Negative. Fact Sheet for Patients: HairSlick.no Fact Sheet for Healthcare Providers: quierodirigir.com This test is not yet approved or cleared by the [place] FDA and  has been authorized for detection and/or diagnosis of SARS-CoV-2 by FDA under an Emergency Use Authorization (EUA). This EUA will remain  in effect (meaning this test can be used) for the duration of  the COVID-19 declaration under Section 56 4(b)(1) of the Act, 21 U.S.C. section 360bbb-3(b)(1), unless the authorization is terminated or revoked sooner. Performed at [facility], [address], [place], [place] [address]   MRSA PCR Screening     Status: None   Collection Time: 11/28/19 12:45 AM   Specimen: Nasopharyngeal  Result Value Ref Range Status   MRSA by PCR NEGATIVE NEGATIVE Final    Comment:        The GeneXpert MRSA Assay (FDA approved for NASAL specimens only), is one component of a comprehensive MRSA colonization surveillance program. It is not intended to diagnose MRSA infection nor to guide or monitor treatment for MRSA infections. Performed at [facility], [address] [place], [place] [address]      Time coordinating discharge: 45 minutes The [place] controlled substances registry was reviewed for this patient    SIGNED:   Alberteen Sam, MD  [facility] 11/28/2019, 12:48 PM

## 2019-11-28 NOTE — Care Management Obs Status (Signed)
MEDICARE OBSERVATION STATUS NOTIFICATION   Patient Details  Name: Ronald Matthews MRN: [id number] Date of Birth: 1933/01/23   Medicare Observation Status Notification Given:  Yes(Sister requested we mail it to her.)    Margarito Liner, LCSW 11/28/2019, 1:05 PM

## 2019-11-28 NOTE — Care Management CC44 (Signed)
Condition Code 44 Documentation Completed  Patient Details  Name: Ronald Matthews MRN: [id number] Date of Birth: 09-Dec-1932   Condition Code 44 given:  Yes(Sister requested it be mailed to her.) Patient signature on Condition Code 44 notice:  Yes Documentation of 2 MD's agreement:  Yes Code 44 added to claim:  Yes    Margarito Liner, LCSW 11/28/2019, 1:05 PM

## 2019-11-28 NOTE — TOC Initial Note (Signed)
Transition of Care [facility]) - Initial/Assessment Note    Patient Details  Name: Ronald Matthews MRN: [id number] Date of Birth: Jul 27, 1933  Transition of Care [facility]) CM/SW Contact:    Candie Chroman, LCSW Phone Number: 11/28/2019, 1:30 PM  Clinical Narrative: Patient only oriented to self. CSW called patient's sister, introduced role, and explained that discharge planning would be discussed. Sister was not aware he had moved back to [facility] but is agreeable to return. ALF can take him back today and staff member, Lynelle Smoke will take him to his appt with the oral surgeon tomorrow at 9:00 am. They thought patient was still active with [facility] for home health but [facility] said although they have worked with him in the past, they were not currently active. They can provide PT/OT services at discharge and will start tomorrow. CSW faxed FL2 and discharge summary to ALF for review. Patient will need to return by EMS. No further concerns. CSW encouraged patient's sister to contact CSW as needed. CSW will continue to follow patient for support and facilitate return to ALF today.  Expected Discharge Plan: Assisted Living(with home health PT, OT) Barriers to Discharge: Barriers Resolved   Patient Goals and CMS Choice Patient states their goals for this hospitalization and ongoing recovery are:: Patient not fully oriented.   Choice offered to / list presented to : NA  Expected Discharge Plan and Services Expected Discharge Plan: Assisted Living(with home health PT, OT)     Post Acute Care Choice: [place] arrangements for the past 2 months: [place], [place] Expected Discharge Date: 11/28/19                         HH Arranged: PT, OT HH Agency: [facility] Date [place]: 11/28/19   Representative spoke with at [place]: Pollie Meyer  Prior Living Arrangements/Services Living arrangements for the past 2 months: [place], [place] Lives with:: Facility Resident Patient language and need for interpreter reviewed:: Yes Do you feel safe going back to the place where you live?: Yes      Need for Family Participation in Patient Care: Yes (Comment) Care giver support system in place?: Yes (comment) Current home services: DME Criminal Activity/Legal Involvement Pertinent to Current Situation/Hospitalization: No - Comment as needed  Activities of Daily Living   ADL Screening (condition at time of admission) Patient's cognitive ability adequate to safely complete daily activities?: No Is the patient deaf or have difficulty hearing?: No Does the patient have difficulty seeing, even when wearing glasses/contacts?: No Does the patient have difficulty concentrating, remembering, or making decisions?: Yes Patient able to express need for assistance with ADLs?: Yes Does the patient have difficulty dressing or bathing?: Yes Independently performs ADLs?: No Communication: Independent Dressing (OT): Needs assistance Grooming: Needs assistance Bathing: Needs assistance Toileting: Needs assistance In/Out Bed: Dependent Does the patient have difficulty walking or climbing stairs?: Yes Weakness of Legs: None Weakness of Arms/Hands: Left  Permission Sought/Granted Permission sought to share information with : Facility Sport and exercise psychologist, Family Supports    Share Information with NAME: [name] Loftin  Permission granted to share info w AGENCY: [facility]  Permission granted to share info w Relationship: Sister  Permission granted to share info w Contact Information: [phone]  Emotional Assessment Appearance:: Appears stated age Attitude/Demeanor/Rapport: Unable to Assess Affect (typically observed): Unable to Assess Orientation: : Oriented to Self Alcohol / Substance Use: Not Applicable Psych  Involvement: No (comment)  Admission diagnosis:  Lower urinary tract  infectious disease [N39.0] Altered mental status [R41.82] Acute encephalopathy [G93.40] Dehydration [E86.0] Patient Active Problem List   Diagnosis Date Noted  . Dehydration 11/28/2019  . Altered mental status 11/27/2019  . Jaw pain   . CVA (cerebral vascular accident) (HCC) 04/17/2019  . Ischemic stroke (HCC) 07/15/2018  . Hypertension 07/15/2018  . Dementia (HCC) 07/15/2018  . Coronary artery disease 07/15/2018   PCP:  Galvin Proffer, MD Pharmacy:  No Pharmacies Listed    Social Determinants of Health (SDOH) Interventions    Readmission Risk Interventions No flowsheet data found.

## 2019-11-28 NOTE — Clinical Social Work Note (Signed)
CSW acknowledges SNF consult. According to chart, patient admitted from [facility]. He was at [facility] from 04/19/2019-11/21/2019. CSW will follow for discharge needs.  Charlynn Court, CSW [phone]

## 2019-11-28 NOTE — Progress Notes (Signed)
Patient discharged to [facility]. Report called to Tammy at facility. EMS called for transport.

## 2019-11-29 LAB — URINE CULTURE: Culture: 90000 — AB

## 2019-12-05 NOTE — Progress Notes (Signed)
[facility]  Physical Therapy Certification  Patient Details  Name: Ronald Matthews MRN: [id number] Date of Birth: Oct 20, 1933 Medical Diagnosis: Active Problems:   Altered mental status   Dehydration  Visit Diagnosis: PT Visit Diagnosis: Unsteadiness on feet (R26.81), History of falling (Z91.81), Other abnormalities of gait and mobility (R26.89) PT Problem: Decreased strength, Decreased range of motion, Decreased activity tolerance, Decreased balance, Decreased mobility  Goals: Pt will go Sit to Supine/Side: with modified independence Patient will transfer sit to/from stand: with modified independence Pt will Transfer Bed to Chair/Chair to Bed: with supervision  Duration: Services will be provided through the following date: 12/12/19  Frequency: Min 2X/week  Amount: one treatment session per day unless otherwise indicated.    Certification Start Date: 11/28/2019 Certification End Date: 12/12/19   PT Treatments/Interventions: Balance training, DME instruction, Functional mobility training, Therapeutic activities, Therapeutic exercise, Patient/family education  Emmajane Altamura H. Manson Passey, PT, DPT, NCS 12/05/19, 9:35 PM [phone]

## 2020-05-07 ENCOUNTER — Encounter (HOSPITAL_COMMUNITY): Payer: Self-pay

## 2020-05-07 ENCOUNTER — Emergency Department (HOSPITAL_COMMUNITY): Payer: Medicare Other

## 2020-05-07 ENCOUNTER — Observation Stay (HOSPITAL_COMMUNITY)
Admission: EM | Admit: 2020-05-07 | Discharge: 2020-05-08 | Disposition: A | Discharge status: Home / Self Care | Payer: Medicare Other | Attending: Internal Medicine | Admitting: Internal Medicine

## 2020-05-07 DIAGNOSIS — F0150 Vascular dementia without behavioral disturbance: Secondary | ICD-10-CM | POA: Diagnosis not present

## 2020-05-07 DIAGNOSIS — M6281 Muscle weakness (generalized): Secondary | ICD-10-CM | POA: Diagnosis not present

## 2020-05-07 DIAGNOSIS — F17210 Nicotine dependence, cigarettes, uncomplicated: Secondary | ICD-10-CM | POA: Diagnosis not present

## 2020-05-07 DIAGNOSIS — R55 Syncope and collapse: Secondary | ICD-10-CM | POA: Diagnosis not present

## 2020-05-07 DIAGNOSIS — Z79899 Other long term (current) drug therapy: Secondary | ICD-10-CM | POA: Diagnosis not present

## 2020-05-07 DIAGNOSIS — Z7982 Long term (current) use of aspirin: Secondary | ICD-10-CM | POA: Diagnosis not present

## 2020-05-07 DIAGNOSIS — R4189 Other symptoms and signs involving cognitive functions and awareness: Secondary | ICD-10-CM | POA: Diagnosis not present

## 2020-05-07 DIAGNOSIS — Z20822 Contact with and (suspected) exposure to covid-19: Secondary | ICD-10-CM | POA: Insufficient documentation

## 2020-05-07 DIAGNOSIS — Z8673 Personal history of transient ischemic attack (TIA), and cerebral infarction without residual deficits: Secondary | ICD-10-CM | POA: Diagnosis not present

## 2020-05-07 DIAGNOSIS — I10 Essential (primary) hypertension: Secondary | ICD-10-CM | POA: Diagnosis not present

## 2020-05-07 DIAGNOSIS — R4182 Altered mental status, unspecified: Secondary | ICD-10-CM | POA: Diagnosis present

## 2020-05-07 DIAGNOSIS — N3001 Acute cystitis with hematuria: Secondary | ICD-10-CM | POA: Diagnosis not present

## 2020-05-07 LAB — RAPID URINE DRUG SCREEN, HOSP PERFORMED
Amphetamines: NOT DETECTED
Barbiturates: NOT DETECTED
Benzodiazepines: NOT DETECTED
Cocaine: NOT DETECTED
Opiates: NOT DETECTED
Tetrahydrocannabinol: NOT DETECTED

## 2020-05-07 LAB — DIFFERENTIAL
Abs Immature Granulocytes: 0.01 10*3/uL (ref 0.00–0.07)
Basophils Absolute: 0 10*3/uL (ref 0.0–0.1)
Basophils Relative: 1 %
Eosinophils Absolute: 0.3 10*3/uL (ref 0.0–0.5)
Eosinophils Relative: 5 %
Immature Granulocytes: 0 %
Lymphocytes Relative: 42 %
Lymphs Abs: 2.4 10*3/uL (ref 0.7–4.0)
Monocytes Absolute: 0.5 10*3/uL (ref 0.1–1.0)
Monocytes Relative: 10 %
Neutro Abs: 2.3 10*3/uL (ref 1.7–7.7)
Neutrophils Relative %: 42 %

## 2020-05-07 LAB — COMPREHENSIVE METABOLIC PANEL
ALT: 19 U/L (ref 0–44)
AST: 23 U/L (ref 15–41)
Albumin: 3.7 g/dL (ref 3.5–5.0)
Alkaline Phosphatase: 62 U/L (ref 38–126)
Anion gap: 12 (ref 5–15)
BUN: 26 mg/dL — ABNORMAL HIGH (ref 8–23)
CO2: 22 mmol/L (ref 22–32)
Calcium: 9.1 mg/dL (ref 8.9–10.3)
Chloride: 105 mmol/L (ref 98–111)
Creatinine, Ser: 1.01 mg/dL (ref 0.61–1.24)
GFR calc Af Amer: >60 mL/min (ref >60)
GFR calc non Af Amer: >60 mL/min (ref >60)
Glucose, Bld: 124 mg/dL — ABNORMAL HIGH (ref 70–99)
Potassium: 3.9 mmol/L (ref 3.5–5.1)
Sodium: 139 mmol/L (ref 135–145)
Total Bilirubin: 0.4 mg/dL (ref 0.3–1.2)
Total Protein: 6.7 g/dL (ref 6.5–8.1)

## 2020-05-07 LAB — CBC
HCT: 33.4 % — ABNORMAL LOW (ref 39.0–52.0)
Hemoglobin: 10.2 g/dL — ABNORMAL LOW (ref 13.0–17.0)
MCH: 27.3 pg (ref 26.0–34.0)
MCHC: 30.5 g/dL (ref 30.0–36.0)
MCV: 89.5 fL (ref 80.0–100.0)
Platelets: 241 10*3/uL (ref 150–400)
RBC: 3.73 MIL/uL — ABNORMAL LOW (ref 4.22–5.81)
RDW: 14.6 % (ref 11.5–15.5)
WBC: 5.5 10*3/uL (ref 4.0–10.5)
nRBC: 0 % (ref 0.0–0.2)

## 2020-05-07 LAB — URINALYSIS, ROUTINE W REFLEX MICROSCOPIC
Bilirubin Urine: NEGATIVE
Glucose, UA: NEGATIVE mg/dL
Ketones, ur: NEGATIVE mg/dL
Nitrite: POSITIVE — AB
Protein, ur: 30 mg/dL — AB
Specific Gravity, Urine: 1.033 — ABNORMAL HIGH (ref 1.005–1.030)
WBC, UA: >50 WBC/hpf — ABNORMAL HIGH (ref 0–5)
pH: 6 (ref 5.0–8.0)

## 2020-05-07 LAB — I-STAT CHEM 8, ED
BUN: 26 mg/dL — ABNORMAL HIGH (ref 8–23)
Calcium, Ion: 1.15 mmol/L (ref 1.15–1.40)
Chloride: 104 mmol/L (ref 98–111)
Creatinine, Ser: 1 mg/dL (ref 0.61–1.24)
Glucose, Bld: 121 mg/dL — ABNORMAL HIGH (ref 70–99)
HCT: 33 % — ABNORMAL LOW (ref 39.0–52.0)
Hemoglobin: 11.2 g/dL — ABNORMAL LOW (ref 13.0–17.0)
Potassium: 4 mmol/L (ref 3.5–5.1)
Sodium: 140 mmol/L (ref 135–145)
TCO2: 24 mmol/L (ref 22–32)

## 2020-05-07 LAB — ETHANOL: Alcohol, Ethyl (B): <10 mg/dL (ref <10)

## 2020-05-07 LAB — CBG MONITORING, ED: Glucose-Capillary: 120 mg/dL — ABNORMAL HIGH (ref 70–99)

## 2020-05-07 LAB — APTT: aPTT: 31 seconds (ref 24–36)

## 2020-05-07 LAB — PROTIME-INR
INR: 1.2 (ref 0.8–1.2)
Prothrombin Time: 14.7 seconds (ref 11.4–15.2)

## 2020-05-07 MED ORDER — IOHEXOL 350 MG/ML SOLN
100.0000 mL | Freq: Once | INTRAVENOUS | Status: AC | PRN
  Administered 2020-05-07: 75 mL via INTRAVENOUS

## 2020-05-07 MED ORDER — ACETAMINOPHEN 650 MG RE SUPP: 650.0000 mg | Freq: Four times a day (QID) | RECTAL | Status: DC | PRN

## 2020-05-07 MED ORDER — ACETAMINOPHEN 325 MG PO TABS: 650.0000 mg | ORAL_TABLET | Freq: Four times a day (QID) | ORAL | Status: DC | PRN

## 2020-05-07 MED ORDER — ENOXAPARIN SODIUM 40 MG/0.4ML ~~LOC~~ SOLN
40.0000 mg | SUBCUTANEOUS | Status: DC
  Administered 2020-05-07: 40 mg via SUBCUTANEOUS
  Filled 2020-05-07: qty 0.4

## 2020-05-07 NOTE — ED Triage Notes (Addendum)
Pt bib [facility] from assisted living for left sided weakness and increased AMS from baseline. LKW 1345. Pt has hx of dementia, AOx1 at baseline per EMS.

## 2020-05-07 NOTE — Hospital Course (Addendum)
Admitted 05/07/2020  Allergies: Cephalexin, Ppd [tuberculin purified protein derivative], and Tuberculin tests Pertinent Hx: Dementia, HTN, CAD, CVA (R pontine 2019)  86 y.o. male p/w code stroke  *LOC: TIA vs seizure. Noted at facility ([facility]), eating lunch, found slumped over, drooling, not responding, with left sided weakness. LKW at 1:35. Minimal deficits. CT head showed no acute findings. CTA H+N showed diffuse stenosis, chronic. Has hx of left hemiparesis and contracture. Neurology following, favoring unresponsive episode but possibly not neuro, doing stroke vs seizure work up.   *HTN: Holding home BP meds for above and npo status  Consults: Neurology  Meds: ASA, lipitor VTE ppx: Lovenox IVF: None Diet: NPO pending SLP

## 2020-05-07 NOTE — H&P (Signed)
Date: 05/07/2020               Patient Name:  Ronald Matthews MRN: [id number]  DOB: 05-12-1933 Age / Sex: 86 y.o., male   PCP: Galvin ProfferHague, Ronald P, MD         Medical Service: Internal Medicine Teaching Service         Attending Physician: Dr. Reymundo PollGuilloud, Carolyn, MD    First Contact: Dr. Merrilyn PumaJinwala, Ronald Fabrizio, MD Pager: [phone]  Second Contact: Dr. Hermine MessickKrienke, Marissa, MD Pager: [phone]       After Hours (After 5p/  First Contact Pager: [phone]  weekends / holidays): Second Contact Pager: [phone]   Chief Complaint: Code Stroke  History of Present Illness: Patient has a history of dementia and was unable to give [place] any significant history. The resident care director at the patient's nursing facility [facility](Ronald Matthews) also could not be reached via phone call. Therefore, the following history was obtained via chart review.   Ronald Matthews is an 86 year old male with a history of CVA (07/2018), vascular dementia, CAD, HTN, and HLD who presents to the ED as a Code Stroke.  Due to patient's baseline and current condition, history was obtained from the [facility](Ronald Matthews, [phone]) and EMS.  The patient reportedly was in his usual state of health throughout the morning. [As per the patient, he had eggs and bacon for breakfast without any issues. However, he cannot remember what he had for lunch.] Reportedly, the patient ate all of his lunch, but when the nursing facility staff went to retrieve his tray, they discovered that the patient was drooling, staring off, unresponsive to voice, and possibly weak on his L side.  EMS arrived at the scene and activated Code Stroke. Patient LKW at 1345 on 05/07/20. Upon arrival, patient went to CT with the stroke team, NIHSS of 8 with deficits of disorientation, right facial droop, bilateral leg weakness, expressive aphasia, and dysarthria on exam. CTH and CTA head and neck were performed. Patient deemed not a  candidate for tPA because symptoms were too minor treat per stroke team MD.  As per the resident care director, at baseline, the patient is oriented to person and place, but not to time. He moves around via wheelchair and is able to take care of most of his Activities of Daily Living except needing assistance to get dressed and to use the bathroom. As per the director, patient smokes cigarettes daily.  On my evaluation, patient lying comfortably in bed with one arm held against his body. Patient has a pleasant attitude. He is able to tell us his name and that he is in the hospital. He also is able to tell us that he lives in a nursing home. Reports that he does not know the year or who the president of the [place] is. When asked if he knows why he is in the hospital, he states that it is because of his left shoulder. He reports that his left shoulder is hurting and that "its been going on for years."   Patient is able to give appropriate answers to questions.  ROS as obtained from patient is as follows but please keep in mind that patient does have Dementia: Review of Systems  Constitutional: Negative for chills, fever and malaise/fatigue.  Eyes: Negative for blurred vision and double vision.  Respiratory: Negative for cough and shortness of breath.   Cardiovascular: Negative for chest pain, palpitations, orthopnea and leg  swelling.  Gastrointestinal: Negative for abdominal pain, constipation, diarrhea, nausea and vomiting.  Musculoskeletal: Negative for myalgias.  Neurological: Negative for dizziness, loss of consciousness, weakness and headaches.      Meds: Current Meds  Medication Sig  . acetaminophen (TYLENOL) 325 MG tablet Take 2 tablets (650 mg total) by mouth every 4 (four) hours as needed for mild pain (or temp > 37.5 C (99.5 F)). (Patient taking differently: Take 650 mg by mouth See admin instructions. Take 650 mg by mouth three times a day and an additional 650 mg as needed for fever,  headache, or discomfort)  . alum & mag hydroxide-simeth (MYLANTA) 200-200-20 MG/5ML suspension Take 30 mLs by mouth once as needed for indigestion or heartburn.  . Amino Acids-Protein Hydrolys (FEEDING SUPPLEMENT, PRO-STAT SUGAR FREE 64,) LIQD Take 30 mLs by mouth daily. CHERRY  . aspirin 81 MG chewable tablet Chew 81 mg by mouth in the morning.  Marland Kitchen atorvastatin (LIPITOR) 40 MG tablet Take 1 tablet (40 mg total) by mouth daily at 6 PM. (Patient taking differently: Take 40 mg by mouth daily. )  . baclofen (LIORESAL) 10 MG tablet Take 10 mg by mouth 3 (three) times daily.  Marland Kitchen bismuth subsalicylate (PEPTO-BISMOL) 262 MG/15ML suspension Take 15 mLs by mouth every 2 (two) hours as needed for indigestion or diarrhea or loose stools (max of 6 doses in 24 hours).  . brimonidine (ALPHAGAN) 0.2 % ophthalmic solution Place 2 drops into both eyes 2 (two) times daily.  . Colloidal Oatmeal (AVEENO ECZEMA THERAPY) 1 % CREA Apply 1 application topically See admin instructions. Apply to affected areas 2 times a day  . hydrocortisone cream 1 % Apply 1 application topically 2 (two) times daily.  Marland Kitchen lactose free nutrition (BOOST) LIQD Take 237 mLs by mouth 3 (three) times daily. VANILLA  . loperamide (IMODIUM A-D) 2 MG tablet Take 2 mg by mouth 4 (four) times daily as needed (after each loose stool- up to 4 doses in 12 hours and call MD if this diarrhea persists longer than 12 hours or is accompanied by severe abdominal pain).  Marland Kitchen loperamide (IMODIUM) 2 MG capsule Take 2 mg by mouth daily as needed for diarrhea or loose stools.   . magnesium hydroxide (MILK OF MAGNESIA) 400 MG/5ML suspension Take 30 mLs by mouth once as needed for mild constipation.  . metoprolol succinate (TOPROL-XL) 25 MG 24 hr tablet Take 12.5 mg by mouth daily.   . Multiple Vitamins-Minerals (MULTIVITAMINS THER. W/MINERALS) TABS tablet Take 1 tablet by mouth daily.  . nitroGLYCERIN (NITROSTAT) 0.4 MG SL tablet Place 0.4 mg under the tongue every 5  (five) minutes x 3 doses as needed for chest pain (AND CALL EMS IF NO RESOLUTION).  . Phenylephrine-DM-GG (ROBITUSSIN COUGH/COLD CF PO) Take 10 mLs by mouth every 6 (six) hours as needed (for cough).  . senna-docusate (SENOKOT-S) 8.6-50 MG tablet Take 1 tablet by mouth at bedtime as needed for mild constipation.  . Sodium Phosphates (FLEET ENEMA RE) Place 1 enema rectally once as needed (for constipation not relieved by prune juice and milk of magnesia protocol and call MD if no relief from enema).  . Travoprost, BAK Free, (TRAVATAN) 0.004 % SOLN ophthalmic solution Place 1 drop into both eyes at bedtime.  . white petrolatum (VASELINE) GEL Apply 1 application topically as needed (to affected areas).   . zinc oxide 20 % ointment Apply 1 application topically See admin instructions. Apply to affected areas 2 times a day and to the buttocks  daily as needed for rash     Allergies: Allergies as of 05/07/2020 - Review Complete 05/07/2020  Allergen Reaction Noted  . Cephalexin Other (See Comments) 06/21/2019  . Ppd [tuberculin purified protein derivative] Other (See Comments) 07/15/2018  . Tuberculin tests Other (See Comments) 07/15/2018   Past Medical History:  Diagnosis Date  . Coronary artery disease   . Dementia (HCC)   . Hypertension   . Thrombocytopenia (HCC)     Family History:  Unable to obtain  Social History: As per resident Interior and spatial designer, patient smokes cigarettes daily. Patient lives in a nursing facility.   Physical Exam: Blood pressure (!) 151/74, pulse (!) 50, temperature 98.2 F (36.8 C), temperature source Oral, resp. rate 12, SpO2 98 %. Physical Exam Constitutional:      Comments: Patient is lying comfortably in bed, NAD. Has his left arm adducted and internally rotated.  HENT:     Head: Normocephalic and atraumatic.     Mouth/Throat:     Mouth: Mucous membranes are moist.     Pharynx: Oropharynx is clear. No oropharyngeal exudate or posterior oropharyngeal erythema.    Eyes:     General: No scleral icterus.    Extraocular Movements: Extraocular movements intact.     Conjunctiva/sclera: Conjunctivae normal.     Pupils: Pupils are equal, round, and reactive to light.  Cardiovascular:     Rate and Rhythm: Normal rate and regular rhythm.     Pulses: Normal pulses.     Heart sounds: Normal heart sounds. No murmur heard.  No friction rub. No gallop.   Pulmonary:     Effort: Pulmonary effort is normal. No respiratory distress.     Breath sounds: Normal breath sounds. No stridor. No wheezing or rhonchi.  Abdominal:     General: Bowel sounds are normal. There is no distension.     Palpations: Abdomen is soft. There is no mass.     Tenderness: There is no abdominal tenderness. There is no guarding or rebound.  Musculoskeletal:     Cervical back: Normal range of motion and neck supple. No rigidity or tenderness.  Neurological:     General: No focal deficit present.     Mental Status: Mental status is at baseline.     Cranial Nerves: No cranial nerve deficit.     Comments: Alert and oriented to person and place, but not to time. Sensation is intact bilaterally in the upper extremities and face. Sensation is intact in the R lower extremity Decreased sensation in distal left foot (at toes). Muscle strength is appropriate in the right arm and bilateral lower extremities. Slightly decreased muscle strength in the left arm. 2+ DTRs in bilateral arms and right lower leg. Unable to elicit reflex in left lower leg.    CBC Latest Ref Rng & Units 05/07/2020 05/07/2020 11/27/2019  WBC 4.0 - 10.5 K/uL - 5.5 7.6  Hemoglobin 13.0 - 17.0 g/dL 11.2(L) 10.2(L) 11.9(L)  Hematocrit 39 - 52 % 33.0(L) 33.4(L) 38.8(L)  Platelets 150 - 400 K/uL - 241 228   BMP Latest Ref Rng & Units 05/07/2020 05/07/2020 11/27/2019  Glucose 70 - 99 mg/dL 696(E) 952(W) 99  BUN 8 - 23 mg/dL 41(L) 24(M) 19  Creatinine 0.61 - 1.24 mg/dL 0.10 2.72 5.36  Sodium 135 - 145 mmol/L 140 139 138  Potassium 3.5 -  5.1 mmol/L 4.0 3.9 4.6  Chloride 98 - 111 mmol/L 104 105 106  CO2 22 - 32 mmol/L - 22 21(L)  Calcium 8.9 - 10.3  mg/dL - 9.1 9.0     Urinalysis    Component Value Date/Time   COLORURINE YELLOW 05/07/2020 1708   APPEARANCEUR CLOUDY (A) 05/07/2020 1708   LABSPEC 1.033 (H) 05/07/2020 1708   PHURINE 6.0 05/07/2020 1708   GLUCOSEU NEGATIVE 05/07/2020 1708   HGBUR LARGE (A) 05/07/2020 1708   BILIRUBINUR NEGATIVE 05/07/2020 1708   KETONESUR NEGATIVE 05/07/2020 1708   PROTEINUR 30 (A) 05/07/2020 1708   NITRITE POSITIVE (A) 05/07/2020 1708   LEUKOCYTESUR SMALL (A) 05/07/2020 1708   Drugs of Abuse     Component Value Date/Time   LABOPIA NONE DETECTED 05/07/2020 1708   COCAINSCRNUR NONE DETECTED 05/07/2020 1708   LABBENZ NONE DETECTED 05/07/2020 1708   AMPHETMU NONE DETECTED 05/07/2020 1708   THCU NONE DETECTED 05/07/2020 1708   LABBARB NONE DETECTED 05/07/2020 1708     EKG Interpretation  Date/Time:                  Tuesday May 07 2020 15:49:12 EDT Ventricular Rate:         69 PR Interval:                 306 QRS Duration: 70 QT Interval:                 412 QTC Calculation:        441 R Axis:                         54 Text Interpretation:      Sinus rhythm with 1st degree A-V block Nonspecific ST and T wave abnormality Abnormal ECG Artifact noted. Similar to Jan 2021 tracing No STEMI Confirmed by Alona Bene [phone]) on 05/07/2020 3:57:06 PM   Radiology:   CT Code Stroke CTA Head W/WO contrast  Result Date: 05/07/2020 CLINICAL DATA:  Focal neuro deficit, greater than 6 hours, stroke suspected. Additional provided: Left-sided weakness. EXAM: CT ANGIOGRAPHY HEAD AND NECK TECHNIQUE: Multidetector CT imaging of the head and neck was performed using the standard protocol during bolus administration of intravenous contrast. Multiplanar CT image reconstructions and MIPs were obtained to evaluate the vascular anatomy. Carotid stenosis measurements (when applicable) are obtained  utilizing NASCET criteria, using the distal internal carotid diameter as the denominator. CONTRAST:  75mL OMNIPAQUE IOHEXOL 350 MG/ML SOLN COMPARISON:  Concurrently performed noncontrast head CT 05/07/2020, CT head 05/18/2019, MRA head 05/18/2019 FINDINGS: CTA NECK FINDINGS Aortic arch: Common origin of the innominate and left common carotid arteries atherosclerotic plaque within the visualized aortic arch and proximal major branch vessels of the neck. No hemodynamically significant innominate or proximal subclavian artery stenosis. Right carotid system: CCA and ICA patent within the neck without significant stenosis (50% or greater). Mild calcified plaque within the carotid bifurcation and proximal ICA. Left carotid system: CCA and ICA patent within the neck without significant stenosis (50% or greater). Mild calcified plaque within the carotid bifurcation and proximal ICA. Vertebral arteries: The right vertebral artery is dominant. There is scattered atherosclerotic plaque within the right vertebral artery. Atherosclerotic plaque results in moderate stenosis within the proximal right V2 segment (series 7, image 136). Moderate/severe atherosclerotic narrowing at the origin of the non dominant left vertebral artery. Redemonstrated sites of high-grade stenosis within the left vertebral artery at the level of the skull base Skeleton: No acute bony abnormality or aggressive osseous lesion. Cervical spondylosis with multilevel disc space narrowing, posterior disc osteophytes, uncovertebral and facet hypertrophy. There also prominent multilevel ventral osteophytes.  Degenerative fusion across the facet joints at the C2-C3 level. Other neck: No neck mass or cervical lymphadenopathy. Thyroid unremarkable. Upper chest: No consolidation within the imaged lung apices. Review of the MIP images confirms the above findings CTA HEAD FINDINGS Anterior circulation: The intracranial internal carotid arteries are patent. Calcified  plaque within both vessels noncalcified plaque results in moderate stenosis of the pre cavernous left ICA (series 7, image 253). Otherwise, no more than mild intracranial ICA stenosis. The M1 middle cerebral arteries are patent without significant stenosis. No M2 proximal branch occlusion is identified. There are moderate stenoses within multiple mid to distal M2 right MCA branch vessels. The anterior cerebral arteries are patent without significant proximal stenosis. No intracranial aneurysm is identified. Posterior circulation: The dominant intracranial right vertebral artery is patent. Mild calcified plaque within this vessel without significant stenosis. Multifocal high-grade stenoses within the non dominant left vertebral artery at the level of the skull base. 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 The basilar artery is patent without significant stenosis. The posterior cerebral arteries are patent bilaterally without significant proximal stenosis. There is a moderate stenosis within the P3 left PCA. Posterior communicating arteries are hypoplastic or absent bilaterally. Venous sinuses: Within limitations of contrast timing, no convincing thrombus. Anatomic variants: As described Review of the MIP images confirms the above findings No emergent large vessel occlusion. These results were communicated to Dr. Amada Jupiter at 4:09 pmon 7/6/2021by text page via the [facility] messaging system. IMPRESSION: CTA neck: 1. The bilateral common and internal carotid arteries are patent within the neck without significant stenosis. Mild atherosclerotic plaque within the carotid bifurcations and proximal ICAs. 2. Moderate stenosis within the proximal V2 segment of the dominant right vertebral artery. 3. Moderate/severe atherosclerotic narrowing at the origin of the non dominant left vertebral artery. Redemonstrated multifocal high-grade stenoses within the left vertebral artery at the level of the  skull base.      CTA head: 1. No intracranial large vessel occlusion. 2. Intracranial atherosclerotic disease with multifocal stenoses, most notably as follows. 3. Unchanged multifocal high-grade stenoses within the non dominant left vertebral artery at the level of the skull base. 4. Unchanged moderate stenosis of the pre cavernous left internal carotid artery. 5. Moderate stenoses within multiple mid to distal M2 right MCA branch vessels. 6. Moderate focal stenosis within the P3 left posterior cerebral artery. Electronically Signed   By: Jackey Loge DO   On: 05/07/2020 16:10   CT Code Stroke CTA Neck W/WO contrast  Result Date: 05/07/2020 CLINICAL DATA:  Focal neuro deficit, greater than 6 hours, stroke suspected. Additional provided: Left-sided weakness. EXAM: CT ANGIOGRAPHY HEAD AND NECK TECHNIQUE: Multidetector CT imaging of the head and neck was performed using the standard protocol during bolus administration of intravenous contrast. Multiplanar CT image reconstructions and MIPs were obtained to evaluate the vascular anatomy. Carotid stenosis measurements (when applicable) are obtained utilizing NASCET criteria, using the distal internal carotid diameter as the denominator. CONTRAST:  75mL OMNIPAQUE IOHEXOL 350 MG/ML SOLN COMPARISON:  Concurrently performed noncontrast head CT 05/07/2020, CT head 05/18/2019, MRA head 05/18/2019 FINDINGS: CTA NECK FINDINGS Aortic arch: Common origin of the innominate and left common carotid arteries atherosclerotic plaque within the visualized aortic arch and proximal major branch vessels of the neck. No hemodynamically significant innominate or proximal subclavian artery stenosis. Right carotid system: CCA and ICA patent within the neck without significant stenosis (50% or greater). Mild calcified plaque within the carotid bifurcation and proximal ICA. Left carotid system:  CCA and ICA patent within the neck without significant stenosis (50% or greater). Mild calcified  plaque within the carotid bifurcation and proximal ICA. Vertebral arteries: The right vertebral artery is dominant. There is scattered atherosclerotic plaque within the right vertebral artery. Atherosclerotic plaque results in moderate stenosis within the proximal right V2 segment (series 7, image 136). Moderate/severe atherosclerotic narrowing at the origin of the non dominant left vertebral artery. Redemonstrated sites of high-grade stenosis within the left vertebral artery at the level of the skull base Skeleton: No acute bony abnormality or aggressive osseous lesion. Cervical spondylosis with multilevel disc space narrowing, posterior disc osteophytes, uncovertebral and facet hypertrophy. There also prominent multilevel ventral osteophytes. Degenerative fusion across the facet joints at the C2-C3 level. Other neck: No neck mass or cervical lymphadenopathy. Thyroid unremarkable. Upper chest: No consolidation within the imaged lung apices. Review of the MIP images confirms the above findings CTA HEAD FINDINGS Anterior circulation: The intracranial internal carotid arteries are patent. Calcified plaque within both vessels noncalcified plaque results in moderate stenosis of the pre cavernous left ICA (series 7, image 253). Otherwise, no more than mild intracranial ICA stenosis. The M1 middle cerebral arteries are patent without significant stenosis. No M2 proximal branch occlusion is identified. There are moderate stenoses within multiple mid to distal M2 right MCA branch vessels. The anterior cerebral arteries are patent without significant proximal stenosis. No intracranial aneurysm is identified. Posterior circulation: The dominant intracranial right vertebral artery is patent. Mild calcified plaque within this vessel without significant stenosis. Multifocal high-grade stenoses within the non dominant left vertebral artery at the level of the skull base. Suspected short segment fenestration within the proximal  basilar artery. The basilar artery is patent without significant stenosis. The posterior cerebral arteries are patent bilaterally without significant proximal stenosis. There is a moderate stenosis within the P3 left PCA. Posterior communicating arteries are hypoplastic or absent bilaterally. Venous sinuses: Within limitations of contrast timing, no convincing thrombus. Anatomic variants: As described Review of the MIP images confirms the above findings No emergent large vessel occlusion. These results were communicated to Dr. Amada Jupiter at 4:09 pmon 7/6/2021by text page via the [facility] messaging system. IMPRESSION: CTA neck: 1. The bilateral common and internal carotid arteries are patent within the neck without significant stenosis. Mild atherosclerotic plaque within the carotid bifurcations and proximal ICAs. 2. Moderate stenosis within the proximal V2 segment of the dominant right vertebral artery. 3. Moderate/severe atherosclerotic narrowing at the origin of the non dominant left vertebral artery. Redemonstrated multifocal high-grade stenoses within the left vertebral artery at the level of the skull base.      CTA head: 1. No intracranial large vessel occlusion. 2. Intracranial atherosclerotic disease with multifocal stenoses, most notably as follows. 3. Unchanged multifocal high-grade stenoses within the non dominant left vertebral artery at the level of the skull base. 4. Unchanged moderate stenosis of the pre cavernous left internal carotid artery. 5. Moderate stenoses within multiple mid to distal M2 right MCA branch vessels. 6. Moderate focal stenosis within the P3 left posterior cerebral artery. Electronically Signed   By: Jackey Loge DO   On: 05/07/2020 16:10   CT HEAD CODE STROKE WO CONTRAST  Result Date: 05/07/2020 CLINICAL DATA:  Code stroke.  Ataxia, stroke suspected. EXAM: CT HEAD WITHOUT CONTRAST TECHNIQUE: Contiguous axial images were obtained from the base of the skull through the vertex  without intravenous contrast. COMPARISON:  Noncontrast head CT 11/27/2019, CT angiogram head/neck 04/18/2019, brain MRI 04/18/2019. FINDINGS: Brain: Stable mild generalized  cerebral atrophy with advanced cerebellar atrophy. Stable advanced patchy and confluent hypoattenuation within the cerebral white matter which is nonspecific, but consistent with chronic small vessel ischemic disease. Known chronic infarct within the paramedian right pons better appreciated on prior MRI. There is no acute intracranial hemorrhage. No acute demarcated cortical infarct is identified. No extra-axial fluid collection. No evidence of intracranial mass. No midline shift. Vascular: No hyperdense vessel.  Atherosclerotic calcifications Skull: Normal. Negative for fracture or focal lesion. Sinuses/Orbits: Visualized orbits show no acute finding. No significant paranasal sinus disease or mastoid effusion at the imaged levels. ASPECTS ([place] Stroke Program Early CT Score) - Ganglionic level infarction (caudate, lentiform nuclei, internal capsule, insula, M1-M3 cortex): 7 - Supraganglionic infarction (M4-M6 cortex): 3 Total score (0-10 with 10 being normal): 10 These results were communicated to Dr. Amada Jupiter At 3:41 pmon 7/6/2021by text page via the [facility] messaging system. IMPRESSION: 1. No CT evidence of acute intracranial abnormality. ASPECTS is 10. 2. Stable parenchymal atrophy with advanced chronic small vessel ischemic disease. 3. Known chronic infarct within the paramedian right pons, better appreciated on prior MRI 04/18/2019. Electronically Signed   By: Jackey Loge DO   On: 05/07/2020 15:41    Assessment & Plan by Problem: Active Problems:   Syncope  Mr. Loescher is an 86 year old male with history of CVA (07/2018), vascular dementia, CAD, HTN, HLD presenting to ED likely 2/2 syncope and found to have hematuria and asymptomatic bacteriuria.  1. Syncope/transient LOC - Story is unclear at this time; will reach out to  nursing facility tomorrow for further information. - Neurology on board - As per neuro, favoring unresponsive episode, but still doing stroke vs seizure work up - Possible recrudescence of prior stroke symptoms - CT Head w/o contrast --> showed no acute intracranial abnormality - CTA Head --> showed no intracranial large vessel occlusion. Does show intracranial atherosclerotic dx w/multifocal stenoses -CTA Neck --> showed moderate stenosis in proximal segment of dominant R vertebral artery. Also shows moderate/severe atherosclerotic narrowing at origin of non-dominant left vertebral artery - EKG showed 1st degree AV block but no acute ST changes - No clear signs of infection or electrolyte abnormalities - Normoglycemic and not on any diabetic medications so hypoglycemia not likely - UDS negative - Patient stays in nursing facility so likely compliant with medications - Follow neuro recs - On seizure precautions - f/u EEG, lipid panel - Monitor tele  2. CAD, HTN, HLD - Awaiting speech and swallow evaluation before starting home meds  3. Hematuria - Urinalysis shows large Hgb in urine dipstick and RBC/HPF 21-50 - Hgb 10.2 - Cr 1.00, BUN 26 --> renal function appears to be normal - 07/11/18 urinalysis --> showed small Hgb in urine dipstick with 0-5 RBC/HPF - 11/27/19 urinalysis --> showed moderate Hgb in urine dipstick with 0-5 RBC/HPF - Hgb in urine dipstick seems to be progressively increasing when compared to prior urinalyses. Sudden appearance of RBC/HPF 21-50 - Will obtain urine cultures to definitively rule out UTI - If urine culture is negative, will consider further imaging and urology f/u  4. Asymptomatic Bacteriuria - Urinalysis shows positive nitrites, small leukocytes, many bacteria on microscopy, >50 WBCs on microscopy - Patient not complaining of any signs of infection. However, of note, patient is demented. - D/t presence of hematuria, will obtain urine cultures to  definitively rule out UTI - Will reassess in the morning  5. Dementia - Appears to be at baseline   Dispo: Admit patient to Observation with  expected length of stay less than 2 midnights.  Signed: Merrilyn Puma, MD 05/07/2020, 7:13 PM  Pager: [phone] After 5pm on weekdays and 1pm on weekends: On Call pager: [phone]

## 2020-05-07 NOTE — Code Documentation (Signed)
Stroke Response Nurse Documentation Code Documentation  Mirza Fessel is a 86 y.o. male arriving to [facility] ED via [facility] on 05/07/2020 with past medical hx of HTN, CAD, TIA, CVA (07/2018), Dementia, & Thrombocytopenia. Code stroke was activated by EMS. Patient from ALF where he was LKW at 1345 when staff brought lunch to room, found with L weakness when they went to retrieve his tray. On aspirin 81 mg daily. Stroke team at the bedside on patient arrival. Labs drawn and patient cleared for CT by Dr. Rubin Payor. Patient to CT with team. NIHSS 8, see documentation for details and code stroke times. Patient with disoriented, right facial droop, bilateral leg weakness, Expressive aphasia  and dysarthria  on exam. The following imaging was completed:  CT head, CTA head and neck. Patient is not a candidate for tPA due to symptoms too minor to treat per MD. Care/Plan: q77m mNIHSS/q48m VS while inside treatment window, until 1815. Bedside handoff with ED RN Jeannett Senior.    Sinda Du Diavian Furgason  Stroke Response RN

## 2020-05-07 NOTE — ED Provider Notes (Signed)
[facility] EMERGENCY DEPARTMENT Provider Note   CSN: [id number] Arrival date & time: 05/07/20  1509  An emergency department physician performed an initial assessment on this suspected stroke patient at 1509.  History Chief Complaint  Patient presents with  . Code Stroke    Ronald Matthews is a 86 y.o. man with history of CVA (07/2018), vascular dementia, CAD, HTN, HLD who presents to the ED as Code Stroke.  History obtained from resident care director at patient's nursing facility [facility], [phone]) and EMS due to patient's baseline and current condition.  The patient reportedly was in his usual state of health through the morning, ate all of his lunch, but when staff went to retrieve his tray, the patient was found drooling, staring off, unresponsive to voice, and possibly weak on his L side.   EMS arrived and activated code stroke. LKW at 1345. Patient went to CT with the stroke team, NIHSS of 8 with deficits of disorientation, right facial droop, bilateral leg weakness, expressive aphasia, and dysarthria on exam. CTH and CTA head and neck were performed. Patient deemed not a candidate for tPA due to symptoms being too minor to treat per stroke team MD.  On my evaluation, patient sitting upright in bed, pleasant, smell suggestive incontinent of urine. States, "I'm doing alright" but is not oriented to the situation though is oriented to self and location. Denies head pain or HA, CP, SOB, abdominal pain. Endorses left shoulder and elbow discomfort.  Per his resident care director, at baseline the patient is oriented to person, place, and not time. He gets around via wheelchair and is able to take care of most of his ADLs except needs assistance dressing and using the bathroom. Smokes cigarettes daily.   Level 5 caveat dementia.  Past Medical History:  Diagnosis Date  . Coronary artery disease   . Dementia (HCC)   . Hypertension   . Thrombocytopenia  [facility])     Patient Active Problem List   Diagnosis Date Noted  . Dehydration 11/28/2019  . Altered mental status 11/27/2019  . Jaw pain   . CVA (cerebral vascular accident) (HCC) 04/17/2019  . Ischemic stroke (HCC) 07/15/2018  . Hypertension 07/15/2018  . Dementia (HCC) 07/15/2018  . Coronary artery disease 07/15/2018    History reviewed. No pertinent surgical history.     No family history on file.  Social History   Tobacco Use  . Smoking status: Current Every Day Smoker  . Smokeless tobacco: Never Used  Substance Use Topics  . Alcohol use: No  . Drug use: No    Home Medications Prior to Admission medications   Medication Sig Start Date End Date Taking? Authorizing Provider  acetaminophen (TYLENOL) 325 MG tablet Take 2 tablets (650 mg total) by mouth every 4 (four) hours as needed for mild pain (or temp > 37.5 C (99.5 F)). Patient taking differently: Take 650 mg by mouth 3 (three) times daily.  07/18/18  Yes Elgergawy, Leana Roe, MD  aspirin 81 MG chewable tablet Chew 81 mg by mouth in the morning.   Yes [provider]  Amino Acids-Protein Hydrolys (FEEDING SUPPLEMENT, PRO-STAT SUGAR FREE 64,) LIQD Take 30 mLs by mouth daily.    [provider]  aspirin EC 81 MG EC tablet Take 1 tablet (81 mg total) by mouth daily. Patient not taking: Reported on 05/07/2020 07/19/18   Elgergawy, Leana Roe, MD  atorvastatin (LIPITOR) 40 MG tablet Take 1 tablet (40 mg total) by mouth  daily at 6 PM. Patient taking differently: Take 40 mg by mouth daily.  07/18/18   Elgergawy, Leana Roe, MD  baclofen (LIORESAL) 10 MG tablet Take 10 mg by mouth 3 (three) times daily.    [provider]  brimonidine (ALPHAGAN) 0.2 % ophthalmic solution Place 2 drops into both eyes 2 (two) times daily.    [provider]  hydrocortisone cream 1 % Apply 1 application topically 2 (two) times daily.    [provider]  loperamide (IMODIUM) 2 MG capsule Take 2 mg by mouth as  needed for diarrhea or loose stools (max 4 doses in 12 hours).    [provider]  metoprolol succinate (TOPROL-XL) 25 MG 24 hr tablet Take 12.5 mg by mouth daily.     [provider]  Multiple Vitamin (THEREMS) TABS Take 1 tablet by mouth daily.    [provider]  nitroGLYCERIN (NITROSTAT) 0.4 MG SL tablet Place 0.4 mg under the tongue every 5 (five) minutes x 3 doses as needed for chest pain (AND CALL EMS IF NO RESOLUTION).    [provider]  senna-docusate (SENOKOT-S) 8.6-50 MG tablet Take 1 tablet by mouth at bedtime as needed for mild constipation. 04/19/19   Gouru, Deanna Artis, MD  travoprost, benzalkonium, (TRAVATAN) 0.004 % ophthalmic solution Place 1 drop into both eyes at bedtime.    [provider]  white petrolatum (VASELINE) GEL Apply 1 application topically as needed (for dry areas on body).    [provider]    Allergies    Cephalexin, Ppd [tuberculin purified protein derivative], and Tuberculin tests  Review of Systems   Review of Systems  Unable to perform ROS: Dementia    Physical Exam Updated Vital Signs BP (!) 149/74   Pulse 64   Temp 98.2 F (36.8 C) (Oral)   Resp 16   SpO2 95%   Physical Exam Constitutional:      Comments: Tired- though comfortable-appearing. Very thin.  HENT:     Head: Normocephalic and atraumatic.     Mouth/Throat:     Mouth: Mucous membranes are moist.  Eyes:     Extraocular Movements: Extraocular movements intact.     Conjunctiva/sclera: Conjunctivae normal.     Pupils: Pupils are equal, round, and reactive to light.  Cardiovascular:     Rate and Rhythm: Normal rate and regular rhythm.     Pulses: Normal pulses.     Heart sounds: Normal heart sounds.  Pulmonary:     Effort: Pulmonary effort is normal.     Breath sounds: Normal breath sounds.  Abdominal:     General: Abdomen is flat. Bowel sounds are normal.     Palpations: Abdomen is soft.  Skin:    General: Skin is warm and  dry.  Neurological:     Mental Status: He is alert.     Cranial Nerves: Cranial nerves are intact. No dysarthria or facial asymmetry.     Sensory: Sensation is intact.     Motor: Abnormal muscle tone present. No weakness.     Coordination: Finger-Nose-Finger Test normal.     Comments: Increased tone in the L arm, elbow, and hand. L finger-to-nose and pronator drift limited by pain vs tone.     ED Results / Procedures / Treatments   Labs (all labs ordered are listed, but only abnormal results are displayed) Labs Reviewed  CBC - Abnormal; Notable for the following components:      Result Value   RBC 3.73 (*)  Hemoglobin 10.2 (*)    HCT 33.4 (*)    All other components within normal limits  COMPREHENSIVE METABOLIC PANEL - Abnormal; Notable for the following components:   Glucose, Bld 124 (*)    BUN 26 (*)    All other components within normal limits  I-STAT CHEM 8, ED - Abnormal; Notable for the following components:   BUN 26 (*)    Glucose, Bld 121 (*)    Hemoglobin 11.2 (*)    HCT 33.0 (*)    All other components within normal limits  CBG MONITORING, ED - Abnormal; Notable for the following components:   Glucose-Capillary 120 (*)    All other components within normal limits  ETHANOL  PROTIME-INR  APTT  DIFFERENTIAL  RAPID URINE DRUG SCREEN, HOSP PERFORMED  URINALYSIS, ROUTINE W REFLEX MICROSCOPIC    EKG EKG Interpretation  Date/Time:  Tuesday May 07 2020 15:49:12 EDT Ventricular Rate:  69 PR Interval:  306 QRS Duration: 70 QT Interval:  412 QTC Calculation: 441 R Axis:   54 Text Interpretation: Sinus rhythm with 1st degree A-V block Nonspecific ST and T wave abnormality Abnormal ECG Artifact noted. Similar to Jan 2021 tracing No STEMI Confirmed by Alona Bene [phone]) on 05/07/2020 3:57:06 PM   Radiology CT Code Stroke CTA Head W/WO contrast  Result Date: 05/07/2020 CLINICAL DATA:  Focal neuro deficit, greater than 6 hours, stroke suspected. Additional  provided: Left-sided weakness. EXAM: CT ANGIOGRAPHY HEAD AND NECK TECHNIQUE: Multidetector CT imaging of the head and neck was performed using the standard protocol during bolus administration of intravenous contrast. Multiplanar CT image reconstructions and MIPs were obtained to evaluate the vascular anatomy. Carotid stenosis measurements (when applicable) are obtained utilizing NASCET criteria, using the distal internal carotid diameter as the denominator. CONTRAST:  75mL OMNIPAQUE IOHEXOL 350 MG/ML SOLN COMPARISON:  Concurrently performed noncontrast head CT 05/07/2020, CT head 05/18/2019, MRA head 05/18/2019 FINDINGS: CTA NECK FINDINGS Aortic arch: Common origin of the innominate and left common carotid arteries atherosclerotic plaque within the visualized aortic arch and proximal major branch vessels of the neck. No hemodynamically significant innominate or proximal subclavian artery stenosis. Right carotid system: CCA and ICA patent within the neck without significant stenosis (50% or greater). Mild calcified plaque within the carotid bifurcation and proximal ICA. Left carotid system: CCA and ICA patent within the neck without significant stenosis (50% or greater). Mild calcified plaque within the carotid bifurcation and proximal ICA. Vertebral arteries: The right vertebral artery is dominant. There is scattered atherosclerotic plaque within the right vertebral artery. Atherosclerotic plaque results in moderate stenosis within the proximal right V2 segment (series 7, image 136). Moderate/severe atherosclerotic narrowing at the origin of the non dominant left vertebral artery. Redemonstrated sites of high-grade stenosis within the left vertebral artery at the level of the skull base Skeleton: No acute bony abnormality or aggressive osseous lesion. Cervical spondylosis with multilevel disc space narrowing, posterior disc osteophytes, uncovertebral and facet hypertrophy. There also prominent multilevel ventral  osteophytes. Degenerative fusion across the facet joints at the C2-C3 level. Other neck: No neck mass or cervical lymphadenopathy. Thyroid unremarkable. Upper chest: No consolidation within the imaged lung apices. Review of the MIP images confirms the above findings CTA HEAD FINDINGS Anterior circulation: The intracranial internal carotid arteries are patent. Calcified plaque within both vessels noncalcified plaque results in moderate stenosis of the pre cavernous left ICA (series 7, image 253). Otherwise, no more than mild intracranial ICA stenosis. The M1 middle cerebral arteries are patent without significant  stenosis. No M2 proximal branch occlusion is identified. There are moderate stenoses within multiple mid to distal M2 right MCA branch vessels. The anterior cerebral arteries are patent without significant proximal stenosis. No intracranial aneurysm is identified. Posterior circulation: The dominant intracranial right vertebral artery is patent. Mild calcified plaque within this vessel without significant stenosis. Multifocal high-grade stenoses within the non dominant left vertebral artery at the level of the skull base. 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 The basilar artery is patent without significant stenosis. The posterior cerebral arteries are patent bilaterally without significant proximal stenosis. There is a moderate stenosis within the P3 left PCA. Posterior communicating arteries are hypoplastic or absent bilaterally. Venous sinuses: Within limitations of contrast timing, no convincing thrombus. Anatomic variants: As described Review of the MIP images confirms the above findings No emergent large vessel occlusion. These results were communicated to Dr. Amada JupiterKirkpatrick at 4:09 pmon 7/6/2021by text page via the [facility] messaging system. IMPRESSION: CTA neck: 1. The bilateral common and internal carotid arteries are patent within the neck without significant stenosis.  Mild atherosclerotic plaque within the carotid bifurcations and proximal ICAs. 2. Moderate stenosis within the proximal V2 segment of the dominant right vertebral artery. 3. Moderate/severe atherosclerotic narrowing at the origin of the non dominant left vertebral artery. Redemonstrated multifocal high-grade stenoses within the left vertebral artery at the level of the skull base. CTA head: 1. No intracranial large vessel occlusion. 2. Intracranial atherosclerotic disease with multifocal stenoses, most notably as follows. 3. Unchanged multifocal high-grade stenoses within the non dominant left vertebral artery at the level of the skull base. 4. Unchanged moderate stenosis of the pre cavernous left internal carotid artery. 5. Moderate stenoses within multiple mid to distal M2 right MCA branch vessels. 6. Moderate focal stenosis within the P3 left posterior cerebral artery. Electronically Signed   By: Jackey LogeKyle  Golden DO   On: 05/07/2020 16:10   CT Code Stroke CTA Neck W/WO contrast  Result Date: 05/07/2020 CLINICAL DATA:  Focal neuro deficit, greater than 6 hours, stroke suspected. Additional provided: Left-sided weakness. EXAM: CT ANGIOGRAPHY HEAD AND NECK TECHNIQUE: Multidetector CT imaging of the head and neck was performed using the standard protocol during bolus administration of intravenous contrast. Multiplanar CT image reconstructions and MIPs were obtained to evaluate the vascular anatomy. Carotid stenosis measurements (when applicable) are obtained utilizing NASCET criteria, using the distal internal carotid diameter as the denominator. CONTRAST:  75mL OMNIPAQUE IOHEXOL 350 MG/ML SOLN COMPARISON:  Concurrently performed noncontrast head CT 05/07/2020, CT head 05/18/2019, MRA head 05/18/2019 FINDINGS: CTA NECK FINDINGS Aortic arch: Common origin of the innominate and left common carotid arteries atherosclerotic plaque within the visualized aortic arch and proximal major branch vessels of the neck. No  hemodynamically significant innominate or proximal subclavian artery stenosis. Right carotid system: CCA and ICA patent within the neck without significant stenosis (50% or greater). Mild calcified plaque within the carotid bifurcation and proximal ICA. Left carotid system: CCA and ICA patent within the neck without significant stenosis (50% or greater). Mild calcified plaque within the carotid bifurcation and proximal ICA. Vertebral arteries: The right vertebral artery is dominant. There is scattered atherosclerotic plaque within the right vertebral artery. Atherosclerotic plaque results in moderate stenosis within the proximal right V2 segment (series 7, image 136). Moderate/severe atherosclerotic narrowing at the origin of the non dominant left vertebral artery. Redemonstrated sites of high-grade stenosis within the left vertebral artery at the level of the skull base Skeleton: No acute bony abnormality or  aggressive osseous lesion. Cervical spondylosis with multilevel disc space narrowing, posterior disc osteophytes, uncovertebral and facet hypertrophy. There also prominent multilevel ventral osteophytes. Degenerative fusion across the facet joints at the C2-C3 level. Other neck: No neck mass or cervical lymphadenopathy. Thyroid unremarkable. Upper chest: No consolidation within the imaged lung apices. Review of the MIP images confirms the above findings CTA HEAD FINDINGS Anterior circulation: The intracranial internal carotid arteries are patent. Calcified plaque within both vessels noncalcified plaque results in moderate stenosis of the pre cavernous left ICA (series 7, image 253). Otherwise, no more than mild intracranial ICA stenosis. The M1 middle cerebral arteries are patent without significant stenosis. No M2 proximal branch occlusion is identified. There are moderate stenoses within multiple mid to distal M2 right MCA branch vessels. The anterior cerebral arteries are patent without significant proximal  stenosis. No intracranial aneurysm is identified. Posterior circulation: The dominant intracranial right vertebral artery is patent. Mild calcified plaque within this vessel without significant stenosis. Multifocal high-grade stenoses within the non dominant left vertebral artery at the level of the skull base. 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 The basilar artery is patent without significant stenosis. The posterior cerebral arteries are patent bilaterally without significant proximal stenosis. There is a moderate stenosis within the P3 left PCA. Posterior communicating arteries are hypoplastic or absent bilaterally. Venous sinuses: Within limitations of contrast timing, no convincing thrombus. Anatomic variants: As described Review of the MIP images confirms the above findings No emergent large vessel occlusion. These results were communicated to Dr. Amada Jupiter at 4:09 pmon 7/6/2021by text page via the [facility] messaging system. IMPRESSION: CTA neck: 1. The bilateral common and internal carotid arteries are patent within the neck without significant stenosis. Mild atherosclerotic plaque within the carotid bifurcations and proximal ICAs. 2. Moderate stenosis within the proximal V2 segment of the dominant right vertebral artery. 3. Moderate/severe atherosclerotic narrowing at the origin of the non dominant left vertebral artery. Redemonstrated multifocal high-grade stenoses within the left vertebral artery at the level of the skull base. CTA head: 1. No intracranial large vessel occlusion. 2. Intracranial atherosclerotic disease with multifocal stenoses, most notably as follows. 3. Unchanged multifocal high-grade stenoses within the non dominant left vertebral artery at the level of the skull base. 4. Unchanged moderate stenosis of the pre cavernous left internal carotid artery. 5. Moderate stenoses within multiple mid to distal M2 right MCA branch vessels. 6. Moderate focal  stenosis within the P3 left posterior cerebral artery. Electronically Signed   By: Jackey Loge DO   On: 05/07/2020 16:10   CT HEAD CODE STROKE WO CONTRAST  Result Date: 05/07/2020 CLINICAL DATA:  Code stroke.  Ataxia, stroke suspected. EXAM: CT HEAD WITHOUT CONTRAST TECHNIQUE: Contiguous axial images were obtained from the base of the skull through the vertex without intravenous contrast. COMPARISON:  Noncontrast head CT 11/27/2019, CT angiogram head/neck 04/18/2019, brain MRI 04/18/2019. FINDINGS: Brain: Stable mild generalized cerebral atrophy with advanced cerebellar atrophy. Stable advanced patchy and confluent hypoattenuation within the cerebral white matter which is nonspecific, but consistent with chronic small vessel ischemic disease. Known chronic infarct within the paramedian right pons better appreciated on prior MRI. There is no acute intracranial hemorrhage. No acute demarcated cortical infarct is identified. No extra-axial fluid collection. No evidence of intracranial mass. No midline shift. Vascular: No hyperdense vessel.  Atherosclerotic calcifications Skull: Normal. Negative for fracture or focal lesion. Sinuses/Orbits: Visualized orbits show no acute finding. No significant paranasal sinus disease or mastoid effusion at the imaged  levels. ASPECTS ([place]berta Stroke Program Early CT Score) - Ganglionic level infarction (caudate, lentiform nuclei, internal capsule, insula, M1-M3 cortex): 7 - Supraganglionic infarction (M4-M6 cortex): 3 Total score (0-10 with 10 being normal): 10 These results were communicated to Dr. Amada Jupiter At 3:41 pmon 7/6/2021by text page via the [facility] messaging system. IMPRESSION: 1. No CT evidence of acute intracranial abnormality. ASPECTS is 10. 2. Stable parenchymal atrophy with advanced chronic small vessel ischemic disease. 3. Known chronic infarct within the paramedian right pons, better appreciated on prior MRI 04/18/2019. Electronically Signed   By: Jackey Loge DO    On: 05/07/2020 15:41    Procedures Procedures (including critical care time)  Medications Ordered in ED Medications  iohexol (OMNIPAQUE) 350 MG/ML injection 100 mL (75 mLs Intravenous Contrast Given 05/07/20 1540)    ED Course  I have reviewed the triage vital signs and the nursing notes.  Pertinent labs & imaging results that were available during my care of the patient were reviewed by me and considered in my medical decision making (see chart for details).    MDM Rules/Calculators/A&P                          Walker Sitar is a 86 y.o. man with history of CVA (07/2018), vascular dementia, CAD, HTN, HLD who presents to the ED as Code Stroke.  Patient arrived within tPA window and evaluated by the stroke team, NIHSS 8 (disorientation, right facial droop, bilateral leg weakness, expressive aphasia, and dysarthria). CTH negative for intracranial hemorrhage. CTH and CTA without significant changes from baseline. Patient determined to not be a tPA candidate due to symptoms not being severe enough. Upon reevaluation, patient without obvious facial droop, weakness, or aphasia. L upper extremity exam possibly pain limited vs secondary to increased tone.  Given improvement in symptoms, suspect TIA vs syncope vs near-syncope vs episode of unresponsiveness. Seizure less likely given history. CMP unremarkable. CBC with Hgb of 10.2 (near baseline). PT/PTT/INR wnl. Per neurology, patient to be admitted for observation and further work-up, including MRI, EEG, possible echo.  5:10 PM Spoke with internal medicine teaching service regarding admission.  Final Clinical Impression(s) / ED Diagnoses Final diagnoses:  Episode of unresponsiveness    Rx / DC Orders ED Discharge Orders    None       Alphonzo Severance, MD 05/07/20 1710    Maia Plan, MD 05/10/20 1349

## 2020-05-08 ENCOUNTER — Encounter (HOSPITAL_COMMUNITY): Payer: Self-pay | Admitting: Internal Medicine

## 2020-05-08 ENCOUNTER — Observation Stay (HOSPITAL_COMMUNITY): Payer: Medicare Other

## 2020-05-08 DIAGNOSIS — R4182 Altered mental status, unspecified: Secondary | ICD-10-CM

## 2020-05-08 DIAGNOSIS — R55 Syncope and collapse: Secondary | ICD-10-CM | POA: Diagnosis not present

## 2020-05-08 DIAGNOSIS — R4189 Other symptoms and signs involving cognitive functions and awareness: Secondary | ICD-10-CM | POA: Insufficient documentation

## 2020-05-08 DIAGNOSIS — N3001 Acute cystitis with hematuria: Secondary | ICD-10-CM | POA: Insufficient documentation

## 2020-05-08 LAB — BASIC METABOLIC PANEL
Anion gap: 11 (ref 5–15)
BUN: 21 mg/dL (ref 8–23)
CO2: 23 mmol/L (ref 22–32)
Calcium: 9 mg/dL (ref 8.9–10.3)
Chloride: 106 mmol/L (ref 98–111)
Creatinine, Ser: 0.87 mg/dL (ref 0.61–1.24)
GFR calc Af Amer: >60 mL/min (ref >60)
GFR calc non Af Amer: >60 mL/min (ref >60)
Glucose, Bld: 91 mg/dL (ref 70–99)
Potassium: 4.3 mmol/L (ref 3.5–5.1)
Sodium: 140 mmol/L (ref 135–145)

## 2020-05-08 LAB — CBC
HCT: 31.3 % — ABNORMAL LOW (ref 39.0–52.0)
Hemoglobin: 10.2 g/dL — ABNORMAL LOW (ref 13.0–17.0)
MCH: 27.9 pg (ref 26.0–34.0)
MCHC: 32.6 g/dL (ref 30.0–36.0)
MCV: 85.8 fL (ref 80.0–100.0)
Platelets: 245 10*3/uL (ref 150–400)
RBC: 3.65 MIL/uL — ABNORMAL LOW (ref 4.22–5.81)
RDW: 14.6 % (ref 11.5–15.5)
WBC: 5.8 10*3/uL (ref 4.0–10.5)
nRBC: 0 % (ref 0.0–0.2)

## 2020-05-08 LAB — LIPID PANEL
Cholesterol: 130 mg/dL (ref 0–200)
HDL: 59 mg/dL (ref >40)
LDL Cholesterol: 62 mg/dL (ref 0–99)
Total CHOL/HDL Ratio: 2.2 RATIO
Triglycerides: 44 mg/dL (ref <150)
VLDL: 9 mg/dL (ref 0–40)

## 2020-05-08 LAB — HEMOGLOBIN A1C
Hgb A1c MFr Bld: 5.7 % — ABNORMAL HIGH (ref 4.8–5.6)
Mean Plasma Glucose: 116.89 mg/dL

## 2020-05-08 LAB — CBG MONITORING, ED: Glucose-Capillary: 73 mg/dL (ref 70–99)

## 2020-05-08 MED ORDER — MAGNESIUM HYDROXIDE 400 MG/5ML PO SUSP
30.0000 mL | Freq: Once | ORAL | Status: DC | PRN
  Filled 2020-05-08: qty 30

## 2020-05-08 MED ORDER — ATORVASTATIN CALCIUM 40 MG PO TABS
40.0000 mg | ORAL_TABLET | Freq: Every day | ORAL | Status: DC
  Administered 2020-05-08: 40 mg via ORAL
  Filled 2020-05-08: qty 1

## 2020-05-08 MED ORDER — SULFAMETHOXAZOLE-TRIMETHOPRIM 800-160 MG PO TABS: 1.0000 | ORAL_TABLET | Freq: Two times a day (BID) | ORAL | 0 refills | Status: DC

## 2020-05-08 MED ORDER — LOPERAMIDE HCL 2 MG PO CAPS: 2.0000 mg | ORAL_CAPSULE | Freq: Every day | ORAL | Status: DC | PRN

## 2020-05-08 MED ORDER — BOOST PO LIQD: 237.0000 mL | Freq: Three times a day (TID) | ORAL | Status: DC

## 2020-05-08 MED ORDER — ALUM & MAG HYDROXIDE-SIMETH 200-200-20 MG/5ML PO SUSP: 30.0000 mL | Freq: Once | ORAL | Status: DC | PRN

## 2020-05-08 MED ORDER — BISMUTH SUBSALICYLATE 262 MG/15ML PO SUSP
15.0000 mL | ORAL | Status: DC | PRN
  Filled 2020-05-08: qty 236

## 2020-05-08 MED ORDER — SULFAMETHOXAZOLE-TRIMETHOPRIM 800-160 MG PO TABS
1.0000 | ORAL_TABLET | Freq: Two times a day (BID) | ORAL | Status: DC
  Administered 2020-05-08: 1 via ORAL
  Filled 2020-05-08: qty 1

## 2020-05-08 MED ORDER — ASPIRIN 81 MG PO CHEW
81.0000 mg | CHEWABLE_TABLET | Freq: Every morning | ORAL | Status: DC
  Administered 2020-05-08: 81 mg via ORAL
  Filled 2020-05-08: qty 1

## 2020-05-08 MED ORDER — PRO-STAT SUGAR FREE PO LIQD
30.0000 mL | Freq: Every day | ORAL | Status: DC
  Administered 2020-05-08: 30 mL via ORAL
  Filled 2020-05-08 (×2): qty 30

## 2020-05-08 MED ORDER — BRIMONIDINE TARTRATE 0.2 % OP SOLN: 2.0000 [drp] | Freq: Two times a day (BID) | OPHTHALMIC | Status: DC

## 2020-05-08 MED ORDER — SENNOSIDES-DOCUSATE SODIUM 8.6-50 MG PO TABS: 1.0000 | ORAL_TABLET | Freq: Every evening | ORAL | Status: DC | PRN

## 2020-05-08 MED ORDER — LOPERAMIDE HCL 2 MG PO CAPS: 2.0000 mg | ORAL_CAPSULE | Freq: Four times a day (QID) | ORAL | Status: DC | PRN

## 2020-05-08 MED ORDER — BACLOFEN 10 MG PO TABS
10.0000 mg | ORAL_TABLET | Freq: Three times a day (TID) | ORAL | Status: DC
  Administered 2020-05-08 (×2): 10 mg via ORAL
  Filled 2020-05-08 (×4): qty 1

## 2020-05-08 NOTE — Progress Notes (Signed)
OT Cancellation Note  Patient Details Name: Ronald Matthews MRN: [id number] DOB: Oct 15, 1933   Cancelled Treatment:    Reason Eval/Treat Not Completed: Patient at procedure or test/ unavailable (EEG starting in room)  Mateo Flow 05/08/2020, 11:23 AM

## 2020-05-08 NOTE — ED Notes (Signed)
Checked on pt to find purewick disconnected. Pt's sheet changed, pt repositioned, purewick reattached. Pt resting comfortably. EEG at bedside.

## 2020-05-08 NOTE — ED Notes (Signed)
Spoke with Internal medicine regarding pt new onset of sinus tach and continuous movement, no orders placed at this time. Pt repositioned in bed in safest position, continuous cardiac monitoring in place, call bell in reach.

## 2020-05-08 NOTE — ED Notes (Addendum)
Pt continuously moving in bed, pulling of cardiac leads and BP cuff, pt redirected with verbalized understanding. Pt has noted new onset of sinus tach, unable to obtain read on EKG monitor due to continuous movement. Admitting contacted

## 2020-05-08 NOTE — ED Notes (Signed)
Attempted to call report to nursing home. Could not get anyone on the phone. °

## 2020-05-08 NOTE — Procedures (Signed)
ELECTROENCEPHALOGRAM REPORT   Patient: Ronald Matthews       Room #: 042C EEG No. ID: [id number] Age: 86 y.o.        Sex: male Requesting Physician: Guilloud Report Date:  05/08/2020        Interpreting Physician: Thana Farr  History: Aldridge Krzyzanowski is an 86 y.o. male with an episode of altered mental status  Medications:  ASA, Lipitor, Lioresal  Conditions of Recording:  This is a 21 channel routine scalp EEG performed with bipolar and monopolar montages arranged in accordance to the international 10/20 system of electrode placement. One channel was dedicated to EKG recording.  The patient is in the awake and drowsy states.  Description:  The waking background activity consists of a low voltage, symmetrical, fairly well organized, 9 Hz alpha activity, seen from the parieto-occipital and posterior temporal regions.  Low voltage fast activity, poorly organized, is seen anteriorly and is at times superimposed on more posterior regions.  A mixture of theta and alpha rhythms are seen from the central and temporal regions. The patient drowses with slowing to irregular, low voltage theta and beta activity.   Stage II sleep is not obtained. No epileptiform activity is noted.   Hyperventilation and intermittent photic stimulation were not performed.  IMPRESSION: Normal electroencephalogram, awake and drowsy. There are no focal lateralizing or epileptiform features.   Thana Farr, MD Neurology [phone] 05/08/2020, 12:30 PM

## 2020-05-08 NOTE — ED Notes (Signed)
Called ptar for pt transport  

## 2020-05-08 NOTE — ED Notes (Signed)
Pt stable, resting in bed with eyes closed, no acute distress noted at this time.

## 2020-05-08 NOTE — Discharge Summary (Signed)
Name: Ronald PalmsHerman Sandell MRN: [id number] DOB: 12/15/1932 86 y.o. PCP: Galvin ProfferHague, Imran P, MD  Date of Admission: 05/07/2020  3:11 PM Date of Discharge: 05/08/2020 Attending Physician: Reymundo PollGuilloud, Carolyn, MD  Discharge Diagnosis: 1. Transient loss of consciousness 2/2 syncope vs seizure  2. UTI  3. Hematuria  Discharge Medications: Allergies as of 05/08/2020      Reactions   Cephalexin Other (See Comments)   Ppd [tuberculin Purified Protein Derivative] Other (See Comments)   "Allergic," per [facility]   Tuberculin Tests Other (See Comments)   [facility]      Medication List    TAKE these medications   acetaminophen 325 MG tablet Commonly known as: TYLENOL Take 2 tablets (650 mg total) by mouth every 4 (four) hours as needed for mild pain (or temp > 37.5 C (99.5 F)). What changed:   when to take this  additional instructions   aspirin 81 MG chewable tablet Chew 81 mg by mouth in the morning. What changed: Another medication with the same name was removed. Continue taking this medication, and follow the directions you see here.   atorvastatin 40 MG tablet Commonly known as: LIPITOR Take 1 tablet (40 mg total) by mouth daily at 6 PM. What changed: when to take this   Aveeno Eczema Therapy 1 % Crea Generic drug: Colloidal Oatmeal Apply 1 application topically See admin instructions. Apply to affected areas 2 times a day   baclofen 10 MG tablet Commonly known as: LIORESAL Take 10 mg by mouth 3 (three) times daily.   brimonidine 0.2 % ophthalmic solution Commonly known as: ALPHAGAN Place 2 drops into both eyes 2 (two) times daily.   feeding supplement (PRO-STAT SUGAR FREE 64) Liqd Take 30 mLs by mouth daily. CHERRY   FLEET ENEMA RE Place 1 enema rectally once as needed (for constipation not relieved by prune juice and milk of magnesia protocol and call MD if no relief from enema).   hydrocortisone cream 1 % Apply 1 application topically 2 (two) times daily.     lactose free nutrition Liqd Take 237 mLs by mouth 3 (three) times daily. VANILLA   loperamide 2 MG tablet Commonly known as: IMODIUM A-D Take 2 mg by mouth 4 (four) times daily as needed (after each loose stool- up to 4 doses in 12 hours and call MD if this diarrhea persists longer than 12 hours or is accompanied by severe abdominal pain). What changed: Another medication with the same name was removed. Continue taking this medication, and follow the directions you see here.   metoprolol succinate 25 MG 24 hr tablet Commonly known as: TOPROL-XL Take 12.5 mg by mouth daily.   Milk of Magnesia 400 MG/5ML suspension Generic drug: magnesium hydroxide Take 30 mLs by mouth once as needed for mild constipation.   multivitamins ther. w/minerals Tabs tablet Take 1 tablet by mouth daily.   Mylanta 200-200-20 MG/5ML suspension Generic drug: alum & mag hydroxide-simeth Take 30 mLs by mouth once as needed for indigestion or heartburn.   nitroGLYCERIN 0.4 MG SL tablet Commonly known as: NITROSTAT Place 0.4 mg under the tongue every 5 (five) minutes x 3 doses as needed for chest pain (AND CALL EMS IF NO RESOLUTION).   Pepto-Bismol 262 MG/15ML suspension Generic drug: bismuth subsalicylate Take 15 mLs by mouth every 2 (two) hours as needed for indigestion or diarrhea or loose stools (max of 6 doses in 24 hours).   ROBITUSSIN COUGH/COLD CF PO Take 10 mLs by mouth every 6 (six)  hours as needed (for cough).   senna-docusate 8.6-50 MG tablet Commonly known as: Senokot-S Take 1 tablet by mouth at bedtime as needed for mild constipation.   sulfamethoxazole-trimethoprim 800-160 MG tablet Commonly known as: BACTRIM DS Take 1 tablet by mouth every 12 (twelve) hours.   Travoprost (BAK Free) 0.004 % Soln ophthalmic solution Commonly known as: TRAVATAN Place 1 drop into both eyes at bedtime.   white petrolatum Gel Commonly known as: VASELINE Apply 1 application topically as needed (to affected  areas).   zinc oxide 20 % ointment Apply 1 application topically See admin instructions. Apply to affected areas 2 times a day and to the buttocks daily as needed for rash       Disposition and follow-up:   Ronald Matthews was discharged from [facility] in Stable condition.  At the hospital follow up visit please address:  1.  Transient LOC likely due to syncope vs seizure as per neuro. Minimal deficits. CT head showed no acute findings. CTA head and neck showed diffuse stenosis, chronic. EEG showed normal results with no focal lateralizing or epileptiform features. Mental status at baseline.   2. UTI likely acute uncomplicated cystitis. Urinalysis showed positive nitrites, small leukocytes, many bacteria and >50 WBCs on microscopy. Patient asymptomatic, but demented. Urine cultures pending. D/c with 7 day course of Bactrim. Will see PCP as outpatient in 6 weeks for repeat UA.  3. Hematuria possibly 2/2 UTI. Urinalysis shows large Hgb in urine dipstick with RBC/HPF 21-50. Patient asymptomatic, no signs of dysuria, abdominal pain. Hgb stable at 10.2. Cr 1, BUN 26 (normal renal function). Urine cultures pending. Repeat UA in 6 weeks with PCP. If hematuria not resolved, consider seeing outpatient urologist for further workup.  3.  Labs / imaging needed at time of follow-up: Urinalysis, CBC, CMP  4.  Pending labs/ test needing follow-up: Urine culture  Follow-up Appointments:  Follow-up Information    Hague, Myrene Galas, MD. Schedule an appointment as soon as possible for a visit in 1 week(s).   Specialty: Internal Medicine Contact information: [address] [place] [address] [phone]               Hospital Course by problem list: 1. Transient LOC 2/2 Syncope vs Seizure Patient is an 86 year old male with a history of CVA (07/2018), vascular dementia, CAD, HTN, and HLD who presents to the ED as a Code Stroke. As per resident care director, Daryel Gerald,  at [facility] assisted care facility, patient had lunch and then was found to be drooling, staring off, unresponsive to voice, possibly weak on L side. EMS arrived, Code Stroke activated. Upon arrival, patient went to CT with the stroke team, NIHSS of 8 with deficits of disorientation, right facial droop, bilateral leg weakness, expressive aphasia, and dysarthria on exam. CTH and CTA head and neck were performed. Patient deemed not a candidate for tPA because symptoms were too minor treat per stroke team MD. CT Head w/o contrast was obtained, showed no acute intracranial abnormality CT head and neck obtained, showed diffuse stenosis, chronic. EKG was obtained, showed 1st degree AV block but no acute ST changes. CBC and CMP showed no clear signs of infection, electrolyte abnormalities, or hypoglycemia. Urine drug screen was negative. Lipid panel was within normal limits. Neuro was consulted, advised that they believed patient's presentation was due to transient LOC, but unsure whether due to syncope or seizure. Neuro recommended obtaining an EEG, which was normal, so no further workup necessary at  this time. Patient is back at baseline mental and functional status at time of discharge.   2. Acute Uncomplicated Cystitis Urinalysis was ordered due to patient's altered mental status, showed positive nitrites, small leukocytes, many bacteria on microscopy, >50 WBCs on microscopy. Patient did not complain of any signs of infection, but is demented. UA also showed presence of hematuria and thus urine cultures were ordered. Urine cultures are still pending. Will send home with 7 day course of Bactrim. Patient will see PCP as outpatient in 6 weeks for repeat UA.  3. Hematuria As mentioned above, urinalysis was ordered due to patient's altered mental status, showed large Hgb in urine dipstick and RBC/HPF 21-50. Hgb upon admission was 10.2 and remained the same throughout admission so stable. Renal function was normal  with Cr 1, BUN 26. Prior urinalysis in 07/11/18 showed small Hgb in urine dipstick with 0-5 RBC/HPF and subsequent urinalysis in 11/27/19 showed moderate Hgb in urine dipstick with 0-5 RBC/HPF. Therefore, current UA seemed concerning since Hgb in urine dipstick appeared to be progressively increasing and there was a sudden appearance of RBC/HPF 21-50. Hematuria workup started. Urine cultures still pending. Repeat urinalysis in 6 weeks with PCP. If hematuria not resolved, consider seeing outpatient urologist for further workup.  Discharge Vitals:   BP (!) 123/48 (BP Location: Right Arm)   Pulse 64   Temp 98 F (36.7 C) (Oral)   Resp 12   SpO2 98%   Pertinent Labs, Studies, and Procedures:  CBC Latest Ref Rng & Units 05/08/2020 05/07/2020 05/07/2020  WBC 4.0 - 10.5 K/uL 5.8 - 5.5  Hemoglobin 13.0 - 17.0 g/dL 10.2(L) 11.2(L) 10.2(L)  Hematocrit 39 - 52 % 31.3(L) 33.0(L) 33.4(L)  Platelets 150 - 400 K/uL 245 - 241   BMP Latest Ref Rng & Units 05/08/2020 05/07/2020 05/07/2020  Glucose 70 - 99 mg/dL 91 283(M) 629(U)  BUN 8 - 23 mg/dL 21 76(L) 46(T)  Creatinine 0.61 - 1.24 mg/dL 0.35 4.65 6.81  Sodium 135 - 145 mmol/L 140 140 139  Potassium 3.5 - 5.1 mmol/L 4.3 4.0 3.9  Chloride 98 - 111 mmol/L 106 104 105  CO2 22 - 32 mmol/L 23 - 22  Calcium 8.9 - 10.3 mg/dL 9.0 - 9.1   Urinalysis    Component Value Date/Time   COLORURINE YELLOW 05/07/2020 1708   APPEARANCEUR CLOUDY (A) 05/07/2020 1708   LABSPEC 1.033 (H) 05/07/2020 1708   PHURINE 6.0 05/07/2020 1708   GLUCOSEU NEGATIVE 05/07/2020 1708   HGBUR LARGE (A) 05/07/2020 1708   BILIRUBINUR NEGATIVE 05/07/2020 1708   KETONESUR NEGATIVE 05/07/2020 1708   PROTEINUR 30 (A) 05/07/2020 1708   NITRITE POSITIVE (A) 05/07/2020 1708   LEUKOCYTESUR SMALL (A) 05/07/2020 1708   Drugs of Abuse     Component Value Date/Time   LABOPIA NONE DETECTED 05/07/2020 1708   COCAINSCRNUR NONE DETECTED 05/07/2020 1708   LABBENZ NONE DETECTED 05/07/2020 1708   AMPHETMU  NONE DETECTED 05/07/2020 1708   THCU NONE DETECTED 05/07/2020 1708   LABBARB NONE DETECTED 05/07/2020 1708     EKG EKG Interpretation  Date/Time:                  Tuesday May 07 2020 15:49:12 EDT Ventricular Rate:         69 PR Interval:                 306 QRS Duration: 70 QT Interval:  412 QTC Calculation:        441 R Axis:                         54 Text Interpretation:      Sinus rhythm with 1st degree A-V block Nonspecific ST and T wave abnormality Abnormal ECG Artifact noted. Similar to Jan 2021 tracing No STEMI Confirmed by Alona Bene [phone]) on 05/07/2020 3:57:06 PM   EEG:  IMPRESSION: Normal electroencephalogram, awake and drowsy. There are no focal lateralizing or epileptiform features.   Radiology:   CT Code Stroke CTA Head W/WO contrast  Result Date: 05/07/2020 CLINICAL DATA: Focal neuro deficit, greater than 6 hours, stroke suspected. Additional provided: Left-sided weakness. EXAM: CT ANGIOGRAPHY HEAD AND NECK TECHNIQUE: Multidetector CT imaging of the head and neck was performed using the standard protocol during bolus administration of intravenous contrast. Multiplanar CT image reconstructions and MIPs were obtained to evaluate the vascular anatomy. Carotid stenosis measurements (when applicable) are obtained utilizing NASCET criteria, using the distal internal carotid diameter as the denominator. CONTRAST: 1mL OMNIPAQUE IOHEXOL 350 MG/ML SOLN COMPARISON: Concurrently performed noncontrast head CT 05/07/2020, CT head 05/18/2019, MRA head 05/18/2019 FINDINGS: CTA NECK FINDINGS Aortic arch: Common origin of the innominate and left common carotid arteries atherosclerotic plaque within the visualized aortic arch and proximal major branch vessels of the neck. No hemodynamically significant innominate or proximal subclavian artery stenosis. Right carotid system: CCA and ICA patent within the neck without significant stenosis (50% or greater). Mild calcified  plaque within the carotid bifurcation and proximal ICA. Left carotid system: CCA and ICA patent within the neck without significant stenosis (50% or greater). Mild calcified plaque within the carotid bifurcation and proximal ICA. Vertebral arteries: The right vertebral artery is dominant. There is scattered atherosclerotic plaque within the right vertebral artery. Atherosclerotic plaque results in moderate stenosis within the proximal right V2 segment (series 7, image 136). Moderate/severe atherosclerotic narrowing at the origin of the non dominant left vertebral artery. Redemonstrated sites of high-grade stenosis within the left vertebral artery at the level of the skull base Skeleton: No acute bony abnormality or aggressive osseous lesion. Cervical spondylosis with multilevel disc space narrowing, posterior disc osteophytes, uncovertebral and facet hypertrophy. There also prominent multilevel ventral osteophytes. Degenerative fusion across the facet joints at the C2-C3 level. Other neck: No neck mass or cervical lymphadenopathy. Thyroid unremarkable. Upper chest: No consolidation within the imaged lung apices. Review of the MIP images confirms the above findings CTA HEAD FINDINGS Anterior circulation: The intracranial internal carotid arteries are patent. Calcified plaque within both vessels noncalcified plaque results in moderate stenosis of the pre cavernous left ICA (series 7, image 253). Otherwise, no more than mild intracranial ICA stenosis. The M1 middle cerebral arteries are patent without significant stenosis. No M2 proximal branch occlusion is identified. There are moderate stenoses within multiple mid to distal M2 right MCA branch vessels. The anterior cerebral arteries are patent without significant proximal stenosis. No intracranial aneurysm is identified. Posterior circulation: The dominant intracranial right vertebral artery is patent. Mild calcified plaque within this vessel without significant  stenosis. Multifocal high-grade stenoses within the non dominant left vertebral artery at the level of the skull base. 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 The basilar artery is patent without significant stenosis. The posterior cerebral arteries are patent bilaterally without significant proximal stenosis. There is a moderate stenosis within the P3 left PCA. Posterior communicating arteries are hypoplastic or absent bilaterally.  Venous sinuses: Within limitations of contrast timing, no convincing thrombus. Anatomic variants: As described Review of the MIP images confirms the above findings No emergent large vessel occlusion. These results were communicated to Dr. Amada Jupiter at 4:09 pmon 7/6/2021by text page via the [facility] messaging system. IMPRESSION: CTA neck: 1. The bilateral common and internal carotid arteries are patent within the neck without significant stenosis. Mild atherosclerotic plaque within the carotid bifurcations and proximal ICAs. 2. Moderate stenosis within the proximal V2 segment of the dominant right vertebral artery. 3. Moderate/severe atherosclerotic narrowing at the origin of the non dominant left vertebral artery. Redemonstrated multifocal high-grade stenoses within the left vertebral artery at the level of the skull base.      CTA head: 1. No intracranial large vessel occlusion. 2. Intracranial atherosclerotic disease with multifocal stenoses, most notably as follows. 3. Unchanged multifocal high-grade stenoses within the non dominant left vertebral artery at the level of the skull base. 4. Unchanged moderate stenosis of the pre cavernous left internal carotid artery. 5. Moderate stenoses within multiple mid to distal M2 right MCA branch vessels. 6. Moderate focal stenosis within the P3 left posterior cerebral artery. Electronically Signed By: Jackey Loge DO On: 05/07/2020 16:10   CT Code Stroke CTA Neck W/WO contrast  Result Date:  05/07/2020 CLINICAL DATA: Focal neuro deficit, greater than 6 hours, stroke suspected. Additional provided: Left-sided weakness. EXAM: CT ANGIOGRAPHY HEAD AND NECK TECHNIQUE: Multidetector CT imaging of the head and neck was performed using the standard protocol during bolus administration of intravenous contrast. Multiplanar CT image reconstructions and MIPs were obtained to evaluate the vascular anatomy. Carotid stenosis measurements (when applicable) are obtained utilizing NASCET criteria, using the distal internal carotid diameter as the denominator. CONTRAST: 64mL OMNIPAQUE IOHEXOL 350 MG/ML SOLN COMPARISON: Concurrently performed noncontrast head CT 05/07/2020, CT head 05/18/2019, MRA head 05/18/2019 FINDINGS: CTA NECK FINDINGS Aortic arch: Common origin of the innominate and left common carotid arteries atherosclerotic plaque within the visualized aortic arch and proximal major branch vessels of the neck. No hemodynamically significant innominate or proximal subclavian artery stenosis. Right carotid system: CCA and ICA patent within the neck without significant stenosis (50% or greater). Mild calcified plaque within the carotid bifurcation and proximal ICA. Left carotid system: CCA and ICA patent within the neck without significant stenosis (50% or greater). Mild calcified plaque within the carotid bifurcation and proximal ICA. Vertebral arteries: The right vertebral artery is dominant. There is scattered atherosclerotic plaque within the right vertebral artery. Atherosclerotic plaque results in moderate stenosis within the proximal right V2 segment (series 7, image 136). Moderate/severe atherosclerotic narrowing at the origin of the non dominant left vertebral artery. Redemonstrated sites of high-grade stenosis within the left vertebral artery at the level of the skull base Skeleton: No acute bony abnormality or aggressive osseous lesion. Cervical spondylosis with multilevel disc space narrowing, posterior  disc osteophytes, uncovertebral and facet hypertrophy. There also prominent multilevel ventral osteophytes. Degenerative fusion across the facet joints at the C2-C3 level. Other neck: No neck mass or cervical lymphadenopathy. Thyroid unremarkable. Upper chest: No consolidation within the imaged lung apices. Review of the MIP images confirms the above findings CTA HEAD FINDINGS Anterior circulation: The intracranial internal carotid arteries are patent. Calcified plaque within both vessels noncalcified plaque results in moderate stenosis of the pre cavernous left ICA (series 7, image 253). Otherwise, no more than mild intracranial ICA stenosis. The M1 middle cerebral arteries are patent without significant stenosis. No M2 proximal branch occlusion is identified. There are moderate  stenoses within multiple mid to distal M2 right MCA branch vessels. The anterior cerebral arteries are patent without significant proximal stenosis. No intracranial aneurysm is identified. Posterior circulation: The dominant intracranial right vertebral artery is patent. Mild calcified plaque within this vessel without significant stenosis. Multifocal high-grade stenoses within the non dominant left vertebral artery at the level of the skull base. 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 The basilar artery is patent without significant stenosis. The posterior cerebral arteries are patent bilaterally without significant proximal stenosis. There is a moderate stenosis within the P3 left PCA. Posterior communicating arteries are hypoplastic or absent bilaterally. Venous sinuses: Within limitations of contrast timing, no convincing thrombus. Anatomic variants: As described Review of the MIP images confirms the above findings No emergent large vessel occlusion. These results were communicated to Dr. Amada Jupiter at 4:09 pmon 7/6/2021by text page via the [facility] messaging system. IMPRESSION: CTA neck: 1. The bilateral  common and internal carotid arteries are patent within the neck without significant stenosis. Mild atherosclerotic plaque within the carotid bifurcations and proximal ICAs. 2. Moderate stenosis within the proximal V2 segment of the dominant right vertebral artery. 3. Moderate/severe atherosclerotic narrowing at the origin of the non dominant left vertebral artery. Redemonstrated multifocal high-grade stenoses within the left vertebral artery at the level of the skull base.      CTA head: 1. No intracranial large vessel occlusion. 2. Intracranial atherosclerotic disease with multifocal stenoses, most notably as follows. 3. Unchanged multifocal high-grade stenoses within the non dominant left vertebral artery at the level of the skull base. 4. Unchanged moderate stenosis of the pre cavernous left internal carotid artery. 5. Moderate stenoses within multiple mid to distal M2 right MCA branch vessels. 6. Moderate focal stenosis within the P3 left posterior cerebral artery. Electronically Signed By: Jackey Loge DO On: 05/07/2020 16:10   CT HEAD CODE STROKE WO CONTRAST  Result Date: 05/07/2020 CLINICAL DATA: Code stroke. Ataxia, stroke suspected. EXAM: CT HEAD WITHOUT CONTRAST TECHNIQUE: Contiguous axial images were obtained from the base of the skull through the vertex without intravenous contrast. COMPARISON: Noncontrast head CT 11/27/2019, CT angiogram head/neck 04/18/2019, brain MRI 04/18/2019. FINDINGS: Brain: Stable mild generalized cerebral atrophy with advanced cerebellar atrophy. Stable advanced patchy and confluent hypoattenuation within the cerebral white matter which is nonspecific, but consistent with chronic small vessel ischemic disease. Known chronic infarct within the paramedian right pons better appreciated on prior MRI. There is no acute intracranial hemorrhage. No acute demarcated cortical infarct is identified. No extra-axial fluid collection. No evidence of intracranial mass. No midline  shift. Vascular: No hyperdense vessel. Atherosclerotic calcifications Skull: Normal. Negative for fracture or focal lesion. Sinuses/Orbits: Visualized orbits show no acute finding. No significant paranasal sinus disease or mastoid effusion at the imaged levels. ASPECTS ([place]ber[place] Stroke Program Early CT Score) - Ganglionic level infarction (caudate, lentiform nuclei, internal capsule, insula, M1-M3 cortex): 7 - Supraganglionic infarction (M4-M6 cortex): 3 Total score (0-10 with 10 being normal): 10 These results were communicated to Dr. Amada Jupiter At 3:41 pmon 7/6/2021by text page via the [facility] messaging system. IMPRESSION: 1. No CT evidence of acute intracranial abnormality. ASPECTS is 10. 2. Stable parenchymal atrophy with advanced chronic small vessel ischemic disease. 3. Known chronic infarct within the paramedian right pons, better appreciated on prior MRI 04/18/2019. Electronically Signed By: Jackey Loge DO On: 05/07/2020 15:41     Discharge Instructions: Discharge Instructions    Call MD for:  difficulty breathing, headache or visual disturbances   Complete by: As  directed    Call MD for:  extreme fatigue   Complete by: As directed    Call MD for:  hives   Complete by: As directed    Call MD for:  persistant dizziness or light-headedness   Complete by: As directed    Call MD for:  persistant nausea and vomiting   Complete by: As directed    Call MD for:  redness, tenderness, or signs of infection (pain, swelling, redness, odor or green/yellow discharge around incision site)   Complete by: As directed    Call MD for:  severe uncontrolled pain   Complete by: As directed    Call MD for:  temperature >100.4   Complete by: As directed    Diet - low sodium heart healthy   Complete by: As directed    Discharge instructions   Complete by: As directed    Hi Mr. Mincey, it was a pleasure taking care of you during your time here!  As we discussed, the reason you were brought here was  because of a likely transient loss of consciousness. We did not find any evidence of a stroke on imaging.  We did find that you have a urinary tract infection and that you have blood in your urine. Therefore, we are sending you home with a 7-day course of antibiotics to treat your urinary tract infection, which may also help resolve the blood in your urine. We recommend that you follow up with your primary care physician in 6 weeks in order to obtain another urinalysis to see if your UTI and the blood in your urine have resolved.   If there is still blood in your urine 6 weeks from now at the visit with your primary care physician, we do recommend seeing a urologist as an outpatient for further work up.   Increase activity slowly   Complete by: As directed    Increase activity slowly   Complete by: As directed       Signed: Merrilyn Puma, MD 05/08/2020, 5:12 PM   Pager: [phone]

## 2020-05-08 NOTE — ED Notes (Addendum)
Pt attempting to get out of bed, pt redirected into bed with verbalized understanding, repositioned in and adjusted to safest position. Pt sinus tach on monitor HR 161 cardiac leads repositioned appropriately, view of lead changed, radial pulse 82 bmp, 12 lead captured, pt appears asymptomatic with no acute distress responding appropriately to commands at this time. Pt provided warm blanket, resting and comfortable at this time.

## 2020-05-08 NOTE — ED Notes (Signed)
Attempted to call report to nursing home. Could not get anyone on the phone.

## 2020-05-08 NOTE — Progress Notes (Signed)
Subjective: Patient seen at bedside.   Patient seems to be at baseline mental status. He is oriented to person and place, not to time.He reports that he is still having some shoulder pain. He denies any dysuria, abdominal pain, or other symptoms.   Talked with the Tammy at [facility] nursing home. States that, at baseline, patient was oriented to person and place but not to time. States that he is able talk to them, is able to move around in a wheelchair, and is able to pivot and move up and down. Also states that patient smokes cigarettes daily.  Objective:  Vital signs in last 24 hours: Vitals:   05/08/20 0436 05/08/20 0734 05/08/20 0800 05/08/20 0900  BP:  (!) 160/115 (!) 130/58 (!) 125/57  Pulse: 85 85 81   Resp: 17 15 (!) 21 19  Temp:      TempSrc:      SpO2: 95% 97% 98%    Physical Exam Constitutional:      Comments: Patient is lying comfortably in bed, NAD. Has his left arm adducted and internally rotated.  HENT:     Head: Normocephalic and atraumatic.     Mouth/Throat:     Mouth: Mucous membranes are moist.     Pharynx: Oropharynx is clear. No oropharyngeal exudate or posterior oropharyngeal erythema.  Cardiovascular:     Rate and Rhythm: Normal rate and regular rhythm.     Pulses: Normal pulses.     Heart sounds: Normal heart sounds. No murmur heard.  No friction rub. No gallop.   Pulmonary:     Effort: Pulmonary effort is normal. No respiratory distress.     Breath sounds: Normal breath sounds. No stridor. No wheezing or rhonchi.  Abdominal:     General: Bowel sounds are normal. There is no distension.     Palpations: Abdomen is soft. There is no mass.     Tenderness: There is no abdominal tenderness. There is no guarding or rebound. Neurological:     General: No focal deficit present.     Mental Status: Mental status is at baseline.     Cranial Nerves: No cranial nerve deficit.     Comments: Alert and oriented to person and place, but not to time. Sensation is  intact bilaterally in the upper extremities and face. Sensation is intact in the R lower extremity Decreased sensation in distal left foot (at toes). Muscle strength is appropriate in the right arm and bilateral lower extremities. Slightly decreased muscle strength in the left arm. 2+ DTRs in bilateral arms and right lower leg. Unable to elicit reflex in left lower leg.   Assessment/Plan:  Active Problems:   Syncope  1. Transient LOC 2/2 Syncope vs Seizure -  CT Head w/o contrast --> showed no acute intracranial abnormality - CTA Head --> showed no intracranial large vessel occlusion. Does show intracranial atherosclerotic dx w/multifocal stenoses - CTA Neck --> showed moderate stenosis in proximal segment of dominant R vertebral artery. Also shows moderate/severe atherosclerotic narrowing at origin of non-dominant left vertebral artery - EKG showed 1st degree AV block but no acute ST changes - No clear signs of infection or electrolyte abnormalities - Normoglycemic and not on any diabetic medications so hypoglycemia not likely - UDS negative - Lipid panel wnl - Patient stays in nursing facility so likely compliant with medications - Neuro on board - As per neuro, f/u EEG. If normal, no further treatment needed - MRI head is relatively low yield at this time  so will hold off - On seizure precautions - Normal EEG results - Monitor tele, will discontinue at time of discharge  2. Acute Uncomplicated Cystitis - Urinalysis shows positive nitrites, small leukocytes, many bacteria on microscopy, >50 WBCs on microscopy - Patient not complaining of any signs of infection. However, of note, patient is demented. - D/t presence of hematuria, will obtain urine cultures to definitively rule out UTI - Urine cultures pending - Will send home with 7 day course of Bactrim - See PCP as outpatient in 6 weeks for repeat UA  3. Hematuria - Urinalysis shows large Hgb in urine dipstick and RBC/HPF 21-50 -  Hgb 10.2 - Cr 1.00, BUN 26 --> renal function appears to be normal - 07/11/18 urinalysis --> showed small Hgb in urine dipstick with 0-5 RBC/HPF - 11/27/19 urinalysis --> showed moderate Hgb in urine dipstick with 0-5 RBC/HPF - Hgb in urine dipstick seems to be progressively increasing when compared to prior urinalyses. Sudden appearance of RBC/HPF 21-50 - Urine cultures pending - Repeat urinalysis in 6 weeks with PCP; if hematuria not resolved, consider seeing outpatient urologist for further workup  4. CAD, HTN, HLD - Bedside swallow was tolerable. Will resume home meds at discharge.  5. Dementia - Appears to be at baseline  Prior to Admission Living Arrangement: [facility] Anticipated Discharge Location: [facility] Barriers to Discharge: NONE Dispo: Anticipated discharge today.   Merrilyn Puma, MD 05/08/2020, 9:34 AM Pager: [phone] After 5pm on weekdays and 1pm on weekends: On Call pager [phone]

## 2020-05-08 NOTE — Consult Note (Signed)
Neurology Consultation Reason for Consult: Confusion Referring Physician: Adela Lank, D  CC: Confusion  History is obtained from:patient, nsg home staff.   HPI: Ronald Matthews is a 86 y.o. male with a history of vascular dementia and hypertension who was in his normal state of health at 1:45 PM.  He had just recently eaten his lunch is normal.  The nurse then went back into check on him and found him slumped over and drooling.  EMS found him confused, and they reported left-sided drift in transit.  For this reason a code stroke was activated.  He continued improving, but was still somewhat confused on arrival to the bridge, however by the time the CT was completed he was already markedly improving.   LKW: 1:45 PM tpa given?: no, rapidly improving    ROS: A 14 point ROS was performed and is negative except as noted in the HPI.   Past Medical History:  Diagnosis Date  . Coronary artery disease   . Dementia (HCC)   . Hypertension   . Thrombocytopenia (HCC)      Family history: Unable to obtain due to altered mental status.   Social History:  reports that he has been smoking. He has never used smokeless tobacco. He reports that he does not drink alcohol and does not use drugs.   Exam: Current vital signs: BP (!) 145/71 (BP Location: Right Arm)   Pulse 85   Temp 98.2 F (36.8 C) (Oral)   Resp 17   SpO2 95%  Vital signs in last 24 hours: Temp:  [98.2 F (36.8 C)] 98.2 F (36.8 C) (07/06 1558) Pulse Rate:  [49-172] 85 (07/07 0436) Resp:  [11-21] 17 (07/07 0436) BP: (96-165)/(44-94) 145/71 (07/07 0430) SpO2:  [95 %-99 %] 95 % (07/07 0436)   Physical Exam  Constitutional: Appears elderly Psych: Initially slightly confused Eyes: No scleral injection HENT: No OP obstrucion MSK: no joint deformities.  Cardiovascular: Normal rate and regular rhythm.  Respiratory: Effort normal, non-labored breathing GI: Soft.  No distension. There is no tenderness.  Skin:  WDI  Neuro: Mental Status: Patient is awake, alert, not oriented.  He is able to name objects and his speech is relatively fluent.  He is dysarthric. Cranial Nerves: II: Visual Fields are full. Pupils are equal, round, and reactive to light.   III,IV, VI: EOMI without ptosis or diploplia.  V: Facial sensation is symmetric to temperature VII: Facial movement with right facial weakness VIII: hearing is intact to voice X: Uvula elevates symmetrically XI: Shoulder shrug is symmetric. XII: tongue is midline without atrophy or fasciculations.  Motor: He has a mild spastic hemiparesis on the left though he does not drift, does not give great effort in either leg Sensory: Sensation is symmetric to light touch and temperature in the arms and legs. Cerebellar: Does not perform   I have reviewed labs in epic and the results pertinent to this consultation are: Bmp - unremarkable  I have reviewed the images obtained: CT head-negative, CTA-no acute findings  Impression: 86 year old male with history of vascular dementia who presents with transient alteration in awareness.  He is amnestic to the event, though I am not certain if this is due to his dementia or the etiology of the event.  He has some deficits at baseline, and therefore I am not sure that the focal deficits that EMS saw were truly representing the current acute event versus recrudescence.  Possibilities I think include seizure versus syncope, or much less likely TIA.  Recommendations: 1) EEG 2) could consider MRI to evaluate for areas of residual ischemia, though I suspect this is relatively low yield. 3) neurology will continue to follow   Ritta Slot, MD [facility] [phone]  If 7pm- 7am, please page neurology on call as listed in AMION.

## 2020-05-08 NOTE — Discharge Planning (Signed)
Transition of Care [facility]) - Emergency Department Mini Assessment   Patient Details  Name: Ronald Matthews MRN: [id number] Date of Birth: [birth year]  Transition of Care [facility]) CM/SW Contact:    Oletta Cohn, RN Phone Number: 05/08/2020, 3:52 PM   Clinical Narrative: [facility] consulted regarding pt returning to [facility].  [facility] contacted staff who was concerned because they felt that pt was "not himself."  RNCM relayed information to attending, sho talked with staff.  Pt cleared to return to [facility] via PTAR.   ED Mini Assessment: What brought you to the Emergency Department? : confused  Barriers to Discharge: Barriers Resolved     Means of departure: Hospital Transport       Patient Contact and Communications Key Contact 1: Alvis Lemmings [phone]     Contact Date: 05/08/20,   Contact time: 1518 Contact Phone Number: [phone]           Admission diagnosis:  Syncope [R55] Patient Active Problem List   Diagnosis Date Noted  . Syncope 05/07/2020  . Dehydration 11/28/2019  . Altered mental status 11/27/2019  . Jaw pain   . CVA (cerebral vascular accident) (HCC) 04/17/2019  . Ischemic stroke (HCC) 07/15/2018  . Hypertension 07/15/2018  . Dementia (HCC) 07/15/2018  . Coronary artery disease 07/15/2018   PCP:  Galvin Proffer, MD Pharmacy:   [facility] - [place], [place] - [address] [place] [address] Phone: [phone] Fax: [phone]

## 2020-05-08 NOTE — Progress Notes (Signed)
Pt was seen for mobility evaluation and note his tolerance for activity is low.  Has required mod assist to maintain sitting balance control, will expect him to transition to SNF for care due to dependent mobility and low independence for standing and controlling standing balance.  Follow acutely for work on ROM and strength of LE's, standing preparation and control of balance in sitting and standing.  05/08/20 1600  PT Visit Information  Last PT Received On 05/08/20  Assistance Needed +2  History of Present Illness 86 yo male with onset of slumping over and drooling after his lunch was sent by ALF to hosp.  Noted he now is weak on his L side, but pt is a wheelchair walker at baseline, I to propel around facility.  New notations of stenosis at B vertebral arteries, R MCA, L posterior cerebral artery.   PMHx:  CAD, vascular dementia, HTN, CVA, HLD, thrombocytopenia, smoker  Precautions  Precautions Fall  Precaution Comments L side leaning in sitting  Restrictions  Weight Bearing Restrictions No  Home Living  Family/patient expects to be discharged to: Skilled nursing facility  Additional Comments Pt. resides at [facility].  Prior Function  Level of Independence Needs assistance  Gait / Transfers Assistance Needed wheelchair to self propel  ADL's / Homemaking Assistance Needed assistance for bathing and dressing  Comments information from care manager at [facility]  Communication  Communication No difficulties  Pain Assessment  Pain Assessment Faces  Faces Pain Scale 4  Pain Location B knees with stretching  Pain Descriptors / Indicators Tightness  Pain Intervention(s) Monitored during session;Repositioned  Cognition  Arousal/Alertness Lethargic  Behavior During Therapy Flat affect  Overall Cognitive Status History of cognitive impairments - at baseline  General Comments pt cannot fully recall all his history, thinks he lives in his own home bbut lives in ALF  Upper Extremity  Assessment  Upper Extremity Assessment Generalized weakness;LUE deficits/detail  LUE Deficits / Details IR'd shoulder and minimal elbow extension   Lower Extremity Assessment  Lower Extremity Assessment Generalized weakness (B hip and knee flexion contractures)  Cervical / Trunk Assessment  Cervical / Trunk Assessment Kyphotic  Bed Mobility  Overal bed mobility Needs Assistance  Bed Mobility Sidelying to Sit;Sit to Sidelying  Sidelying to sit Mod assist  Sit to sidelying Max assist  General bed mobility comments pt is unable to fully plan the sequence, and is not able to use LUE to assist himself  Transfers  Overall transfer level Needs assistance  General transfer comment pt is unable to stand due to contractures of LE's  Ambulation/Gait  General Gait Details not ambulatory at baseline  Balance  Overall balance assessment Needs assistance  Sitting-balance support Feet supported  Sitting balance-Leahy Scale Poor  Standing balance-Leahy Scale Zero  General Comments  General comments (skin integrity, edema, etc.) pt is in contractures of LE's and therefore cannot stand, but previously was assisted to get into wheelchair and self propel  Exercises  Exercises Other exercises (B knees in 30 deg flexion contractures, B hips flexion contr)  PT - End of Session  Activity Tolerance Patient limited by fatigue;Treatment limited secondary to medical complications (Comment)  Patient left in bed;with call bell/phone within reach  Nurse Communication Mobility status  PT Assessment  PT Recommendation/Assessment Patient needs continued PT services  PT Visit Diagnosis Other abnormalities of gait and mobility (R26.89);Muscle weakness (generalized) (M62.81);Difficulty in walking, not elsewhere classified (R26.2);Adult, failure to thrive (R62.7);Pain  Pain - Right/Left  (both)  Pain -  part of body Knee  PT Problem List Decreased strength;Decreased range of motion;Decreased activity  tolerance;Decreased balance;Decreased coordination;Decreased mobility;Decreased cognition;Decreased knowledge of use of DME;Decreased safety awareness;Decreased skin integrity;Pain  Barriers to Discharge Decreased caregiver support  Barriers to Discharge Comments has ALF level support, two person help needed to move  PT Plan  PT Frequency (ACUTE ONLY) Min 2X/week  PT Treatment/Interventions (ACUTE ONLY) DME instruction;Functional mobility training;Therapeutic activities;Therapeutic exercise;Balance training;Neuromuscular re-education;Patient/family education  AM-PAC PT "6 Clicks" Mobility Outcome Measure (Version 2)  Help needed turning from your back to your side while in a flat bed without using bedrails? 2  Help needed moving from lying on your back to sitting on the side of a flat bed without using bedrails? 2  Help needed moving to and from a bed to a chair (including a wheelchair)? 1  Help needed standing up from a chair using your arms (e.g., wheelchair or bedside chair)? 1  Help needed to walk in hospital room? 1  Help needed climbing 3-5 steps with a railing?  1  6 Click Score 8  Consider Recommendation of Discharge To: CIR/SNF/LTACH  PT Recommendation  Follow Up Recommendations SNF  PT equipment None recommended by PT  Individuals Consulted  Consulted and Agree with Results and Recommendations Patient unable/family or caregiver not available  Acute Rehab PT Goals  Patient Stated Goal none stated  PT Goal Formulation Patient unable to participate in goal setting  Time For Goal Achievement 05/22/20  Potential to Achieve Goals Fair  PT Time Calculation  PT Start Time (ACUTE ONLY) 1530  PT Stop Time (ACUTE ONLY) 1540  PT Time Calculation (min) (ACUTE ONLY) 10 min  PT General Charges  $$ ACUTE PT VISIT 1 Visit  PT Evaluation  $PT Eval Moderate Complexity 1 Mod  Written Expression  Dominant Hand Right    Samul Dada, PT MS Acute Rehab Dept. Number: [facility] [id number] and [facility]  [phone]

## 2020-05-08 NOTE — Progress Notes (Signed)
EEG Completed; Results Pending  

## 2020-05-10 LAB — URINE CULTURE: Culture: 60000 — AB

## 2020-05-11 ENCOUNTER — Telehealth: Payer: Self-pay | Admitting: Emergency Medicine

## 2020-05-11 NOTE — Telephone Encounter (Signed)
Post ED Visit - Positive Culture Follow-up  Culture report reviewed by antimicrobial stewardship pharmacist: [facility] []  , Pharm.D. []  Enzo Bi, Pharm.D., BCPS AQ-ID []  , Pharm.D., BCPS []  Celedonio Miyamoto, .D., BCPS []  [place], .D., BCPS, AAHIVP []  Georgina Pillion, Pharm.D., BCPS, AAHIVP []  [address], PharmD, BCPS []  , PharmD, BCPS []  [place], PharmD, BCPS []  [address], PharmD []  , PharmD, BCPS [x]  Estella Husk, PharmD  Pharmacy Team []  Lysle Pearl, PharmD []  , PharmD []  Phillips Climes, PharmD []  , Rph []  Agapito Games) , PharmD []  Verlan Friends, PharmD []  , PharmD []  Mervyn Gay, PharmD []  , PharmD []  Vinnie Level, PharmD []  [facility], PharmD []  , PharmD []  Len Childs, PharmD   Positive urine culture Treated with Sulfamethoxazole-Trimethoprim, organism sensitive to the same and no further patient follow-up is required at this time.  Sherrika Weakland 05/11/2020, 12:00 PM

## 2020-06-22 ENCOUNTER — Other Ambulatory Visit: Payer: Self-pay

## 2020-06-22 ENCOUNTER — Inpatient Hospital Stay
Admission: EM | Admit: 2020-06-22 | Discharge: 2020-06-25 | DRG: 101 | Disposition: A | Discharge status: Home Health | Payer: Medicare Other | Source: Skilled Nursing Facility | Attending: Family Medicine | Admitting: Family Medicine

## 2020-06-22 ENCOUNTER — Emergency Department: Payer: Medicare Other

## 2020-06-22 ENCOUNTER — Encounter: Payer: Self-pay | Admitting: Internal Medicine

## 2020-06-22 DIAGNOSIS — R55 Syncope and collapse: Secondary | ICD-10-CM | POA: Diagnosis present

## 2020-06-22 DIAGNOSIS — R569 Unspecified convulsions: Principal | ICD-10-CM | POA: Diagnosis present

## 2020-06-22 DIAGNOSIS — I440 Atrioventricular block, first degree: Secondary | ICD-10-CM | POA: Diagnosis present

## 2020-06-22 DIAGNOSIS — F0390 Unspecified dementia without behavioral disturbance: Secondary | ICD-10-CM | POA: Diagnosis present

## 2020-06-22 DIAGNOSIS — Z888 Allergy status to other drugs, medicaments and biological substances status: Secondary | ICD-10-CM

## 2020-06-22 DIAGNOSIS — R4020 Unspecified coma: Secondary | ICD-10-CM | POA: Diagnosis present

## 2020-06-22 DIAGNOSIS — I2510 Atherosclerotic heart disease of native coronary artery without angina pectoris: Secondary | ICD-10-CM | POA: Diagnosis present

## 2020-06-22 DIAGNOSIS — Z79899 Other long term (current) drug therapy: Secondary | ICD-10-CM | POA: Diagnosis not present

## 2020-06-22 DIAGNOSIS — N390 Urinary tract infection, site not specified: Secondary | ICD-10-CM | POA: Diagnosis present

## 2020-06-22 DIAGNOSIS — I69354 Hemiplegia and hemiparesis following cerebral infarction affecting left non-dominant side: Secondary | ICD-10-CM

## 2020-06-22 DIAGNOSIS — Z20822 Contact with and (suspected) exposure to covid-19: Secondary | ICD-10-CM | POA: Diagnosis present

## 2020-06-22 DIAGNOSIS — F17210 Nicotine dependence, cigarettes, uncomplicated: Secondary | ICD-10-CM | POA: Diagnosis present

## 2020-06-22 DIAGNOSIS — Z87898 Personal history of other specified conditions: Secondary | ICD-10-CM

## 2020-06-22 DIAGNOSIS — Z881 Allergy status to other antibiotic agents status: Secondary | ICD-10-CM | POA: Diagnosis not present

## 2020-06-22 DIAGNOSIS — E785 Hyperlipidemia, unspecified: Secondary | ICD-10-CM | POA: Diagnosis present

## 2020-06-22 DIAGNOSIS — I10 Essential (primary) hypertension: Secondary | ICD-10-CM | POA: Diagnosis present

## 2020-06-22 DIAGNOSIS — R4189 Other symptoms and signs involving cognitive functions and awareness: Secondary | ICD-10-CM | POA: Diagnosis not present

## 2020-06-22 DIAGNOSIS — Z7982 Long term (current) use of aspirin: Secondary | ICD-10-CM | POA: Diagnosis not present

## 2020-06-22 DIAGNOSIS — F0150 Vascular dementia without behavioral disturbance: Secondary | ICD-10-CM | POA: Diagnosis not present

## 2020-06-22 LAB — BASIC METABOLIC PANEL
Anion gap: 13 (ref 5–15)
BUN: 20 mg/dL (ref 8–23)
CO2: 22 mmol/L (ref 22–32)
Calcium: 9.1 mg/dL (ref 8.9–10.3)
Chloride: 103 mmol/L (ref 98–111)
Creatinine, Ser: 1.04 mg/dL (ref 0.61–1.24)
GFR calc Af Amer: >60 mL/min (ref >60)
GFR calc non Af Amer: >60 mL/min (ref >60)
Glucose, Bld: 82 mg/dL (ref 70–99)
Potassium: 3.9 mmol/L (ref 3.5–5.1)
Sodium: 138 mmol/L (ref 135–145)

## 2020-06-22 LAB — CBC
HCT: 30 % — ABNORMAL LOW (ref 39.0–52.0)
HCT: 33.5 % — ABNORMAL LOW (ref 39.0–52.0)
Hemoglobin: 10.1 g/dL — ABNORMAL LOW (ref 13.0–17.0)
Hemoglobin: 10.9 g/dL — ABNORMAL LOW (ref 13.0–17.0)
MCH: 27.7 pg (ref 26.0–34.0)
MCH: 28 pg (ref 26.0–34.0)
MCHC: 32.5 g/dL (ref 30.0–36.0)
MCHC: 33.7 g/dL (ref 30.0–36.0)
MCV: 83.1 fL (ref 80.0–100.0)
MCV: 85.2 fL (ref 80.0–100.0)
Platelets: 216 10*3/uL (ref 150–400)
Platelets: 252 10*3/uL (ref 150–400)
RBC: 3.61 MIL/uL — ABNORMAL LOW (ref 4.22–5.81)
RBC: 3.93 MIL/uL — ABNORMAL LOW (ref 4.22–5.81)
RDW: 15.4 % (ref 11.5–15.5)
RDW: 15.7 % — ABNORMAL HIGH (ref 11.5–15.5)
WBC: 5.2 10*3/uL (ref 4.0–10.5)
WBC: 5.4 10*3/uL (ref 4.0–10.5)
nRBC: 0 % (ref 0.0–0.2)
nRBC: 0 % (ref 0.0–0.2)

## 2020-06-22 LAB — URINALYSIS, COMPLETE (UACMP) WITH MICROSCOPIC
Bilirubin Urine: NEGATIVE
Glucose, UA: NEGATIVE mg/dL
Ketones, ur: 5 mg/dL — AB
Nitrite: NEGATIVE
Protein, ur: 100 mg/dL — AB
RBC / HPF: >50 RBC/hpf — ABNORMAL HIGH (ref 0–5)
Specific Gravity, Urine: 1.016 (ref 1.005–1.030)
WBC, UA: >50 WBC/hpf — ABNORMAL HIGH (ref 0–5)
pH: 7 (ref 5.0–8.0)

## 2020-06-22 LAB — TROPONIN I (HIGH SENSITIVITY): Troponin I (High Sensitivity): 3 ng/L (ref <18)

## 2020-06-22 LAB — SARS CORONAVIRUS 2 BY RT PCR (HOSPITAL ORDER, PERFORMED IN ~~LOC~~ HOSPITAL LAB): SARS Coronavirus 2: NEGATIVE

## 2020-06-22 MED ORDER — LORAZEPAM 2 MG/ML IJ SOLN: 1.0000 mg | INTRAMUSCULAR | Status: DC | PRN

## 2020-06-22 MED ORDER — BISMUTH SUBSALICYLATE 262 MG/15ML PO SUSP
15.0000 mL | ORAL | Status: DC | PRN
  Filled 2020-06-22: qty 118

## 2020-06-22 MED ORDER — ATORVASTATIN CALCIUM 20 MG PO TABS
40.0000 mg | ORAL_TABLET | Freq: Every day | ORAL | Status: DC
  Administered 2020-06-23 – 2020-06-25 (×3): 40 mg via ORAL
  Filled 2020-06-22 (×3): qty 2

## 2020-06-22 MED ORDER — ENOXAPARIN SODIUM 40 MG/0.4ML ~~LOC~~ SOLN
40.0000 mg | SUBCUTANEOUS | Status: DC
  Administered 2020-06-23 – 2020-06-25 (×3): 40 mg via SUBCUTANEOUS
  Filled 2020-06-22 (×3): qty 0.4

## 2020-06-22 MED ORDER — METOPROLOL SUCCINATE ER 25 MG PO TB24
12.5000 mg | ORAL_TABLET | Freq: Every day | ORAL | Status: DC
  Administered 2020-06-23 – 2020-06-25 (×3): 12.5 mg via ORAL
  Filled 2020-06-22: qty 0.5
  Filled 2020-06-22: qty 1
  Filled 2020-06-22: qty 0.5
  Filled 2020-06-22: qty 1

## 2020-06-22 MED ORDER — ADULT MULTIVITAMIN W/MINERALS CH
1.0000 | ORAL_TABLET | Freq: Every day | ORAL | Status: DC
  Administered 2020-06-23 – 2020-06-25 (×3): 1 via ORAL
  Filled 2020-06-22 (×3): qty 1

## 2020-06-22 MED ORDER — ZINC OXIDE 40 % EX OINT
1.0000 "application " | TOPICAL_OINTMENT | Freq: Two times a day (BID) | CUTANEOUS | Status: DC
  Administered 2020-06-23 – 2020-06-25 (×5): 1 via TOPICAL
  Filled 2020-06-22: qty 113

## 2020-06-22 MED ORDER — NITROGLYCERIN 0.4 MG SL SUBL: 0.4000 mg | SUBLINGUAL_TABLET | SUBLINGUAL | Status: DC | PRN

## 2020-06-22 MED ORDER — BRIMONIDINE TARTRATE 0.2 % OP SOLN
2.0000 [drp] | Freq: Two times a day (BID) | OPHTHALMIC | Status: DC
  Administered 2020-06-23 – 2020-06-25 (×5): 2 [drp] via OPHTHALMIC
  Filled 2020-06-22: qty 5

## 2020-06-22 MED ORDER — ALUM & MAG HYDROXIDE-SIMETH 200-200-20 MG/5ML PO SUSP: 30.0000 mL | Freq: Once | ORAL | Status: DC | PRN

## 2020-06-22 MED ORDER — ACETAMINOPHEN 325 MG PO TABS
650.0000 mg | ORAL_TABLET | ORAL | Status: DC | PRN
  Administered 2020-06-23: 650 mg via ORAL
  Filled 2020-06-22: qty 2

## 2020-06-22 MED ORDER — SODIUM CHLORIDE 0.9 % IV SOLN
1.0000 g | Freq: Once | INTRAVENOUS | Status: AC
  Administered 2020-06-22: 1 g via INTRAVENOUS
  Filled 2020-06-22: qty 10

## 2020-06-22 MED ORDER — LATANOPROST 0.005 % OP SOLN
1.0000 [drp] | Freq: Every day | OPHTHALMIC | Status: DC
  Administered 2020-06-24 (×2): 1 [drp] via OPHTHALMIC
  Filled 2020-06-22 (×2): qty 2.5

## 2020-06-22 MED ORDER — ASPIRIN 81 MG PO CHEW
81.0000 mg | CHEWABLE_TABLET | Freq: Every morning | ORAL | Status: DC
  Administered 2020-06-23 – 2020-06-25 (×2): 81 mg via ORAL
  Filled 2020-06-22 (×2): qty 1

## 2020-06-22 MED ORDER — WHITE PETROLATUM GEL
1.0000 "application " | Status: DC | PRN
  Filled 2020-06-22: qty 71

## 2020-06-22 MED ORDER — COLLOIDAL OATMEAL 1 % EX CREA: 1.0000 "application " | TOPICAL_CREAM | CUTANEOUS | Status: DC

## 2020-06-22 MED ORDER — MAGNESIUM HYDROXIDE 400 MG/5ML PO SUSP
30.0000 mL | Freq: Once | ORAL | Status: DC | PRN
  Filled 2020-06-22: qty 30

## 2020-06-22 MED ORDER — KCL IN DEXTROSE-NACL 20-5-0.45 MEQ/L-%-% IV SOLN
INTRAVENOUS | Status: AC
  Filled 2020-06-22 (×2): qty 1000

## 2020-06-22 NOTE — ED Notes (Signed)
IV team unable to obtain access. Dr. Larinda Buttery aware.

## 2020-06-22 NOTE — ED Notes (Signed)
IV does not flush or pull back. Dr. Larinda Buttery aware.

## 2020-06-22 NOTE — ED Notes (Addendum)
Patient is able to answer simple questions appropriately, but cannot follow extensive conversation. Patient has a strong odor of urine. Patient states he is unable to provide urine specimen at this time due to just voiding. Patient has on brief. Seizure pads are in place.

## 2020-06-22 NOTE — ED Notes (Signed)
IV team at bedside 

## 2020-06-22 NOTE — ED Triage Notes (Signed)
Pt arrives via ems from [facility] on [address]. Pt has a hx of seizures and was standing outside wearing a sweater and pants  Smoking a cigarette. No injury noted to his tongue

## 2020-06-22 NOTE — ED Provider Notes (Addendum)
[facility] Emergency Department Provider Note   ____________________________________________   First MD Initiated Contact with Patient 06/22/20 1846     (approximate)  I have reviewed the triage vital signs and the nursing notes.   HISTORY  Chief Complaint Seizures    HPI Kholton Coate is a 86 y.o. male with past medical history of CAD, hypertension, stroke, and dementia who presents to the ED for unresponsive episode.  History is limited due to patient's baseline dementia.  Patient states he does not remember what happened earlier today or why he is here in the ED.  Per EMS, he had gone outside in his wheelchair at his group home and was subsequently found slumped over and unresponsive in the wheelchair.  The episode lasted for a few minutes and the patient appeared disoriented afterwards.  He is now awake and alert and denies any complaints, states "I feel fine."  Speaking with his caregiver at the group home, he had a similar episode last month and was admitted to [facility].  At that time he had unremarkable stroke work-up as well as EEG, but it remained unclear whether episode was related to syncope versus seizure.  Caretaker reports the episode today was more severe in terms of duration and patient's level of responsiveness.  Caretaker also reports patient has left-sided weakness at baseline.        Past Medical History:  Diagnosis Date  . Coronary artery disease   . Dementia (HCC)   . Hypertension   . Thrombocytopenia [facility])     Patient Active Problem List   Diagnosis Date Noted  . LOC (loss of consciousness) (HCC) 06/22/2020  . Seizure (HCC) 06/22/2020  . Episode of unresponsiveness   . Acute cystitis with hematuria   . Syncope 05/07/2020  . Dehydration 11/28/2019  . Altered mental status 11/27/2019  . Jaw pain   . CVA (cerebral vascular accident) (HCC) 04/17/2019  . Ischemic stroke (HCC) 07/15/2018  . Hypertension 07/15/2018  . Dementia  (HCC) 07/15/2018  . Coronary artery disease 07/15/2018    History reviewed. No pertinent surgical history.  Prior to Admission medications   Medication Sig Start Date End Date Taking? Authorizing Provider  acetaminophen (TYLENOL) 325 MG tablet Take 2 tablets (650 mg total) by mouth every 4 (four) hours as needed for mild pain (or temp > 37.5 C (99.5 F)). Patient taking differently: Take 650 mg by mouth See admin instructions. Take 650 mg by mouth three times a day and an additional 650 mg as needed for fever, headache, or discomfort 07/18/18  Yes Elgergawy, Leana Roe, MD  alum & mag hydroxide-simeth (MYLANTA) 200-200-20 MG/5ML suspension Take 30 mLs by mouth once as needed for indigestion or heartburn.   Yes [provider]  Amino Acids-Protein Hydrolys (FEEDING SUPPLEMENT, PRO-STAT SUGAR FREE 64,) LIQD Take 30 mLs by mouth daily. CHERRY   Yes [provider]  aspirin 81 MG chewable tablet Chew 81 mg by mouth in the morning.   Yes [provider]  atorvastatin (LIPITOR) 40 MG tablet Take 1 tablet (40 mg total) by mouth daily at 6 PM. Patient taking differently: Take 40 mg by mouth daily.  07/18/18  Yes Elgergawy, Leana Roe, MD  baclofen (LIORESAL) 10 MG tablet Take 10 mg by mouth 3 (three) times daily.   Yes [provider]  bismuth subsalicylate (PEPTO-BISMOL) 262 MG/15ML suspension Take 15 mLs by mouth every 2 (two) hours as needed for indigestion or diarrhea or loose stools (max of  6 doses in 24 hours).   Yes [provider]  brimonidine (ALPHAGAN) 0.2 % ophthalmic solution Place 2 drops into both eyes 2 (two) times daily.   Yes [provider]  Colloidal Oatmeal (AVEENO ECZEMA THERAPY) 1 % CREA Apply 1 application topically See admin instructions. Apply to affected areas 2 times a day   Yes [provider]  hydrocortisone cream 1 % Apply 1 application topically 2 (two) times daily.   Yes [provider]  lactose free  nutrition (BOOST) LIQD Take 237 mLs by mouth 3 (three) times daily. VANILLA   Yes [provider]  loperamide (IMODIUM A-D) 2 MG tablet Take 2 mg by mouth 4 (four) times daily as needed (after each loose stool- up to 4 doses in 12 hours and call MD if this diarrhea persists longer than 12 hours or is accompanied by severe abdominal pain).   Yes [provider]  magnesium hydroxide (MILK OF MAGNESIA) 400 MG/5ML suspension Take 30 mLs by mouth once as needed for mild constipation.   Yes [provider]  metoprolol succinate (TOPROL-XL) 25 MG 24 hr tablet Take 12.5 mg by mouth daily.    Yes [provider]  Multiple Vitamins-Minerals (MULTIVITAMINS THER. W/MINERALS) TABS tablet Take 1 tablet by mouth daily.   Yes [provider]  nitroGLYCERIN (NITROSTAT) 0.4 MG SL tablet Place 0.4 mg under the tongue every 5 (five) minutes x 3 doses as needed for chest pain (AND CALL EMS IF NO RESOLUTION).   Yes [provider]  Phenylephrine-DM-GG (ROBITUSSIN COUGH/COLD CF PO) Take 10 mLs by mouth every 6 (six) hours as needed (for cough).   Yes [provider]  senna-docusate (SENOKOT-S) 8.6-50 MG tablet Take 1 tablet by mouth at bedtime as needed for mild constipation. 04/19/19  Yes Gouru, Deanna Artis, MD  Sodium Phosphates (FLEET ENEMA RE) Place 1 enema rectally once as needed (for constipation not relieved by prune juice and milk of magnesia protocol and call MD if no relief from enema).   Yes [provider]  Travoprost, BAK Free, (TRAVATAN) 0.004 % SOLN ophthalmic solution Place 1 drop into both eyes at bedtime.   Yes [provider]  white petrolatum (VASELINE) GEL Apply 1 application topically as needed (to affected areas).    Yes [provider]  zinc oxide 20 % ointment Apply 1 application topically See admin instructions. Apply to affected areas 2 times a day and to the buttocks daily as needed for rash   Yes [provider]    Allergies Cephalexin, Ppd [tuberculin purified protein derivative], and Tuberculin tests  History reviewed. No pertinent family history.  Social History Social History   Tobacco Use  . Smoking status: Current Every Day Smoker  . Smokeless tobacco: Never Used  Substance Use Topics  . Alcohol use: No  . Drug use: No    Review of Systems Unable to obtain secondary to dementia  ____________________________________________   PHYSICAL EXAM:  VITAL SIGNS: ED Triage Vitals  Enc Vitals Group     BP 06/22/20 0950 114/65     Pulse Rate 06/22/20 0950 86     Resp 06/22/20 0950 20     Temp 06/22/20 0950 98.6 F (37 C)     Temp Source 06/22/20 0950 Oral     SpO2 06/22/20 0950 98 %     Weight 06/22/20 1000 200 lb (90.7 kg)     Height 06/22/20 1000 6\' 2"  (1.88 m)     Head  Circumference --      Peak Flow --      Pain Score 06/22/20 1007 0     Pain Loc --      Pain Edu? --      Excl. in GC? --     Constitutional: Alert and oriented to person, but not place or time. Eyes: Conjunctivae are normal. Head: Atraumatic. Nose: No congestion/rhinnorhea. Mouth/Throat: Mucous membranes are moist. Neck: Normal ROM Cardiovascular: Normal rate, regular rhythm. Grossly normal heart sounds. Respiratory: Normal respiratory effort.  No retractions. Lungs CTAB. Gastrointestinal: Soft and nontender. No distention. Genitourinary: deferred Musculoskeletal: No lower extremity tenderness nor edema. Neurologic:  Normal speech and language.  Spastic weakness in left upper extremity and left lower extremity, strength intact to right upper and right lower extremities. Skin:  Skin is warm, dry and intact. No rash noted. Psychiatric: Mood and affect are normal. Speech and behavior are normal.  ____________________________________________   LABS (all labs ordered are listed, but only abnormal results are displayed)  Labs Reviewed  CBC - Abnormal; Notable for the following components:       Result Value   RBC 3.93 (*)    Hemoglobin 10.9 (*)    HCT 33.5 (*)    RDW 15.7 (*)    All other components within normal limits  URINALYSIS, COMPLETE (UACMP) WITH MICROSCOPIC - Abnormal; Notable for the following components:   Color, Urine YELLOW (*)    APPearance TURBID (*)    Hgb urine dipstick MODERATE (*)    Ketones, ur 5 (*)    Protein, ur 100 (*)    Leukocytes,Ua TRACE (*)    RBC / HPF >50 (*)    WBC, UA >50 (*)    Bacteria, UA RARE (*)    All other components within normal limits  SARS CORONAVIRUS 2 BY RT PCR (HOSPITAL ORDER, PERFORMED IN [facility])  URINE CULTURE  BASIC METABOLIC PANEL  CBC  CREATININE, SERUM  CBG MONITORING, ED  TROPONIN I (HIGH SENSITIVITY)   ____________________________________________  EKG  ED ECG REPORT I, Chesley Noonharles Kishana Battey, the attending physician, personally viewed and interpreted this ECG.   Date: 06/22/2020  EKG Time: 10:17  Rate: 81  Rhythm: normal sinus rhythm  Axis: Normal  Intervals:first-degree A-V block   ST&T Change: None   PROCEDURES  Procedure(s) performed (including Critical Care):  Procedures   ____________________________________________   INITIAL IMPRESSION / ASSESSMENT AND PLAN / ED COURSE      86 year old male with past medical history of CAD, hypertension, stroke with left-sided deficits, and dementia who presents to the ED following episode of unresponsiveness at his group home.  He was reportedly unarousable for multiple minutes, unclear if there was any seizure activity.  Patient does appear back to his baseline at this time with left-sided weakness that is chronic per caretaker.  CT head and C-spine were negative for acute process, EKG and labs are unremarkable.  UA is pending at this time, but given recurrent episode of syncope versus seizure with seems to be progressive, plan for admission.   UA shows chronic hematuria, however elevated WBCs also concerning for UTI.  Urine was sent for culture  and we will treat with Rocephin for now, chart reports allergy to Keflex but patient has previously tolerated ceftriaxone.  Case discussed with hospitalist for admission.      ____________________________________________   FINAL CLINICAL IMPRESSION(S) / ED DIAGNOSES  Final diagnoses:  Episode of unresponsiveness     ED Discharge Orders  None       Note:  This document was prepared using Dragon voice recognition software and may include unintentional dictation errors.   Chesley Noon, MD 06/22/20 2123    Chesley Noon, MD 06/22/20 [id number]

## 2020-06-22 NOTE — H&P (Addendum)
[facility] History and Physical  Ronald Matthews TDS:[id number] DOB: 12-31-1932 DOA: 06/22/2020  Referring physician: ED physician PCP: Galvin Proffer, MD  Specialists:   Chief Complaint: loss of consciousness  HPI: Ronald Matthews is a 86 y.o. male with PMH of hypertension, CAD, previous CVA with left-sided weakness, dementia presented with episode of loss of consciousness.  When I saw the patient, patient is alert but demented and cannot give reasonable history.  History majority from ED personnel and chart review. per EMS report, patient around  9am at his normal status, he went to have a cigarette, and suddenly patient lost consciousness, with some tremors and drooling.  EMS came, patient is altered, during the transportation, patient gradually come around and back to his baseline.  Per EMS, staff from group home states patient has history of seizure.  Currently patient reports no discomfort, pain, cough, vomiting, diarrhea.  ED discussed with patient's caregiver, looks like patient returned to his baseline mental status. Of note, patient has similar episode and was admitted on 7/06, work-up negative include EEG,  except possible UTI and the patient was treated with UTI  In ED, patient was found to have a 98.3, heart rate 77, respiratory 15, blood pressure 114/65-1 46/78, oxygen saturation 95% on room air.  Basically unremarkable BMP, high-sensitivity troponin III, WBC 5.2, hemoglobin 10.9 stable, baseline 10.2 05/08/2020, SARS-CoV-2 negative CT head and CT cervical spine: 1. No evidence of acute intracranial abnormality. Moderate to severe chronic small-vessel white matter ischemic changes and remote RIGHT pontine infarct. 2. No static evidence of acute injury to the cervical spine. Left elbow x-ray: No acute bony abnormality     EKG: Independently reviewed.  Sinus rhythm with first-degree AV block  Where does patient live?   At home   SNF    Assistant living facility   [facility] Can patient participate in ADLs?  Yes     Barely     None  Little     Some   Review of Systems:   General: no fevers, chills, no changes in body weight, has poor appetite, has fatigue HEENT: no blurry vision, hearing changes or sore throat Pulm: no dyspnea, coughing, wheezing CV: no chest pain, no palpitations Abd: no nausea, vomiting, abdominal pain, diarrhea, constipation GU: no dysuria, burning on urination, increased urinary frequency, hematuria  Ext: no leg edema Neuro: no unilateral weakness, numbness, or tingling, no vision change or hearing loss Skin: no rash MSK: No muscle spasm, no deformity, no limitation of range of movement in spin Heme: No easy bruising.  Travel history: No recent long distant travel.  Allergy:  Allergies  Allergen Reactions  . Cephalexin Other (See Comments)  . Ppd [Tuberculin Purified Protein Derivative] Other (See Comments)    "Allergic," per [facility]  . Tuberculin Tests Other (See Comments)    "Allergic," per [facility]    Past Medical History:  Diagnosis Date  . Coronary artery disease   . Dementia (HCC)   . Hypertension   . Thrombocytopenia (HCC)     No past surgical history on file.  Social History:  reports that he has been smoking. He has never used smokeless tobacco. He reports that he does not drink alcohol and does not use drugs.  Family History: No family history on file.   Prior to Admission medications   Medication Sig Start Date End Date Taking? Authorizing Provider  acetaminophen (TYLENOL) 325 MG tablet Take 2 tablets (650 mg total) by mouth every 4 (four) hours as  needed for mild pain (or temp > 37.5 C (99.5 F)). Patient taking differently: Take 650 mg by mouth See admin instructions. Take 650 mg by mouth three times a day and an additional 650 mg as needed for fever, headache, or discomfort 07/18/18   Elgergawy, Leana Roe, MD  alum & mag hydroxide-simeth (MYLANTA) 200-200-20 MG/5ML suspension Take 30 mLs by mouth once as needed  for indigestion or heartburn.    [provider]  Amino Acids-Protein Hydrolys (FEEDING SUPPLEMENT, PRO-STAT SUGAR FREE 64,) LIQD Take 30 mLs by mouth daily. CHERRY    [provider]  aspirin 81 MG chewable tablet Chew 81 mg by mouth in the morning.    [provider]  atorvastatin (LIPITOR) 40 MG tablet Take 1 tablet (40 mg total) by mouth daily at 6 PM. Patient taking differently: Take 40 mg by mouth daily.  07/18/18   Elgergawy, Leana Roe, MD  baclofen (LIORESAL) 10 MG tablet Take 10 mg by mouth 3 (three) times daily.    [provider]  bismuth subsalicylate (PEPTO-BISMOL) 262 MG/15ML suspension Take 15 mLs by mouth every 2 (two) hours as needed for indigestion or diarrhea or loose stools (max of 6 doses in 24 hours).    [provider]  brimonidine (ALPHAGAN) 0.2 % ophthalmic solution Place 2 drops into both eyes 2 (two) times daily.    [provider]  Colloidal Oatmeal (AVEENO ECZEMA THERAPY) 1 % CREA Apply 1 application topically See admin instructions. Apply to affected areas 2 times a day    [provider]  hydrocortisone cream 1 % Apply 1 application topically 2 (two) times daily.    [provider]  lactose free nutrition (BOOST) LIQD Take 237 mLs by mouth 3 (three) times daily. VANILLA    [provider]  loperamide (IMODIUM A-D) 2 MG tablet Take 2 mg by mouth 4 (four) times daily as needed (after each loose stool- up to 4 doses in 12 hours and call MD if this diarrhea persists longer than 12 hours or is accompanied by severe abdominal pain).    [provider]  magnesium hydroxide (MILK OF MAGNESIA) 400 MG/5ML suspension Take 30 mLs by mouth once as needed for mild constipation.    [provider]  metoprolol succinate (TOPROL-XL) 25 MG 24 hr tablet Take 12.5 mg by mouth daily.     [provider]  Multiple Vitamins-Minerals (MULTIVITAMINS THER. W/MINERALS) TABS tablet Take 1 tablet  by mouth daily.    [provider]  nitroGLYCERIN (NITROSTAT) 0.4 MG SL tablet Place 0.4 mg under the tongue every 5 (five) minutes x 3 doses as needed for chest pain (AND CALL EMS IF NO RESOLUTION).    [provider]  Phenylephrine-DM-GG (ROBITUSSIN COUGH/COLD CF PO) Take 10 mLs by mouth every 6 (six) hours as needed (for cough).    [provider]  senna-docusate (SENOKOT-S) 8.6-50 MG tablet Take 1 tablet by mouth at bedtime as needed for mild constipation. 04/19/19   Ramonita Lab, MD  Sodium Phosphates (FLEET ENEMA RE) Place 1 enema rectally once as needed (for constipation not relieved by prune juice and milk of magnesia protocol and call MD if no relief from enema).    [provider]  sulfamethoxazole-trimethoprim (BACTRIM DS) 800-160 MG tablet Take 1 tablet by mouth every 12 (twelve) hours. 05/08/20   Claudean Severance, MD  Travoprost, BAK Free, (TRAVATAN) 0.004 % SOLN ophthalmic solution Place 1 drop into both eyes at bedtime.  [provider]  white petrolatum (VASELINE) GEL Apply 1 application topically as needed (to affected areas).     [provider]  zinc oxide 20 % ointment Apply 1 application topically See admin instructions. Apply to affected areas 2 times a day and to the buttocks daily as needed for rash    [provider]    Physical Exam: Vitals:   06/22/20 1000 06/22/20 1742 06/22/20 1914 06/22/20 2015  BP:  129/60 (!) 146/78   Pulse:  68 77 77  Resp:  18 14 15   Temp:  98.3 F (36.8 C)    TempSrc:  Oral    SpO2:  98% 98% 99%  Weight: 90.7 kg     Height: 6\' 2"  (1.88 m)      General: Not in acute distress HEENT:       Eyes: PERRL, EOMI, no scleral icterus.       ENT: No discharge from the ears and nose, no pharynx injection, no tonsillar enlargement.        Neck: No JVD, no bruit, no mass felt. Heme: No neck lymph node enlargement. Cardiac: S1/S2, RRR, No murmurs, No gallops or rubs. Pulm: Good air  movement bilaterally. No rales, wheezing, rhonchi or rubs. Abd: Soft, nondistended, nontender, no rebound pain, no organomegaly, BS present. Ext: No pitting leg edema bilaterally. 2+DP/PT pulse bilaterally. Musculoskeletal: No joint deformities, No joint redness or warmth, no limitation of ROM in spin. Skin: No rashes.  Neuro: Alert, oriented X3, cranial nerves II-XII grossly intact, moves all extremities normally. Muscle strength 5/5 in all extremities, sensation to light touch intact. Brachial reflex 2+ bilaterally. Knee reflex 1+ bilaterally. Negative Babinski's sign. Normal finger to nose test. Psych: Patient is not psychotic, no suicidal or hemocidal ideation.  Labs on Admission:  Basic Metabolic Panel: Recent Labs  Lab 06/22/20 1000  NA 138  K 3.9  CL 103  CO2 22  GLUCOSE 82  BUN 20  CREATININE 1.04  CALCIUM 9.1   Liver Function Tests: No results for input(s): AST, ALT, ALKPHOS, BILITOT, PROT, ALBUMIN in the last 168 hours. No results for input(s): LIPASE, AMYLASE in the last 168 hours. No results for input(s): AMMONIA in the last 168 hours. CBC: Recent Labs  Lab 06/22/20 1000  WBC 5.2  HGB 10.9*  HCT 33.5*  MCV 85.2  PLT 252   Cardiac Enzymes: No results for input(s): CKTOTAL, CKMB, CKMBINDEX, TROPONINI in the last 168 hours.  BNP (last 3 results) No results for input(s): BNP in the last 8760 hours.  ProBNP (last 3 results) No results for input(s): PROBNP in the last 8760 hours.  CBG: No results for input(s): GLUCAP in the last 168 hours.  Radiological Exams on Admission: DG Elbow 2 Views Left  Result Date: 06/22/2020 CLINICAL DATA:  Fall EXAM: LEFT ELBOW - 2 VIEW COMPARISON:  04/17/2019 FINDINGS: Three view radiograph of the left elbow is slightly limited by suboptimal positioning. Despite this, there is normal alignment. No fracture or dislocation. No effusion. Subcutaneous calcific densities are again seen within the medial soft tissues IMPRESSION: No  acute bony abnormality. Electronically Signed   By: 06/24/2020 MD   On: 06/22/2020 19:49   CT Head Wo Contrast  Result Date: 06/22/2020 CLINICAL DATA:  86 year old male with seizure and possible head and neck injury. EXAM: CT HEAD WITHOUT CONTRAST CT CERVICAL SPINE WITHOUT CONTRAST TECHNIQUE: Multidetector CT imaging of the head and cervical spine was performed following the standard protocol without intravenous contrast. Multiplanar  CT image reconstructions of the cervical spine were also generated. COMPARISON:  05/07/2020 head CT and prior studies FINDINGS: CT HEAD FINDINGS Brain: No evidence of acute infarction, hemorrhage, hydrocephalus, extra-axial collection or mass lesion/mass effect. Moderate to severe chronic small-vessel white matter ischemic changes and remote RIGHT pontine infarct again noted. Vascular: Carotid atherosclerotic calcifications are noted. Skull: Normal. Negative for fracture or focal lesion. Sinuses/Orbits: No acute finding. Other: None. CT CERVICAL SPINE FINDINGS Alignment: Normal. Skull base and vertebrae: No acute fracture. No primary bone lesion or focal pathologic process. Soft tissues and spinal canal: No prevertebral fluid or swelling. No visible canal hematoma. Disc levels: Multilevel degenerative disc disease and facet arthropathy again noted. Degenerative disc disease/spondylosis is greatest at C3-4. Upper chest: No acute abnormality Other: None IMPRESSION: 1. No evidence of acute intracranial abnormality. Moderate to severe chronic small-vessel white matter ischemic changes and remote RIGHT pontine infarct. 2. No static evidence of acute injury to the cervical spine. Electronically Signed   By: Harmon Pier M.D.   On: 06/22/2020 17:13   CT Cervical Spine Wo Contrast  Result Date: 06/22/2020 CLINICAL DATA:  86 year old male with seizure and possible head and neck injury. EXAM: CT HEAD WITHOUT CONTRAST CT CERVICAL SPINE WITHOUT CONTRAST TECHNIQUE: Multidetector CT  imaging of the head and cervical spine was performed following the standard protocol without intravenous contrast. Multiplanar CT image reconstructions of the cervical spine were also generated. COMPARISON:  05/07/2020 head CT and prior studies FINDINGS: CT HEAD FINDINGS Brain: No evidence of acute infarction, hemorrhage, hydrocephalus, extra-axial collection or mass lesion/mass effect. Moderate to severe chronic small-vessel white matter ischemic changes and remote RIGHT pontine infarct again noted. Vascular: Carotid atherosclerotic calcifications are noted. Skull: Normal. Negative for fracture or focal lesion. Sinuses/Orbits: No acute finding. Other: None. CT CERVICAL SPINE FINDINGS Alignment: Normal. Skull base and vertebrae: No acute fracture. No primary bone lesion or focal pathologic process. Soft tissues and spinal canal: No prevertebral fluid or swelling. No visible canal hematoma. Disc levels: Multilevel degenerative disc disease and facet arthropathy again noted. Degenerative disc disease/spondylosis is greatest at C3-4. Upper chest: No acute abnormality Other: None IMPRESSION: 1. No evidence of acute intracranial abnormality. Moderate to severe chronic small-vessel white matter ischemic changes and remote RIGHT pontine infarct. 2. No static evidence of acute injury to the cervical spine. Electronically Signed   By: Harmon Pier M.D.   On: 06/22/2020 17:13    Assessment/Plan Principal Problem:   LOC (loss of consciousness) (HCC) Active Problems:   Dementia (HCC)   Seizure (HCC)   Active Problems:   LOC (loss of consciousness) (HCC)  #Loss of consciousness: Etiology unclear.  Given witnessed seizure-like activity, possible postictal, history of CVA, most likely seizure.  However cannot rule out other etiology including UTI, TIA/CVA (less likely, giving no focal neuro deficit), and other -admit to tele bed  -Frequent neuro check -day team consult in-house neurology tomorrow when  available -defer neurology for EEG, MRI of brain  -Seizure and fall precaution -When necessary Ativan - UA pending  #PMH of hypertension, CAD, previous CVA with left-sided weakness, dementia presented with episode of loss of consciousness. Established Active problems see above Continue home medications if appropriate follow-up with his PCP, specialist when necessary  Addendum at 1:27 AM: UA suggests UTI, started on Rocephin the ED, will continue.  DVT ppx:         SQ Lovenox  Code Status: Full code Family Communication: None at bed side.  Disposition Plan: Admit to inpatient   Date of Service 06/22/2020    Rayne DuYunxiang Draylen Lobue [facility] Pager [phone]  If 7PM-7AM, please contact night-coverage [email or web address] Password [email or web address] 06/22/2020, 9:26 PM

## 2020-06-22 NOTE — ED Notes (Signed)
Two unsuccessful IV attempts. Will defer to IV team.

## 2020-06-22 NOTE — ED Notes (Signed)
Report given to Meagan RN.

## 2020-06-22 NOTE — ED Notes (Signed)
Patient given call bell

## 2020-06-23 DIAGNOSIS — R569 Unspecified convulsions: Principal | ICD-10-CM

## 2020-06-23 DIAGNOSIS — R4020 Unspecified coma: Secondary | ICD-10-CM

## 2020-06-23 LAB — CREATININE, SERUM
Creatinine, Ser: 0.84 mg/dL (ref 0.61–1.24)
GFR calc Af Amer: >60 mL/min (ref >60)
GFR calc non Af Amer: >60 mL/min (ref >60)

## 2020-06-23 MED ORDER — SODIUM CHLORIDE 0.9 % IV SOLN
1.0000 g | INTRAVENOUS | Status: DC
  Administered 2020-06-23 – 2020-06-24 (×2): 1 g via INTRAVENOUS
  Filled 2020-06-23: qty 10
  Filled 2020-06-23: qty 1
  Filled 2020-06-23: qty 10

## 2020-06-23 MED ORDER — HYDROCERIN EX CREA
TOPICAL_CREAM | Freq: Two times a day (BID) | CUTANEOUS | Status: DC
  Administered 2020-06-24: 1 via TOPICAL
  Filled 2020-06-23 (×2): qty 113

## 2020-06-23 MED ORDER — LEVETIRACETAM 500 MG PO TABS
500.0000 mg | ORAL_TABLET | Freq: Two times a day (BID) | ORAL | Status: DC
  Administered 2020-06-23 – 2020-06-25 (×5): 500 mg via ORAL
  Filled 2020-06-23 (×6): qty 1

## 2020-06-23 NOTE — Progress Notes (Signed)
[facility]  PROGRESS NOTE  Ronald Matthews BPZ:[id number] DOB: 1933/06/09 DOA: 06/22/2020 PCP: Galvin Proffer, MD   Brief HPI:   86 year old male with medical history of hypertension, CAD, previous CVA with left-sided weakness, dementia presents with episode of loss of consciousness.  Patient has history of seizures in the past had negative work-up including EEG in July 2021.  Patient also had a normal UA so started on IV ceftriaxone.  Urine culture obtained.    Subjective   Patient seen and examined, denies any complaints.  Is alert oriented x 2.   Assessment/Plan:     1. Loss of consciousness-resolved, likely postictal from seizure.  EEG done in July 2021 was unremarkable.  Will consult neurology for further recommendations.  He is not on antiepileptic medications. 2. ?  UTI-patient has a normal UA, started on IV ceftriaxone.  Follow urine culture results. 3. Hyperlipidemia-continue atorvastatin. 4. Hypertension-continue metoprolol. 5. History of CVA with left-sided weakness-continue aspirin, atorvastatin. 6. History of CAD-continue aspirin, atorvastatin, metoprolol.     COVID-19 Labs  No results for input(s): DDIMER, FERRITIN, LDH, CRP in the last 72 hours.  Lab Results  Component Value Date   SARSCOV2NAA NEGATIVE 06/22/2020   SARSCOV2NAA NEGATIVE 11/28/2019   SARSCOV2NAA NEGATIVE 04/17/2019     Scheduled medications:   . aspirin  81 mg Oral q AM  . atorvastatin  40 mg Oral Daily  . brimonidine  2 drop Both Eyes BID  . Colloidal Oatmeal  1 application Apply externally See admin instructions  . enoxaparin (LOVENOX) injection  40 mg Subcutaneous Q24H  . latanoprost  1 drop Both Eyes QHS  . liver oil-zinc oxide  1 application Topical BID  . metoprolol succinate  12.5 mg Oral Daily  . multivitamin with minerals  1 tablet Oral Daily         CBG: No results for input(s): GLUCAP in the last 168 hours.  SpO2: 99 %    CBC: Recent Labs  Lab 06/22/20 1000  06/22/20 2338  WBC 5.2 5.4  HGB 10.9* 10.1*  HCT 33.5* 30.0*  MCV 85.2 83.1  PLT 252 216    Basic Metabolic Panel: Recent Labs  Lab 06/22/20 1000 06/22/20 2338  NA 138  --   K 3.9  --   CL 103  --   CO2 22  --   GLUCOSE 82  --   BUN 20  --   CREATININE 1.04 0.84  CALCIUM 9.1  --      Liver Function Tests: No results for input(s): AST, ALT, ALKPHOS, BILITOT, PROT, ALBUMIN in the last 168 hours.   Antibiotics: Anti-infectives (From admission, onward)   Start     Dose/Rate Route Frequency Ordered Stop   06/23/20 2000  cefTRIAXone (ROCEPHIN) 1 g in sodium chloride 0.9 % 100 mL IVPB        1 g 200 mL/hr over 30 Minutes Intravenous Every 24 hours 06/23/20 0127     06/22/20 2245  cefTRIAXone (ROCEPHIN) 1 g in sodium chloride 0.9 % 100 mL IVPB        1 g 200 mL/hr over 30 Minutes Intravenous  Once 06/22/20 2234 06/23/20 0003       DVT prophylaxis: Lovenox  Code Status: Full code  Family Communication: No family at bedside    Status is: Inpatient  Dispo: The patient is from: Home              Anticipated d/c is to: Home  Anticipated d/c date is: 06/26/2020              Patient currently not medically stable for discharge  Barrier to discharge-altered mental status/?  Seizure          Consultants:    Procedures:     Objective   Vitals:   06/23/20 0530 06/23/20 0630 06/23/20 0700 06/23/20 0730  BP: 133/70 129/76 134/86 124/66  Pulse: 98 85 (!) 119 71  Resp: 13 15 (!) 21 13  Temp:      TempSrc:      SpO2: 97% 95% 100% 99%  Weight:      Height:       No intake or output data in the 24 hours ending 06/23/20 0855  No intake/output data recorded.  Filed Weights   06/22/20 1000  Weight: 90.7 kg    Physical Examination:    General: Appears in no acute distress  Cardiovascular: S1-S2, regular, no murmur auscultated  Respiratory: Clear to auscultation bilaterally, no wheeze no crackles auscultated  Abdomen: Abdomen is  soft, nontender, no organomegaly  Extremities: No edema in the lower extremities  Neurologic: Alert, oriented to self and year    Data Reviewed:   Recent Results (from the past 240 hour(s))  SARS Coronavirus 2 by RT PCR (hospital order, performed in [facility] hospital lab) Nasopharyngeal Nasopharyngeal Swab     Status: None   Collection Time: 06/22/20  7:34 PM   Specimen: Nasopharyngeal Swab  Result Value Ref Range Status   SARS Coronavirus 2 NEGATIVE NEGATIVE Final    Comment: (NOTE) SARS-CoV-2 target nucleic acids are NOT DETECTED.  The SARS-CoV-2 RNA is generally detectable in upper and lower respiratory specimens during the acute phase of infection. The lowest concentration of SARS-CoV-2 viral copies this assay can detect is 250 copies / mL. A negative result does not preclude SARS-CoV-2 infection and should not be used as the sole basis for treatment or other patient management decisions.  A negative result may occur with improper specimen collection / handling, submission of specimen other than nasopharyngeal swab, presence of viral mutation(s) within the areas targeted by this assay, and inadequate number of viral copies (<250 copies / mL). A negative result must be combined with clinical observations, patient history, and epidemiological information.  Fact Sheet for Patients:   BoilerBrush.com.cy  Fact Sheet for Healthcare Providers: https://pope.com/  This test is not yet approved or  cleared by the [place] FDA and has been authorized for detection and/or diagnosis of SARS-CoV-2 by FDA under an Emergency Use Authorization (EUA).  This EUA will remain in effect (meaning this test can be used) for the duration of the COVID-19 declaration under Section 564(b)(1) of the Act, 21 U.S.C. section 360bbb-3(b)(1), unless the authorization is terminated or revoked sooner.  Performed at [facility], [address] [place], [place] [address]     No results for input(s): LIPASE, AMYLASE in the last 168 hours. No results for input(s): AMMONIA in the last 168 hours.  Cardiac Enzymes: No results for input(s): CKTOTAL, CKMB, CKMBINDEX, TROPONINI in the last 168 hours. BNP (last 3 results) No results for input(s): BNP in the last 8760 hours.  ProBNP (last 3 results) No results for input(s): PROBNP in the last 8760 hours.  Studies:  DG Elbow 2 Views Left  Result Date: 06/22/2020 CLINICAL DATA:  Fall EXAM: LEFT ELBOW - 2 VIEW COMPARISON:  04/17/2019 FINDINGS: Three view radiograph of the left elbow is slightly limited by suboptimal  positioning. Despite this, there is normal alignment. No fracture or dislocation. No effusion. Subcutaneous calcific densities are again seen within the medial soft tissues IMPRESSION: No acute bony abnormality. Electronically Signed   By: Helyn Numbers MD   On: 06/22/2020 19:49   CT Head Wo Contrast  Result Date: 06/22/2020 CLINICAL DATA:  86 year old male with seizure and possible head and neck injury. EXAM: CT HEAD WITHOUT CONTRAST CT CERVICAL SPINE WITHOUT CONTRAST TECHNIQUE: Multidetector CT imaging of the head and cervical spine was performed following the standard protocol without intravenous contrast. Multiplanar CT image reconstructions of the cervical spine were also generated. COMPARISON:  05/07/2020 head CT and prior studies FINDINGS: CT HEAD FINDINGS Brain: No evidence of acute infarction, hemorrhage, hydrocephalus, extra-axial collection or mass lesion/mass effect. Moderate to severe chronic small-vessel white matter ischemic changes and remote RIGHT pontine infarct again noted. Vascular: Carotid atherosclerotic calcifications are noted. Skull: Normal. Negative for fracture or focal lesion. Sinuses/Orbits: No acute finding. Other: None. CT CERVICAL SPINE FINDINGS Alignment: Normal. Skull base and vertebrae: No acute fracture. No primary bone lesion or focal  pathologic process. Soft tissues and spinal canal: No prevertebral fluid or swelling. No visible canal hematoma. Disc levels: Multilevel degenerative disc disease and facet arthropathy again noted. Degenerative disc disease/spondylosis is greatest at C3-4. Upper chest: No acute abnormality Other: None IMPRESSION: 1. No evidence of acute intracranial abnormality. Moderate to severe chronic small-vessel white matter ischemic changes and remote RIGHT pontine infarct. 2. No static evidence of acute injury to the cervical spine. Electronically Signed   By: Harmon Pier M.D.   On: 06/22/2020 17:13   CT Cervical Spine Wo Contrast  Result Date: 06/22/2020 CLINICAL DATA:  86 year old male with seizure and possible head and neck injury. EXAM: CT HEAD WITHOUT CONTRAST CT CERVICAL SPINE WITHOUT CONTRAST TECHNIQUE: Multidetector CT imaging of the head and cervical spine was performed following the standard protocol without intravenous contrast. Multiplanar CT image reconstructions of the cervical spine were also generated. COMPARISON:  05/07/2020 head CT and prior studies FINDINGS: CT HEAD FINDINGS Brain: No evidence of acute infarction, hemorrhage, hydrocephalus, extra-axial collection or mass lesion/mass effect. Moderate to severe chronic small-vessel white matter ischemic changes and remote RIGHT pontine infarct again noted. Vascular: Carotid atherosclerotic calcifications are noted. Skull: Normal. Negative for fracture or focal lesion. Sinuses/Orbits: No acute finding. Other: None. CT CERVICAL SPINE FINDINGS Alignment: Normal. Skull base and vertebrae: No acute fracture. No primary bone lesion or focal pathologic process. Soft tissues and spinal canal: No prevertebral fluid or swelling. No visible canal hematoma. Disc levels: Multilevel degenerative disc disease and facet arthropathy again noted. Degenerative disc disease/spondylosis is greatest at C3-4. Upper chest: No acute abnormality Other: None IMPRESSION: 1. No  evidence of acute intracranial abnormality. Moderate to severe chronic small-vessel white matter ischemic changes and remote RIGHT pontine infarct. 2. No static evidence of acute injury to the cervical spine. Electronically Signed   By: Harmon Pier M.D.   On: 06/22/2020 17:13       Taahir Grisby S Elliannah Wayment   [facility] If 7PM-7AM, please contact night-coverage at [email or web address], Office  [phone]   06/23/2020, 8:55 AM  LOS: 1 day

## 2020-06-23 NOTE — ED Notes (Signed)
Pt cleaned of incontinent urine episode by this RN and Beth, NT. Pt given partial bed bath, clean sheets, bed pad, and clean incontinence pad placed. Warm blankets given per request.  Pt c/o L shoulder pain, see MAR.  Pt states he is comfortable, no other needs expressed at this time.

## 2020-06-23 NOTE — ED Notes (Signed)
HOSPITALIST  at bedside. 

## 2020-06-23 NOTE — ED Notes (Signed)
Pt external urinary catheter changed by this tech and Marylene Land, RN; pt wet and cleansed. New check and brief are applied; clean gown and clean blankets in place. No other needs voiced at this time.

## 2020-06-23 NOTE — Consult Note (Signed)
Reason for Consult: seizure  Requesting Physician: Dr. Sharl Ma   CC: Possible s/z d/o   HPI: Ronald Matthews is an 86 y.o. male with medical history of hypertension, CAD, previous CVA with left-sided weakness, dementia presents with episode of loss of consciousness.  Patient has history of seizures in the past had negative work-up including EEG in July 2021.  Patient also had a normal UA so started on IV ceftriaxone.  Urine culture obtained and pending.  Currently improved and back to baseline it appears.    Past Medical History:  Diagnosis Date  . Coronary artery disease   . Dementia (HCC)   . Hypertension   . Thrombocytopenia (HCC)     History reviewed. No pertinent surgical history.  History reviewed. No pertinent family history.  Social History:  reports that he has been smoking. He has never used smokeless tobacco. He reports that he does not drink alcohol and does not use drugs.  Allergies  Allergen Reactions  . Cephalexin Other (See Comments)  . Ppd [Tuberculin Purified Protein Derivative] Other (See Comments)    "Allergic," per MAR  . Tuberculin Tests Other (See Comments)    "Allergic," per MAR    Medications: I have reviewed the patient's current medications.  ROS: Unable to obtain due to confusio   Physical Examination: Blood pressure (!) 90/52, pulse (!) 53, temperature 98.2 F (36.8 C), temperature source Oral, resp. rate 12, height 6\' 2"  (1.88 m), weight 90.7 kg, SpO2 97 %.   Neurological Examination   Mental Status: Alert to name and location  Cranial Nerves: II: Visual fields grossly normal, pupils equal, round, reactive to light and accommodation III,IV, VI: ptosis not present, extra-ocular motions intact bilaterally V,VII: smile symmetric, facial light touch sensation normal bilaterally VIII: hearing normal bilaterally IX,X: gag reflex present XII: midline tongue extension Motor: Generalized weakness  Sensory: Pinprick and light touch intact throughout,  bilaterally Deep Tendon Reflexes: 1+ and symmetric throughout     Laboratory Studies:   Basic Metabolic Panel: Recent Labs  Lab 06/22/20 1000 06/22/20 2338  NA 138  --   K 3.9  --   CL 103  --   CO2 22  --   GLUCOSE 82  --   BUN 20  --   CREATININE 1.04 0.84  CALCIUM 9.1  --     Liver Function Tests: No results for input(s): AST, ALT, ALKPHOS, BILITOT, PROT, ALBUMIN in the last 168 hours. No results for input(s): LIPASE, AMYLASE in the last 168 hours. No results for input(s): AMMONIA in the last 168 hours.  CBC: Recent Labs  Lab 06/22/20 1000 06/22/20 2338  WBC 5.2 5.4  HGB 10.9* 10.1*  HCT 33.5* 30.0*  MCV 85.2 83.1  PLT 252 216    Cardiac Enzymes: No results for input(s): CKTOTAL, CKMB, CKMBINDEX, TROPONINI in the last 168 hours.  BNP: Invalid input(s): POCBNP  CBG: No results for input(s): GLUCAP in the last 168 hours.  Microbiology: Results for orders placed or performed during the hospital encounter of 06/22/20  SARS Coronavirus 2 by RT PCR (hospital order, performed in [facility] hospital lab) Nasopharyngeal Nasopharyngeal Swab     Status: None   Collection Time: 06/22/20  7:34 PM   Specimen: Nasopharyngeal Swab  Result Value Ref Range Status   SARS Coronavirus 2 NEGATIVE NEGATIVE Final    Comment: (NOTE) SARS-CoV-2 target nucleic acids are NOT DETECTED.  The SARS-CoV-2 RNA is generally detectable in upper and lower respiratory specimens during the acute phase of  infection. The lowest concentration of SARS-CoV-2 viral copies this assay can detect is 250 copies / mL. A negative result does not preclude SARS-CoV-2 infection and should not be used as the sole basis for treatment or other patient management decisions.  A negative result may occur with improper specimen collection / handling, submission of specimen other than nasopharyngeal swab, presence of viral mutation(s) within the areas targeted by this assay, and inadequate number of viral  copies (<250 copies / mL). A negative result must be combined with clinical observations, patient history, and epidemiological information.  Fact Sheet for Patients:   BoilerBrush.com.cy  Fact Sheet for Healthcare Providers: https://pope.com/  This test is not yet approved or  cleared by the [place] FDA and has been authorized for detection and/or diagnosis of SARS-CoV-2 by FDA under an Emergency Use Authorization (EUA).  This EUA will remain in effect (meaning this test can be used) for the duration of the COVID-19 declaration under Section 564(b)(1) of the Act, 21 U.S.C. section 360bbb-3(b)(1), unless the authorization is terminated or revoked sooner.  Performed at [facility], [address] [place], [place] [address]     Coagulation Studies: No results for input(s): LABPROT, INR in the last 72 hours.  Urinalysis:  Recent Labs  Lab 06/22/20 2140  COLORURINE YELLOW*  LABSPEC 1.016  PHURINE 7.0  GLUCOSEU NEGATIVE  HGBUR MODERATE*  BILIRUBINUR NEGATIVE  KETONESUR 5*  PROTEINUR 100*  NITRITE NEGATIVE  LEUKOCYTESUR TRACE*    Lipid Panel:     Component Value Date/Time   CHOL 130 05/08/2020 0446   TRIG 44 05/08/2020 0446   HDL 59 05/08/2020 0446   CHOLHDL 2.2 05/08/2020 0446   VLDL 9 05/08/2020 0446   LDLCALC 62 05/08/2020 0446    HgbA1C:  Lab Results  Component Value Date   HGBA1C 5.7 (H) 05/08/2020    Urine Drug Screen:      Component Value Date/Time   LABOPIA NONE DETECTED 05/07/2020 1708   COCAINSCRNUR NONE DETECTED 05/07/2020 1708   LABBENZ NONE DETECTED 05/07/2020 1708   AMPHETMU NONE DETECTED 05/07/2020 1708   THCU NONE DETECTED 05/07/2020 1708   LABBARB NONE DETECTED 05/07/2020 1708    Alcohol Level: No results for input(s): ETH in the last 168 hours.  Other results: EKG: normal EKG, normal sinus rhythm, unchanged from previous tracings.  Imaging: DG Elbow 2 Views  Left  Result Date: 06/22/2020 CLINICAL DATA:  Fall EXAM: LEFT ELBOW - 2 VIEW COMPARISON:  04/17/2019 FINDINGS: Three view radiograph of the left elbow is slightly limited by suboptimal positioning. Despite this, there is normal alignment. No fracture or dislocation. No effusion. Subcutaneous calcific densities are again seen within the medial soft tissues IMPRESSION: No acute bony abnormality. Electronically Signed   By: Helyn Numbers MD   On: 06/22/2020 19:49   CT Head Wo Contrast  Result Date: 06/22/2020 CLINICAL DATA:  86 year old male with seizure and possible head and neck injury. EXAM: CT HEAD WITHOUT CONTRAST CT CERVICAL SPINE WITHOUT CONTRAST TECHNIQUE: Multidetector CT imaging of the head and cervical spine was performed following the standard protocol without intravenous contrast. Multiplanar CT image reconstructions of the cervical spine were also generated. COMPARISON:  05/07/2020 head CT and prior studies FINDINGS: CT HEAD FINDINGS Brain: No evidence of acute infarction, hemorrhage, hydrocephalus, extra-axial collection or mass lesion/mass effect. Moderate to severe chronic small-vessel white matter ischemic changes and remote RIGHT pontine infarct again noted. Vascular: Carotid atherosclerotic calcifications are noted. Skull: Normal. Negative for fracture or focal lesion.  Sinuses/Orbits: No acute finding. Other: None. CT CERVICAL SPINE FINDINGS Alignment: Normal. Skull base and vertebrae: No acute fracture. No primary bone lesion or focal pathologic process. Soft tissues and spinal canal: No prevertebral fluid or swelling. No visible canal hematoma. Disc levels: Multilevel degenerative disc disease and facet arthropathy again noted. Degenerative disc disease/spondylosis is greatest at C3-4. Upper chest: No acute abnormality Other: None IMPRESSION: 1. No evidence of acute intracranial abnormality. Moderate to severe chronic small-vessel white matter ischemic changes and remote RIGHT pontine  infarct. 2. No static evidence of acute injury to the cervical spine. Electronically Signed   By: Harmon Pier M.D.   On: 06/22/2020 17:13   CT Cervical Spine Wo Contrast  Result Date: 06/22/2020 CLINICAL DATA:  86 year old male with seizure and possible head and neck injury. EXAM: CT HEAD WITHOUT CONTRAST CT CERVICAL SPINE WITHOUT CONTRAST TECHNIQUE: Multidetector CT imaging of the head and cervical spine was performed following the standard protocol without intravenous contrast. Multiplanar CT image reconstructions of the cervical spine were also generated. COMPARISON:  05/07/2020 head CT and prior studies FINDINGS: CT HEAD FINDINGS Brain: No evidence of acute infarction, hemorrhage, hydrocephalus, extra-axial collection or mass lesion/mass effect. Moderate to severe chronic small-vessel white matter ischemic changes and remote RIGHT pontine infarct again noted. Vascular: Carotid atherosclerotic calcifications are noted. Skull: Normal. Negative for fracture or focal lesion. Sinuses/Orbits: No acute finding. Other: None. CT CERVICAL SPINE FINDINGS Alignment: Normal. Skull base and vertebrae: No acute fracture. No primary bone lesion or focal pathologic process. Soft tissues and spinal canal: No prevertebral fluid or swelling. No visible canal hematoma. Disc levels: Multilevel degenerative disc disease and facet arthropathy again noted. Degenerative disc disease/spondylosis is greatest at C3-4. Upper chest: No acute abnormality Other: None IMPRESSION: 1. No evidence of acute intracranial abnormality. Moderate to severe chronic small-vessel white matter ischemic changes and remote RIGHT pontine infarct. 2. No static evidence of acute injury to the cervical spine. Electronically Signed   By: Harmon Pier M.D.   On: 06/22/2020 17:13     Assessment/Plan:  86 y.o. male with medical history of hypertension, CAD, previous CVA with left-sided weakness, dementia presents with episode of loss of consciousness.   Patient has history of seizures in the past had negative work-up including EEG in July 2021.  Patient also had a normal UA so started on IV ceftriaxone.  Urine culture obtained and pending.  Currently improved and back to baseline it appears.  - CtH with chronic infarcts and small vessel changes - now second episode. Pt does not drive. I see no reason not to start him on small dose of AED.  - chronic strokes could have provoked the potential seizures - Will start Keppra 500 BID - call if any questions - d/c planning from Neurological stand point 06/23/2020, 11:53 AM

## 2020-06-24 ENCOUNTER — Other Ambulatory Visit: Payer: Self-pay

## 2020-06-24 LAB — CBC
HCT: 29.9 % — ABNORMAL LOW (ref 39.0–52.0)
Hemoglobin: 9.8 g/dL — ABNORMAL LOW (ref 13.0–17.0)
MCH: 27.5 pg (ref 26.0–34.0)
MCHC: 32.8 g/dL (ref 30.0–36.0)
MCV: 84 fL (ref 80.0–100.0)
Platelets: 226 10*3/uL (ref 150–400)
RBC: 3.56 MIL/uL — ABNORMAL LOW (ref 4.22–5.81)
RDW: 15.4 % (ref 11.5–15.5)
WBC: 4.4 10*3/uL (ref 4.0–10.5)
nRBC: 0 % (ref 0.0–0.2)

## 2020-06-24 LAB — COMPREHENSIVE METABOLIC PANEL
ALT: 14 U/L (ref 0–44)
AST: 20 U/L (ref 15–41)
Albumin: 3.5 g/dL (ref 3.5–5.0)
Alkaline Phosphatase: 40 U/L (ref 38–126)
Anion gap: 12 (ref 5–15)
BUN: 14 mg/dL (ref 8–23)
CO2: 23 mmol/L (ref 22–32)
Calcium: 8.8 mg/dL — ABNORMAL LOW (ref 8.9–10.3)
Chloride: 101 mmol/L (ref 98–111)
Creatinine, Ser: 0.9 mg/dL (ref 0.61–1.24)
GFR calc Af Amer: >60 mL/min (ref >60)
GFR calc non Af Amer: >60 mL/min (ref >60)
Glucose, Bld: 81 mg/dL (ref 70–99)
Potassium: 4.4 mmol/L (ref 3.5–5.1)
Sodium: 136 mmol/L (ref 135–145)
Total Bilirubin: 0.7 mg/dL (ref 0.3–1.2)
Total Protein: 6.3 g/dL — ABNORMAL LOW (ref 6.5–8.1)

## 2020-06-24 NOTE — ED Notes (Signed)
Waiting on pharmacy for remaining meds due at 2200

## 2020-06-24 NOTE — TOC Initial Note (Signed)
Transition of Care [facility]) - Initial/Assessment Note    Patient Details  Name: Ronald Matthews MRN: [id number] Date of Birth: Oct 20, 1933  Transition of Care [facility]) CM/SW Contact:    Allayne Butcher, RN Phone Number: 06/24/2020, 9:36 AM  Clinical Narrative:                 Patient admitted to the hospital for suspected seizure, patient will be started on Keppra.  Patient is from [facility] facility.  Tammy from [facility] reports that they should be able to take him back as long as he is being started on some medication for the seizure.  Tammy reports that the patient is mostly in his wheelchair during the day but he can stand and pivot with a walker.  Patient is also incontinent at baseline.  Expected discharge later today after evaluated by PT.    Expected Discharge Plan: Assisted Living Barriers to Discharge: Continued Medical Work up   Patient Goals and CMS Choice        Expected Discharge Plan and Services Expected Discharge Plan: Assisted Living   Discharge Planning Services: CM Consult   Living arrangements for the past 2 months: Assisted Living Facility                                      Prior Living Arrangements/Services Living arrangements for the past 2 months: Assisted Living Facility Lives with:: Facility Resident Patient language and need for interpreter reviewed:: Yes Do you feel safe going back to the place where you live?: Yes      Need for Family Participation in Patient Care: Yes (Comment) (dementia) Care giver support system in place?: Yes (comment) (sister and ALF) Current home services: DME (walker and wheelchair) Criminal Activity/Legal Involvement Pertinent to Current Situation/Hospitalization: No - Comment as needed  Activities of Daily Living Home Assistive Devices/Equipment: Other (Comment) (From [facility] ) ADL Screening (condition at time of admission) Patient's cognitive ability adequate to safely complete daily  activities?: No Is the patient deaf or have difficulty hearing?: No Does the patient have difficulty seeing, even when wearing glasses/contacts?: No Does the patient have difficulty concentrating, remembering, or making decisions?: Yes Patient able to express need for assistance with ADLs?: No Does the patient have difficulty dressing or bathing?: Yes Independently performs ADLs?: No Communication: Needs assistance Dressing (OT): Needs assistance, Dependent Grooming: Needs assistance, Dependent Feeding: Needs assistance, Dependent Bathing: Needs assistance, Dependent Toileting: Needs assistance, Dependent In/Out Bed: Needs assistance, Dependent Walks in Home: Needs assistance, Dependent Does the patient have difficulty walking or climbing stairs?: Yes Weakness of Legs: Both Weakness of Arms/Hands: Both  Permission Sought/Granted Permission sought to share information with : Case Manager, Magazine features editor, Family Supports Permission granted to share information with : Yes, Verbal Permission Granted  Share Information with NAME: [name]  Permission granted to share info w AGENCY: [facility]  Permission granted to share info w Relationship: sister     Emotional Assessment Appearance:: Appears stated age Attitude/Demeanor/Rapport: Engaged Affect (typically observed): Accepting Orientation: : Oriented to Self Alcohol / Substance Use: Not Applicable Psych Involvement: No (comment)  Admission diagnosis:  LOC (loss of consciousness) (HCC) [R40.20] Episode of unresponsiveness [R41.89] Patient Active Problem List   Diagnosis Date Noted  . LOC (loss of consciousness) (HCC) 06/22/2020  . Seizure (HCC) 06/22/2020  . Episode of unresponsiveness   . Acute cystitis with hematuria   .  Syncope 05/07/2020  . Dehydration 11/28/2019  . Altered mental status 11/27/2019  . Jaw pain   . CVA (cerebral vascular accident) (HCC) 04/17/2019  . Ischemic stroke (HCC)  07/15/2018  . Hypertension 07/15/2018  . Dementia (HCC) 07/15/2018  . Coronary artery disease 07/15/2018   PCP:  Galvin Proffer, MD Pharmacy:   [facility] - [place], [place] - [address] [place] [address] Phone: [phone] Fax: [phone]     Social Determinants of Health (SDOH) Interventions    Readmission Risk Interventions No flowsheet data found.

## 2020-06-24 NOTE — Plan of Care (Signed)
  Problem: Urinary Elimination: Goal: Signs and symptoms of infection will decrease Outcome: Progressing   Problem: Education: Goal: Knowledge of General Education information will improve Description: Including pain rating scale, medication(s)/side effects and non-pharmacologic comfort measures Outcome: Progressing   Problem: Health Behavior/Discharge Planning: Goal: Ability to manage health-related needs will improve Outcome: Progressing   Problem: Clinical Measurements: Goal: Ability to maintain clinical measurements within normal limits will improve Outcome: Progressing Goal: Will remain free from infection Outcome: Progressing Goal: Diagnostic test results will improve Outcome: Progressing Goal: Respiratory complications will improve Outcome: Progressing Goal: Cardiovascular complication will be avoided Outcome: Progressing   Problem: Activity: Goal: Risk for activity intolerance will decrease Outcome: Progressing   Problem: Nutrition: Goal: Adequate nutrition will be maintained Outcome: Progressing   Problem: Coping: Goal: Level of anxiety will decrease Outcome: Progressing   Problem: Elimination: Goal: Will not experience complications related to bowel motility Outcome: Progressing Goal: Will not experience complications related to urinary retention Outcome: Progressing   Problem: Pain Managment: Goal: General experience of comfort will improve Outcome: Progressing   Problem: Safety: Goal: Ability to remain free from injury will improve Outcome: Progressing   Problem: Skin Integrity: Goal: Risk for impaired skin integrity will decrease Outcome: Progressing   Problem: Education: Goal: Knowledge of disease or condition will improve Outcome: Progressing Goal: Knowledge of patient specific risk factors addressed and post discharge goals established will improve Outcome: Progressing Goal: Individualized Educational Video(s) Outcome: Progressing    Problem: Coping: Goal: Will verbalize positive feelings about self Outcome: Progressing Goal: Will identify appropriate support needs Outcome: Progressing   Problem: Health Behavior/Discharge Planning: Goal: Ability to manage health-related needs will improve Outcome: Progressing   Problem: Self-Care: Goal: Ability to participate in self-care as condition permits will improve Outcome: Progressing Goal: Verbalization of feelings and concerns over difficulty with self-care will improve Outcome: Progressing Goal: Ability to communicate needs accurately will improve Outcome: Progressing   Problem: Nutrition: Goal: Risk of aspiration will decrease Outcome: Progressing Goal: Dietary intake will improve Outcome: Progressing   Problem: Ischemic Stroke/TIA Tissue Perfusion: Goal: Complications of ischemic stroke/TIA will be minimized Outcome: Progressing

## 2020-06-24 NOTE — Evaluation (Signed)
Physical Therapy Evaluation Patient Details Name: Ronald Matthews MRN: [id number] DOB: 02-23-1933 Today's Date: 06/24/2020   History of Present Illness  Pt is an 86 y.o. male with medical history of hypertension, CAD, previous CVA with left-sided weakness, dementia presents with episode of loss of consciousness.  Patient has history of seizures in the past had negative work-up including EEG in July 2021.    Clinical Impression  Patient alert, oriented to self and place (knew it was a hospital but not the name), disoriented to time and situation, denied pain. Pt unable to provide PLOF, per chart pt is from ALF and uses WC at baseline (pt reported he is from home, ambulatory).  The patient demonstrated supine to sit with HOB elevated with supervision. Fair sitting balance noted. Sit <> stand from EOB modA, pt pulls heavily on RW and needed at least minA for safe eccentric lowering to chair. Pt CGA with RW to stand and take several steps to pivot to recliner.  Overall the patient demonstrated mild deficits from PLOF and would benefit from further skilled PT intervention to maximize safety and independence. Recommendation is return to ALF for HHPT and supervision for all mobility.     Follow Up Recommendations Home health PT;Supervision for mobility/OOB;Supervision - Intermittent    Equipment Recommendations  None recommended by PT    Recommendations for Other Services       Precautions / Restrictions Precautions Precautions: Fall Precaution Comments: L UE and LLE weak due to chronic L hemiparesis Restrictions Weight Bearing Restrictions: No      Mobility  Bed Mobility Overal bed mobility: Needs Assistance Bed Mobility: Supine to Sit     Supine to sit: HOB elevated;Supervision     General bed mobility comments: use of bed rails  Transfers Overall transfer level: Needs assistance Equipment used: Rolling walker (2 wheeled) Transfers: Sit to/from Stand Sit to Stand: Mod  assist;Min assist         General transfer comment: modA to stand with PT stabilizing RW, minA for eccentric lowering to chair, pt cued for hand placement with poor carryover  Ambulation/Gait             General Gait Details: deferred, pt nonambulatory at baseline  Stairs            Wheelchair Mobility    Modified Rankin (Stroke Patients Only)       Balance Overall balance assessment: Needs assistance Sitting-balance support: Feet supported Sitting balance-Leahy Scale: Fair       Standing balance-Leahy Scale: Poor Standing balance comment: reliant on UE support                             Pertinent Vitals/Pain Pain Assessment: No/denies pain    Home Living Family/patient expects to be discharged to:: Skilled nursing facility                 Additional Comments: Pt. resides at [facility].    Prior Function Level of Independence: Needs assistance   Gait / Transfers Assistance Needed: wheelchair to self propel  ADL's / Homemaking Assistance Needed: assistance for bathing and dressing  Comments: information gathered from previous chart review/admisison     Hand Dominance   Dominant Hand: Right    Extremity/Trunk Assessment   Upper Extremity Assessment Upper Extremity Assessment: Generalized weakness;LUE deficits/detail;RUE deficits/detail RUE Deficits / Details: grossly 4-/5 RUE Coordination: WNL LUE Deficits / Details: AAROM for shoulder flexion past 60 degrees, unable  to fully straighten elbow, 2+/5 elbow flexion, grip 3+/5 LUE Coordination: decreased fine motor;decreased gross motor    Lower Extremity Assessment Lower Extremity Assessment: Generalized weakness;RLE deficits/detail;LLE deficits/detail RLE Deficits / Details: grossly 4/5 LLE Deficits / Details: grossly 4/5, though decreased TKE and knee flexion noted    Cervical / Trunk Assessment Cervical / Trunk Assessment: Kyphotic  Communication   Communication:  No difficulties  Cognition Arousal/Alertness: Awake/alert Behavior During Therapy: WFL for tasks assessed/performed Overall Cognitive Status: No family/caregiver present to determine baseline cognitive functioning                                 General Comments: A&O to name and place (knew it was a hospital). disoriented to time and situation. Dementia at baseline per chart      General Comments      Exercises     Assessment/Plan    PT Assessment Patient needs continued PT services  PT Problem List Decreased activity tolerance;Decreased balance;Decreased safety awareness;Decreased knowledge of use of DME;Decreased mobility       PT Treatment Interventions DME instruction;Balance training;Neuromuscular re-education;Functional mobility training;Patient/family education;Therapeutic activities;Wheelchair mobility training;Therapeutic exercise    PT Goals (Current goals can be found in the Care Plan section)  Acute Rehab PT Goals PT Goal Formulation: Patient unable to participate in goal setting Time For Goal Achievement: 07/08/20 Potential to Achieve Goals: Good    Frequency Min 2X/week   Barriers to discharge        Co-evaluation               AM-PAC PT "6 Clicks" Mobility  Outcome Measure Help needed turning from your back to your side while in a flat bed without using bedrails?: A Little Help needed moving from lying on your back to sitting on the side of a flat bed without using bedrails?: A Little Help needed moving to and from a bed to a chair (including a wheelchair)?: A Little Help needed standing up from a chair using your arms (e.g., wheelchair or bedside chair)?: A Little Help needed to walk in hospital room?: Total Help needed climbing 3-5 steps with a railing? : Total 6 Click Score: 14    End of Session Equipment Utilized During Treatment: Gait belt Activity Tolerance: Patient tolerated treatment well Patient left: in chair;with call  bell/phone within reach;with chair alarm set Nurse Communication: Mobility status PT Visit Diagnosis: Muscle weakness (generalized) (M62.81);Difficulty in walking, not elsewhere classified (R26.2);Other abnormalities of gait and mobility (R26.89)    Time: 0350-0938 PT Time Calculation (min) (ACUTE ONLY): 19 min   Charges:   PT Evaluation $PT Eval Low Complexity: 1 Low          Olga Coaster PT, DPT 12:00 PM,06/24/20

## 2020-06-24 NOTE — Evaluation (Signed)
Clinical/Bedside Swallow Evaluation Patient Details  Name: Ronald Matthews MRN: [id number] Date of Birth: 1933-11-01  Today's Date: 06/24/2020 Time: SLP Start Time (ACUTE ONLY): 1140 SLP Stop Time (ACUTE ONLY): 1235 SLP Time Calculation (min) (ACUTE ONLY): 55 min  Past Medical History:  Past Medical History:  Diagnosis Date  . Coronary artery disease   . Dementia (HCC)   . Hypertension   . Thrombocytopenia (HCC)    Past Surgical History: History reviewed. No pertinent surgical history. HPI:  Pt is an 86 y.o. male with medical history of hypertension, CAD, previous CVA with left-sided weakness, dementia presents with episode of loss of consciousness.  Patient has history of seizures in the past had negative work-up including EEG in July 2021.  CT Head with chronic infarcts and small vessel changes   Assessment / Plan / Recommendation Clinical Impression  Pt appears to present w/ adequate oropharyngeal phase swallow function w/ No gross oropharyngeal phase dysphagia noted, No neuromuscular deficits noted.Pt consumed po trials w/ No overt, clinical s/s of aspiration during po trials. Pt appears at reduced risk for aspiration when following general aspiration precautions. Pt is Missing Most Dentition; needed extra time w/ for anterior mastication/mashing of moistened solids. During po trials, pt consumed all consistencies w/ no overt coughing, decline in vocal quality, or change in respiratory presentation during/post trials. Oral phase appeared [facility] w/ timely bolus management and control of bolus propulsion for A-P transfer for swallowing; extra Time needed for mashing/masticating softened solids broken down and moistened well. Oral clearing achieved w/ all trial consistencies. Pt tolerated Pills swallowed Whole in puree w/ NSG/SLP. OM Exam appeared [facility] w/ no unilateral weakness noted. Speech Clear. Pt fed self w/ setup support. Recommend a Mech Soft diet w/ Meats MINCED w/ gravy; Thin liquidsVIA  CUP. Recommend general aspiration precautions, Pills WHOLE in Puree for safer, easier swallowing. Education given on Pills in Puree; food consistencies and easy to eat options; general aspiration precautions. NSG to reconsult if any new needs arise. NSG agreed.  SLP Visit Diagnosis: Dysphagia, unspecified (R13.10)    Aspiration Risk   (reduced following precautions)    Diet Recommendation  Mech Soft diet w/ Meats Minced, Gravy added (pt is missing most Dentition); Thin liquids. General aspiration precautions.  Medication Administration: Whole meds with puree (for easier swallowing)    Other  Recommendations Recommended Consults:  (Dietician f/u) Oral Care Recommendations: Oral care BID;Oral care before and after PO;Patient independent with oral care Other Recommendations:  (n/a)   Follow up Recommendations None      Frequency and Duration  (n/a)   (n/a)       Prognosis Prognosis for Safe Diet Advancement: Fair (-Good) Barriers to Reach Goals: Cognitive deficits;Time post onset;Severity of deficits      Swallow Study   General Date of Onset: 06/23/20 HPI: Pt is an 86 y.o. male with medical history of hypertension, CAD, previous CVA with left-sided weakness, dementia presents with episode of loss of consciousness.  Patient has history of seizures in the past had negative work-up including EEG in July 2021.  CT Head with chronic infarcts and small vessel changes Type of Study: Bedside Swallow Evaluation Previous Swallow Assessment: none Diet Prior to this Study: NPO (regular diet at facility) Temperature Spikes Noted: No Respiratory Status: Room air History of Recent Intubation: No Behavior/Cognition: Alert;Cooperative;Pleasant mood;Distractible;Requires cueing Oral Cavity Assessment: Dry (min) Oral Care Completed by SLP: Yes Oral Cavity - Dentition: Missing dentition (has 4 teeth) Vision: Functional for self-feeding Self-Feeding Abilities: Able to  feed self;Needs set  up Patient Positioning: Upright in chair Baseline Vocal Quality: Low vocal intensity (soft, mumbled) Volitional Cough:  (adequate) Volitional Swallow: Able to elicit    Oral/Motor/Sensory Function Overall Oral Motor/Sensory Function: Within functional limits   Ice Chips Ice chips: Within functional limits Presentation: Spoon (fed; 3 trials)   Thin Liquid Thin Liquid: Within functional limits Presentation: Cup;Self Fed (~4 ozs total)    Nectar Thick Nectar Thick Liquid: Not tested   Honey Thick Honey Thick Liquid: Not tested   Puree Puree: Within functional limits Presentation: Spoon;Self Fed (~4 ozs)   Solid     Solid: Impaired Presentation: Spoon;Self Fed (5 trials) Oral Phase Impairments: Impaired mastication (missing dentition) Oral Phase Functional Implications: Impaired mastication (missing dentition) Pharyngeal Phase Impairments:  (none) Other Comments: moistened foods well        Jerilynn Som, MS, CCC-SLP Evonna Stoltz 06/24/2020,12:45 PM

## 2020-06-24 NOTE — TOC Progression Note (Signed)
Transition of Care [facility]) - Progression Note    Patient Details  Name: Ronald Matthews MRN: [id number] Date of Birth: 1933-11-01  [facility]) CM/SW Contact  Allayne Butcher, RN Phone Number: 06/24/2020, 1:49 PM  Clinical Narrative:     Plan discharge for tomorrow back to [facility]. Urine Culture obtained this morning and MD would like to find out the results before discharge.  Tammy at [facility] updated on plan of care.   Expected Discharge Plan: Assisted Living Barriers to Discharge: Continued Medical Work up  Expected Discharge Plan and Services Expected Discharge Plan: Assisted Living   Discharge Planning Services: CM Consult   Living arrangements for the past 2 months: Assisted Living Facility                                       Social Determinants of Health (SDOH) Interventions    Readmission Risk Interventions No flowsheet data found.

## 2020-06-24 NOTE — Progress Notes (Signed)
[facility]  PROGRESS NOTE  Bland Rudzinski POE:[id number] DOB: 11-Mar-1933 DOA: 06/22/2020 PCP: Galvin Proffer, MD   Brief HPI:   86 year old male with medical history of hypertension, CAD, previous CVA with left-sided weakness, dementia presents with episode of loss of consciousness.  Patient has history of seizures in the past had negative work-up including EEG in July 2021.  Patient also had a normal UA so started on IV ceftriaxone.  Urine culture obtained.    Subjective   Patient seen and examined, no further episodes of seizure or loss of consciousness in the hospital.  He was seen by neurology and started on Keppra.  Appreciate neurology input.   Assessment/Plan:     1. Loss of consciousness-resolved, likely postictal from seizure.  EEG done in July 2021 was unremarkable.  Neurology was consulted and patient started on Keppra 500 mg p.o. twice daily.  No further episodes in the hospital.  We will continue to monitor.  Continue seizure precautions.   2. ?  UTI-patient has a normal UA, started on IV ceftriaxone.  Follow urine culture results. 3. Hyperlipidemia-continue atorvastatin. 4. Hypertension-continue metoprolol. 5. History of CVA with left-sided weakness-continue aspirin, atorvastatin. 6. History of CAD-continue aspirin, atorvastatin, metoprolol.     COVID-19 Labs  No results for input(s): DDIMER, FERRITIN, LDH, CRP in the last 72 hours.  Lab Results  Component Value Date   SARSCOV2NAA NEGATIVE 06/22/2020   SARSCOV2NAA NEGATIVE 11/28/2019   SARSCOV2NAA NEGATIVE 04/17/2019     Scheduled medications:   . aspirin  81 mg Oral q AM  . atorvastatin  40 mg Oral Daily  . brimonidine  2 drop Both Eyes BID  . enoxaparin (LOVENOX) injection  40 mg Subcutaneous Q24H  . hydrocerin   Topical BID  . latanoprost  1 drop Both Eyes QHS  . levETIRAcetam  500 mg Oral BID  . liver oil-zinc oxide  1 application Topical BID  . metoprolol succinate  12.5 mg Oral Daily  .  multivitamin with minerals  1 tablet Oral Daily         CBG: No results for input(s): GLUCAP in the last 168 hours.  SpO2: 97 %    CBC: Recent Labs  Lab 06/22/20 1000 06/22/20 2338 06/24/20 0659  WBC 5.2 5.4 4.4  HGB 10.9* 10.1* 9.8*  HCT 33.5* 30.0* 29.9*  MCV 85.2 83.1 84.0  PLT 252 216 226    Basic Metabolic Panel: Recent Labs  Lab 06/22/20 1000 06/22/20 2338 06/24/20 0659  NA 138  --  136  K 3.9  --  4.4  CL 103  --  101  CO2 22  --  23  GLUCOSE 82  --  81  BUN 20  --  14  CREATININE 1.04 0.84 0.90  CALCIUM 9.1  --  8.8*     Liver Function Tests: Recent Labs  Lab 06/24/20 0659  AST 20  ALT 14  ALKPHOS 40  BILITOT 0.7  PROT 6.3*  ALBUMIN 3.5     Antibiotics: Anti-infectives (From admission, onward)   Start     Dose/Rate Route Frequency Ordered Stop   06/23/20 2000  cefTRIAXone (ROCEPHIN) 1 g in sodium chloride 0.9 % 100 mL IVPB        1 g 200 mL/hr over 30 Minutes Intravenous Every 24 hours 06/23/20 0127     06/22/20 2245  cefTRIAXone (ROCEPHIN) 1 g in sodium chloride 0.9 % 100 mL IVPB        1 g 200 mL/hr over  30 Minutes Intravenous  Once 06/22/20 2234 06/23/20 0003       DVT prophylaxis: Lovenox  Code Status: Full code  Family Communication: No family at bedside    Status is: Inpatient  Dispo: The patient is from: Home              Anticipated d/c is to: Home              Anticipated d/c date is: 06/25/2020              Patient currently not medically stable for discharge  Barrier to discharge-altered mental status/?  Seizure     Consultants:    Procedures:     Objective   Vitals:   06/24/20 0741 06/24/20 0917 06/24/20 1030 06/24/20 1149  BP: (!) 96/58  (!) 104/54 99/66  Pulse: 79 80 73 72  Resp: 17     Temp: 97.9 F (36.6 C)  98.6 F (37 C)   TempSrc: Oral  Oral   SpO2: 100%  97%   Weight:      Height:        Intake/Output Summary (Last 24 hours) at 06/24/2020 1446 Last data filed at 06/24/2020  0400 Gross per 24 hour  Intake 726 ml  Output --  Net 726 ml    08/21 1901 - 08/23 0700 In: 1508.1 [I.V.:782.1] Out: -   Filed Weights   06/22/20 1000 06/24/20 0304  Weight: 90.7 kg 64.6 kg    Physical Examination:    General-appears in no acute distress  Heart-S1-S2, regular, no murmur auscultated  Lungs-clear to auscultation bilaterally, no wheezing or crackles auscultated  Abdomen-soft, nontender, no organomegaly  Extremities-no edema in the lower extremities  Neuro-alert, oriented x 2    Data Reviewed:   Recent Results (from the past 240 hour(s))  SARS Coronavirus 2 by RT PCR (hospital order, performed in [facility] hospital lab) Nasopharyngeal Nasopharyngeal Swab     Status: None   Collection Time: 06/22/20  7:34 PM   Specimen: Nasopharyngeal Swab  Result Value Ref Range Status   SARS Coronavirus 2 NEGATIVE NEGATIVE Final    Comment: (NOTE) SARS-CoV-2 target nucleic acids are NOT DETECTED.  The SARS-CoV-2 RNA is generally detectable in upper and lower respiratory specimens during the acute phase of infection. The lowest concentration of SARS-CoV-2 viral copies this assay can detect is 250 copies / mL. A negative result does not preclude SARS-CoV-2 infection and should not be used as the sole basis for treatment or other patient management decisions.  A negative result may occur with improper specimen collection / handling, submission of specimen other than nasopharyngeal swab, presence of viral mutation(s) within the areas targeted by this assay, and inadequate number of viral copies (<250 copies / mL). A negative result must be combined with clinical observations, patient history, and epidemiological information.  Fact Sheet for Patients:   BoilerBrush.com.cy  Fact Sheet for Healthcare Providers: https://pope.com/  This test is not yet approved or  cleared by the [place] FDA and has been  authorized for detection and/or diagnosis of SARS-CoV-2 by FDA under an Emergency Use Authorization (EUA).  This EUA will remain in effect (meaning this test can be used) for the duration of the COVID-19 declaration under Section 564(b)(1) of the Act, 21 U.S.C. section 360bbb-3(b)(1), unless the authorization is terminated or revoked sooner.  Performed at [facility], [address] [place], [place] [address]     No results for input(s): LIPASE, AMYLASE in the  last 168 hours. No results for input(s): AMMONIA in the last 168 hours.  Cardiac Enzymes: No results for input(s): CKTOTAL, CKMB, CKMBINDEX, TROPONINI in the last 168 hours. BNP (last 3 results) No results for input(s): BNP in the last 8760 hours.  ProBNP (last 3 results) No results for input(s): PROBNP in the last 8760 hours.  Studies:  DG Elbow 2 Views Left  Result Date: 06/22/2020 CLINICAL DATA:  Fall EXAM: LEFT ELBOW - 2 VIEW COMPARISON:  04/17/2019 FINDINGS: Three view radiograph of the left elbow is slightly limited by suboptimal positioning. Despite this, there is normal alignment. No fracture or dislocation. No effusion. Subcutaneous calcific densities are again seen within the medial soft tissues IMPRESSION: No acute bony abnormality. Electronically Signed   By: Helyn Numbers MD   On: 06/22/2020 19:49   CT Head Wo Contrast  Result Date: 06/22/2020 CLINICAL DATA:  86 year old male with seizure and possible head and neck injury. EXAM: CT HEAD WITHOUT CONTRAST CT CERVICAL SPINE WITHOUT CONTRAST TECHNIQUE: Multidetector CT imaging of the head and cervical spine was performed following the standard protocol without intravenous contrast. Multiplanar CT image reconstructions of the cervical spine were also generated. COMPARISON:  05/07/2020 head CT and prior studies FINDINGS: CT HEAD FINDINGS Brain: No evidence of acute infarction, hemorrhage, hydrocephalus, extra-axial collection or mass lesion/mass effect.  Moderate to severe chronic small-vessel white matter ischemic changes and remote RIGHT pontine infarct again noted. Vascular: Carotid atherosclerotic calcifications are noted. Skull: Normal. Negative for fracture or focal lesion. Sinuses/Orbits: No acute finding. Other: None. CT CERVICAL SPINE FINDINGS Alignment: Normal. Skull base and vertebrae: No acute fracture. No primary bone lesion or focal pathologic process. Soft tissues and spinal canal: No prevertebral fluid or swelling. No visible canal hematoma. Disc levels: Multilevel degenerative disc disease and facet arthropathy again noted. Degenerative disc disease/spondylosis is greatest at C3-4. Upper chest: No acute abnormality Other: None IMPRESSION: 1. No evidence of acute intracranial abnormality. Moderate to severe chronic small-vessel white matter ischemic changes and remote RIGHT pontine infarct. 2. No static evidence of acute injury to the cervical spine. Electronically Signed   By: Harmon Pier M.D.   On: 06/22/2020 17:13   CT Cervical Spine Wo Contrast  Result Date: 06/22/2020 CLINICAL DATA:  86 year old male with seizure and possible head and neck injury. EXAM: CT HEAD WITHOUT CONTRAST CT CERVICAL SPINE WITHOUT CONTRAST TECHNIQUE: Multidetector CT imaging of the head and cervical spine was performed following the standard protocol without intravenous contrast. Multiplanar CT image reconstructions of the cervical spine were also generated. COMPARISON:  05/07/2020 head CT and prior studies FINDINGS: CT HEAD FINDINGS Brain: No evidence of acute infarction, hemorrhage, hydrocephalus, extra-axial collection or mass lesion/mass effect. Moderate to severe chronic small-vessel white matter ischemic changes and remote RIGHT pontine infarct again noted. Vascular: Carotid atherosclerotic calcifications are noted. Skull: Normal. Negative for fracture or focal lesion. Sinuses/Orbits: No acute finding. Other: None. CT CERVICAL SPINE FINDINGS Alignment: Normal.  Skull base and vertebrae: No acute fracture. No primary bone lesion or focal pathologic process. Soft tissues and spinal canal: No prevertebral fluid or swelling. No visible canal hematoma. Disc levels: Multilevel degenerative disc disease and facet arthropathy again noted. Degenerative disc disease/spondylosis is greatest at C3-4. Upper chest: No acute abnormality Other: None IMPRESSION: 1. No evidence of acute intracranial abnormality. Moderate to severe chronic small-vessel white matter ischemic changes and remote RIGHT pontine infarct. 2. No static evidence of acute injury to the cervical spine. Electronically Signed   By: Harmon Pier  M.D.   On: 06/22/2020 17:13       Destin Kittler Daune PerchS Egon Dittus   [facility] If 7PM-7AM, please contact night-coverage at [email or web address], Office  [phone]   06/24/2020, 2:46 PM  LOS: 2 days

## 2020-06-25 DIAGNOSIS — F0150 Vascular dementia without behavioral disturbance: Secondary | ICD-10-CM

## 2020-06-25 LAB — URINE CULTURE: Culture: >=100000 — AB

## 2020-06-25 MED ORDER — LEVETIRACETAM 500 MG PO TABS: 500.0000 mg | ORAL_TABLET | Freq: Two times a day (BID) | ORAL | 2 refills | Status: DC

## 2020-06-25 NOTE — NC FL2 (Signed)
[place] MEDICAID FL2 LEVEL OF CARE SCREENING TOOL     IDENTIFICATION  Patient Name: Ronald Matthews Birthdate: 05-23-1933 Sex: male Admission Date (Current Location): 06/22/2020  [place] and [place] Number:  Chiropodist and Address:  [facility], [address] [place], [place] [address]      Provider Number: [id number]  Attending Physician Name and Address:  Meredeth Ide, MD  Relative Name and Phone Number:  Jules Schick (sister) [phone]    Current Level of Care: Hospital Recommended Level of Care: Assisted Living Facility Prior Approval Number:    Date Approved/Denied:   PASRR Number:    Discharge Plan: Domiciliary (Rest home) ([facility])    Current Diagnoses: Patient Active Problem List   Diagnosis Date Noted  . LOC (loss of consciousness) (HCC) 06/22/2020  . Seizure (HCC) 06/22/2020  . Episode of unresponsiveness   . Acute cystitis with hematuria   . Syncope 05/07/2020  . Dehydration 11/28/2019  . Altered mental status 11/27/2019  . Jaw pain   . CVA (cerebral vascular accident) (HCC) 04/17/2019  . Ischemic stroke (HCC) 07/15/2018  . Hypertension 07/15/2018  . Dementia (HCC) 07/15/2018  . Coronary artery disease 07/15/2018    Orientation RESPIRATION BLADDER Height & Weight     Self  Normal Incontinent Weight: 64.6 kg Height:  6' (182.9 cm)  BEHAVIORAL SYMPTOMS/MOOD NEUROLOGICAL BOWEL NUTRITION STATUS    Convulsions/Seizures (Seizure- starting on Keppra) Incontinent Diet (Diet for Discharge: Mech Soft w/ MINCED meats w/ Gravy added to moisten foods; Thin liquids VIA CUP -- No Straws!  Pills WHOLE in Puree. Tray setup at meals.)  AMBULATORY STATUS COMMUNICATION OF NEEDS Skin   Extensive Assist Verbally Normal                       Personal Care Assistance Level of Assistance  Bathing, Feeding, Dressing Bathing Assistance: Limited assistance Feeding assistance: Limited assistance Dressing  Assistance: Limited assistance     Functional Limitations Info             SPECIAL CARE FACTORS FREQUENCY                       Contractures Contractures Info: Not present    Additional Factors Info  Code Status, Allergies Code Status Info: Full Allergies Info: Cephalexin, PPD, tuberculin tests           Current Medications (06/25/2020):  This is the current hospital active medication list Current Facility-Administered Medications  Medication Dose Route Frequency Provider Last Rate Last Admin  . acetaminophen (TYLENOL) tablet 650 mg  650 mg Oral Q4H PRN Rayne Du, MD   650 mg at 06/23/20 0139  . alum & mag hydroxide-simeth (MAALOX/MYLANTA) 200-200-20 MG/5ML suspension 30 mL  30 mL Oral Once PRN Rayne Du, MD      . aspirin chewable tablet 81 mg  81 mg Oral q AM Rayne Du, MD   81 mg at 06/25/20 9485  . atorvastatin (LIPITOR) tablet 40 mg  40 mg Oral Daily Rayne Du, MD   40 mg at 06/25/20 0913  . bismuth subsalicylate (PEPTO BISMOL) 262 MG/15ML suspension 15 mL  15 mL Oral Q2H PRN Rayne Du, MD      . brimonidine (ALPHAGAN) 0.2 % ophthalmic solution 2 drop  2 drop Both Eyes BID Rayne Du, MD   2 drop at 06/25/20 0915  . cefTRIAXone (ROCEPHIN) 1 g in sodium chloride 0.9 % 100 mL IVPB  1 g Intravenous Q24H Rayne Du, MD 200 mL/hr at 06/24/20 2346 1 g at 06/24/20 2346  . enoxaparin (LOVENOX) injection 40 mg  40 mg Subcutaneous Q24H Rayne Du, MD   40 mg at 06/25/20 0914  . hydrocerin (EUCERIN) cream   Topical BID Katha Cabal, [facility]   Given at 06/25/20 6546  . latanoprost (XALATAN) 0.005 % ophthalmic solution 1 drop  1 drop Both Eyes QHS Rayne Du, MD   1 drop at 06/24/20 2042  . levETIRAcetam (KEPPRA) tablet 500 mg  500 mg Oral BID Pauletta Browns, MD   500 mg at 06/25/20 0913  . liver oil-zinc oxide (DESITIN) 40 % ointment 1 application  1 application Topical BID Rayne Du, MD   1 application at 06/25/20 0914  . LORazepam (ATIVAN)  injection 1-2 mg  1-2 mg Intravenous Q2H PRN Rayne Du, MD      . magnesium hydroxide (MILK OF MAGNESIA) suspension 30 mL  30 mL Oral Once PRN Rayne Du, MD      . metoprolol succinate (TOPROL-XL) 24 hr tablet 12.5 mg  12.5 mg Oral Daily Rayne Du, MD   12.5 mg at 06/25/20 0913  . multivitamin with minerals tablet 1 tablet  1 tablet Oral Daily Rayne Du, MD   1 tablet at 06/25/20 0913  . nitroGLYCERIN (NITROSTAT) SL tablet 0.4 mg  0.4 mg Sublingual Q5 Min x 3 PRN Rayne Du, MD      . white petrolatum (VASELINE) gel 1 application  1 application Topical PRN Rayne Du, MD         Discharge Medications: TAKE these medications   acetaminophen 325 MG tablet Commonly known as: TYLENOL Take 2 tablets (650 mg total) by mouth every 4 (four) hours as needed for mild pain (or temp > 37.5 C (99.5 F)). What changed:   when to take this  additional instructions   aspirin 81 MG chewable tablet Chew 81 mg by mouth in the morning.   atorvastatin 40 MG tablet Commonly known as: LIPITOR Take 1 tablet (40 mg total) by mouth daily at 6 PM. What changed: when to take this   Aveeno Eczema Therapy 1 % Crea Generic drug: Colloidal Oatmeal Apply 1 application topically See admin instructions. Apply to affected areas 2 times a day   baclofen 10 MG tablet Commonly known as: LIORESAL Take 10 mg by mouth 3 (three) times daily.   brimonidine 0.2 % ophthalmic solution Commonly known as: ALPHAGAN Place 2 drops into both eyes 2 (two) times daily.   feeding supplement (PRO-STAT SUGAR FREE 64) Liqd Take 30 mLs by mouth daily. CHERRY   FLEET ENEMA RE Place 1 enema rectally once as needed (for constipation not relieved by prune juice and milk of magnesia protocol and call MD if no relief from enema).   hydrocortisone cream 1 % Apply 1 application topically 2 (two) times daily.   lactose free nutrition Liqd Take 237 mLs by mouth 3 (three) times daily. VANILLA    levETIRAcetam 500 MG tablet Commonly known as: KEPPRA Take 1 tablet (500 mg total) by mouth 2 (two) times daily.   loperamide 2 MG tablet Commonly known as: IMODIUM A-D Take 2 mg by mouth 4 (four) times daily as needed (after each loose stool- up to 4 doses in 12 hours and call MD if this diarrhea persists longer than 12 hours or is accompanied by severe abdominal pain).   metoprolol succinate 25 MG 24 hr tablet Commonly known as: TOPROL-XL Take 12.5 mg  by mouth daily.   Milk of Magnesia 400 MG/5ML suspension Generic drug: magnesium hydroxide Take 30 mLs by mouth once as needed for mild constipation.   multivitamins ther. w/minerals Tabs tablet Take 1 tablet by mouth daily.   Mylanta 200-200-20 MG/5ML suspension Generic drug: alum & mag hydroxide-simeth Take 30 mLs by mouth once as needed for indigestion or heartburn.   nitroGLYCERIN 0.4 MG SL tablet Commonly known as: NITROSTAT Place 0.4 mg under the tongue every 5 (five) minutes x 3 doses as needed for chest pain (AND CALL EMS IF NO RESOLUTION).   Pepto-Bismol 262 MG/15ML suspension Generic drug: bismuth subsalicylate Take 15 mLs by mouth every 2 (two) hours as needed for indigestion or diarrhea or loose stools (max of 6 doses in 24 hours).   ROBITUSSIN COUGH/COLD CF PO Take 10 mLs by mouth every 6 (six) hours as needed (for cough).   senna-docusate 8.6-50 MG tablet Commonly known as: Senokot-S Take 1 tablet by mouth at bedtime as needed for mild constipation.   Travoprost (BAK Free) 0.004 % Soln ophthalmic solution Commonly known as: TRAVATAN Place 1 drop into both eyes at bedtime.   white petrolatum Gel Commonly known as: VASELINE Apply 1 application topically as needed (to affected areas).   zinc oxide 20 % ointment Apply 1 application topically See admin instructions. Apply to affected areas 2 times a day and to the buttocks daily as needed for rash        Relevant Imaging  Results:  Relevant Lab Results:   Additional Information SS#: [id number]  Allayne Butcher, RN

## 2020-06-25 NOTE — Care Management Important Message (Signed)
Important Message  Patient Details  Name: Ronald Matthews MRN: [id number] Date of Birth: 1933-02-10   Medicare Important Message Given:  Yes  Initial Medicare IM given by Patient Access Associate on 06/24/2020 at 12:24pm.   Johnell Comings 06/25/2020, 8:37 AM

## 2020-06-25 NOTE — TOC Transition Note (Signed)
Transition of Care [facility]) - CM/SW Discharge Note   Patient Details  Name: Ronald Matthews MRN: [id number] Date of Birth: 19-Jul-1933  Transition of Care [facility] CM/SW Contact:  Allayne Butcher, RN Phone Number: 06/25/2020, 11:02 AM   Clinical Narrative:     Patient scheduled for pick up with [facility] for 1430 this afternoon.   Final next level of care: Home w Home Health Services Barriers to Discharge: Barriers Resolved   Patient Goals and CMS Choice   CMS Medicare.gov Compare Post Acute Care list provided to:: Patient Represenative (must comment) Choice offered to / list presented to : [facility] POA / Guardian ([facility])  Discharge Placement              Patient chooses bed at:  ([facility]) Patient to be transferred to facility by: [facility] Name of family member notified: Ronald Matthews Patient and family notified of of transfer: 06/25/20  Discharge Plan and Services   Discharge Planning Services: CM Consult                      HH Arranged: PT, OT, Speech Therapy HH Agency: [facility] Date [facility] Agency Contacted: 06/25/20 Time HH Agency Contacted: 1057 Representative spoke with at [facility] Agency: Cassie  Social Determinants of Health (SDOH) Interventions     Readmission Risk Interventions No flowsheet data found.

## 2020-06-25 NOTE — Discharge Summary (Signed)
Physician Discharge Summary  Ronald Matthews MAU:[id number] DOB: 22-Aug-1933 DOA: 06/22/2020  PCP: Galvin Proffer, MD  Admit date: 06/22/2020 Discharge date: 06/25/2020  Time spent: 50 minutes  Recommendations for Outpatient Follow-up:  1. Continue taking Keppra 500 p.o. twice daily   Discharge Diagnoses:  Principal Problem:   LOC (loss of consciousness) (HCC) Active Problems:   Dementia (HCC)   Seizure (HCC)   Discharge Condition: Stable  Diet recommendation: Heart healthy diet  Filed Weights   06/22/20 1000 06/24/20 0304  Weight: 90.7 kg 64.6 kg    History of present illness:  86 year old male with medical history of hypertension, CAD, previous CVA with left-sided weakness, dementia presents with episode of loss of consciousness.  Patient has history of seizures in the past had negative work-up including EEG in July 2021.  Patient also had a normal UA so started on IV ceftriaxone.  Urine culture obtained  Hospital Course:   1. Loss of consciousness-resolved, likely postictal from seizure.  EEG done in July 2021 was unremarkable.  Neurology was consulted and patient started on Keppra 500 mg p.o. twice daily.  No further episodes in the hospital.    2. ?  UTI-patient has a normal UA, started on IV ceftriaxone.  Urine culture growing mixed flora.  Ceftriaxone has been discontinued.  No antibiotics will be given at discharge. 3. Hyperlipidemia-continue atorvastatin. 4. Hypertension-continue metoprolol. 5. History of CVA with left-sided weakness-continue aspirin, atorvastatin. 6. History of CAD-continue aspirin, atorvastatin, metoprolol.  Procedures:  None  Consultations:    Discharge Exam: Vitals:   06/25/20 0854 06/25/20 1245  BP: 110/65 (!) 140/55  Pulse: 74 67  Resp: 20 18  Temp: 98.2 F (36.8 C) 98.3 F (36.8 C)  SpO2: 100% 99%    General: Appears in no acute distress Cardiovascular: S1-S2, regular Respiratory: Clear to auscultation bilaterally  Discharge  Instructions   Discharge Instructions    Diet - low sodium heart healthy   Complete by: As directed    Increase activity slowly   Complete by: As directed      Allergies as of 06/25/2020      Reactions   Cephalexin Other (See Comments)   Ppd [tuberculin Purified Protein Derivative] Other (See Comments)   "Allergic," per [facility]   Tuberculin Tests Other (See Comments)   "Allergic," per [facility]      Medication List    TAKE these medications   acetaminophen 325 MG tablet Commonly known as: TYLENOL Take 2 tablets (650 mg total) by mouth every 4 (four) hours as needed for mild pain (or temp > 37.5 C (99.5 F)). What changed:   when to take this  additional instructions   aspirin 81 MG chewable tablet Chew 81 mg by mouth in the morning.   atorvastatin 40 MG tablet Commonly known as: LIPITOR Take 1 tablet (40 mg total) by mouth daily at 6 PM. What changed: when to take this   Aveeno Eczema Therapy 1 % Crea Generic drug: Colloidal Oatmeal Apply 1 application topically See admin instructions. Apply to affected areas 2 times a day   baclofen 10 MG tablet Commonly known as: LIORESAL Take 10 mg by mouth 3 (three) times daily.   brimonidine 0.2 % ophthalmic solution Commonly known as: ALPHAGAN Place 2 drops into both eyes 2 (two) times daily.   feeding supplement (PRO-STAT SUGAR FREE 64) Liqd Take 30 mLs by mouth daily. CHERRY   FLEET ENEMA RE Place 1 enema rectally once as needed (for constipation not relieved by prune  juice and milk of magnesia protocol and call MD if no relief from enema).   hydrocortisone cream 1 % Apply 1 application topically 2 (two) times daily.   lactose free nutrition Liqd Take 237 mLs by mouth 3 (three) times daily. VANILLA   levETIRAcetam 500 MG tablet Commonly known as: KEPPRA Take 1 tablet (500 mg total) by mouth 2 (two) times daily.   loperamide 2 MG tablet Commonly known as: IMODIUM A-D Take 2 mg by mouth 4 (four) times daily as needed  (after each loose stool- up to 4 doses in 12 hours and call MD if this diarrhea persists longer than 12 hours or is accompanied by severe abdominal pain).   metoprolol succinate 25 MG 24 hr tablet Commonly known as: TOPROL-XL Take 12.5 mg by mouth daily.   Milk of Magnesia 400 MG/5ML suspension Generic drug: magnesium hydroxide Take 30 mLs by mouth once as needed for mild constipation.   multivitamins ther. w/minerals Tabs tablet Take 1 tablet by mouth daily.   Mylanta 200-200-20 MG/5ML suspension Generic drug: alum & mag hydroxide-simeth Take 30 mLs by mouth once as needed for indigestion or heartburn.   nitroGLYCERIN 0.4 MG SL tablet Commonly known as: NITROSTAT Place 0.4 mg under the tongue every 5 (five) minutes x 3 doses as needed for chest pain (AND CALL EMS IF NO RESOLUTION).   Pepto-Bismol 262 MG/15ML suspension Generic drug: bismuth subsalicylate Take 15 mLs by mouth every 2 (two) hours as needed for indigestion or diarrhea or loose stools (max of 6 doses in 24 hours).   ROBITUSSIN COUGH/COLD CF PO Take 10 mLs by mouth every 6 (six) hours as needed (for cough).   senna-docusate 8.6-50 MG tablet Commonly known as: Senokot-S Take 1 tablet by mouth at bedtime as needed for mild constipation.   Travoprost (BAK Free) 0.004 % Soln ophthalmic solution Commonly known as: TRAVATAN Place 1 drop into both eyes at bedtime.   white petrolatum Gel Commonly known as: VASELINE Apply 1 application topically as needed (to affected areas).   zinc oxide 20 % ointment Apply 1 application topically See admin instructions. Apply to affected areas 2 times a day and to the buttocks daily as needed for rash      Allergies  Allergen Reactions  . Cephalexin Other (See Comments)  . Ppd [Tuberculin Purified Protein Derivative] Other (See Comments)    "Allergic," per [facility]  . Tuberculin Tests Other (See Comments)    [facility]      The results of significant diagnostics from  this hospitalization (including imaging, microbiology, ancillary and laboratory) are listed below for reference.    Significant Diagnostic Studies: DG Elbow 2 Views Left  Result Date: 06/22/2020 CLINICAL DATA:  Fall EXAM: LEFT ELBOW - 2 VIEW COMPARISON:  04/17/2019 FINDINGS: Three view radiograph of the left elbow is slightly limited by suboptimal positioning. Despite this, there is normal alignment. No fracture or dislocation. No effusion. Subcutaneous calcific densities are again seen within the medial soft tissues IMPRESSION: No acute bony abnormality. Electronically Signed   By: Helyn NumbersAshesh  Parikh MD   On: 06/22/2020 19:49   CT Head Wo Contrast  Result Date: 06/22/2020 CLINICAL DATA:  86 year old male with seizure and possible head and neck injury. EXAM: CT HEAD WITHOUT CONTRAST CT CERVICAL SPINE WITHOUT CONTRAST TECHNIQUE: Multidetector CT imaging of the head and cervical spine was performed following the standard protocol without intravenous contrast. Multiplanar CT image reconstructions of the cervical spine were also generated. COMPARISON:  05/07/2020 head CT  and prior studies FINDINGS: CT HEAD FINDINGS Brain: No evidence of acute infarction, hemorrhage, hydrocephalus, extra-axial collection or mass lesion/mass effect. Moderate to severe chronic small-vessel white matter ischemic changes and remote RIGHT pontine infarct again noted. Vascular: Carotid atherosclerotic calcifications are noted. Skull: Normal. Negative for fracture or focal lesion. Sinuses/Orbits: No acute finding. Other: None. CT CERVICAL SPINE FINDINGS Alignment: Normal. Skull base and vertebrae: No acute fracture. No primary bone lesion or focal pathologic process. Soft tissues and spinal canal: No prevertebral fluid or swelling. No visible canal hematoma. Disc levels: Multilevel degenerative disc disease and facet arthropathy again noted. Degenerative disc disease/spondylosis is greatest at C3-4. Upper chest: No acute abnormality Other:  None IMPRESSION: 1. No evidence of acute intracranial abnormality. Moderate to severe chronic small-vessel white matter ischemic changes and remote RIGHT pontine infarct. 2. No static evidence of acute injury to the cervical spine. Electronically Signed   By: Harmon Pier M.D.   On: 06/22/2020 17:13   CT Cervical Spine Wo Contrast  Result Date: 06/22/2020 CLINICAL DATA:  86 year old male with seizure and possible head and neck injury. EXAM: CT HEAD WITHOUT CONTRAST CT CERVICAL SPINE WITHOUT CONTRAST TECHNIQUE: Multidetector CT imaging of the head and cervical spine was performed following the standard protocol without intravenous contrast. Multiplanar CT image reconstructions of the cervical spine were also generated. COMPARISON:  05/07/2020 head CT and prior studies FINDINGS: CT HEAD FINDINGS Brain: No evidence of acute infarction, hemorrhage, hydrocephalus, extra-axial collection or mass lesion/mass effect. Moderate to severe chronic small-vessel white matter ischemic changes and remote RIGHT pontine infarct again noted. Vascular: Carotid atherosclerotic calcifications are noted. Skull: Normal. Negative for fracture or focal lesion. Sinuses/Orbits: No acute finding. Other: None. CT CERVICAL SPINE FINDINGS Alignment: Normal. Skull base and vertebrae: No acute fracture. No primary bone lesion or focal pathologic process. Soft tissues and spinal canal: No prevertebral fluid or swelling. No visible canal hematoma. Disc levels: Multilevel degenerative disc disease and facet arthropathy again noted. Degenerative disc disease/spondylosis is greatest at C3-4. Upper chest: No acute abnormality Other: None IMPRESSION: 1. No evidence of acute intracranial abnormality. Moderate to severe chronic small-vessel white matter ischemic changes and remote RIGHT pontine infarct. 2. No static evidence of acute injury to the cervical spine. Electronically Signed   By: Harmon Pier M.D.   On: 06/22/2020 17:13     Microbiology: Recent Results (from the past 240 hour(s))  SARS Coronavirus 2 by RT PCR (hospital order, performed in [facility] hospital lab) Nasopharyngeal Nasopharyngeal Swab     Status: None   Collection Time: 06/22/20  7:34 PM   Specimen: Nasopharyngeal Swab  Result Value Ref Range Status   SARS Coronavirus 2 NEGATIVE NEGATIVE Final    Comment: (NOTE) SARS-CoV-2 target nucleic acids are NOT DETECTED.  The SARS-CoV-2 RNA is generally detectable in upper and lower respiratory specimens during the acute phase of infection. The lowest concentration of SARS-CoV-2 viral copies this assay can detect is 250 copies / mL. A negative result does not preclude SARS-CoV-2 infection and should not be used as the sole basis for treatment or other patient management decisions.  A negative result may occur with improper specimen collection / handling, submission of specimen other than nasopharyngeal swab, presence of viral mutation(s) within the areas targeted by this assay, and inadequate number of viral copies (<250 copies / mL). A negative result must be combined with clinical observations, patient history, and epidemiological information.  Fact Sheet for Patients:   BoilerBrush.com.cy  Fact Sheet for Healthcare Providers:  https://pope.com/  This test is not yet approved or  cleared by the [place] and has been authorized for detection and/or diagnosis of SARS-CoV-2 by FDA under an Emergency Use Authorization (EUA).  This EUA will remain in effect (meaning this test can be used) for the duration of the COVID-19 declaration under Section 564(b)(1) of the Act, 21 U.S.C. section 360bbb-3(b)(1), unless the authorization is terminated or revoked sooner.  Performed at [facility], [address] [place], [place] [address]   Urine culture     Status: Abnormal   Collection Time: 06/22/20 10:33 PM   Specimen: Urine,  Random  Result Value Ref Range Status   Specimen Description   Final    URINE, RANDOM Performed at [facility], [address], [place], [place] [address]    Special Requests   Final    NONE Performed at [facility], [address] [place], [place] [address]    Culture (A)  Final    >=100,000 COLONIES/mL MULTIPLE SPECIES PRESENT, SUGGEST RECOLLECTION   Report Status 06/25/2020 FINAL  Final     Labs: Basic Metabolic Panel: Recent Labs  Lab 06/22/20 1000 06/22/20 2338 06/24/20 0659  NA 138  --  136  K 3.9  --  4.4  CL 103  --  101  CO2 22  --  23  GLUCOSE 82  --  81  BUN 20  --  14  CREATININE 1.04 0.84 0.90  CALCIUM 9.1  --  8.8*   Liver Function Tests: Recent Labs  Lab 06/24/20 0659  AST 20  ALT 14  ALKPHOS 40  BILITOT 0.7  PROT 6.3*  ALBUMIN 3.5   No results for input(s): LIPASE, AMYLASE in the last 168 hours. No results for input(s): AMMONIA in the last 168 hours. CBC: Recent Labs  Lab 06/22/20 1000 06/22/20 2338 06/24/20 0659  WBC 5.2 5.4 4.4  HGB 10.9* 10.1* 9.8*  HCT 33.5* 30.0* 29.9*  MCV 85.2 83.1 84.0  PLT 252 216 226       Signed:  Meredeth Ide MD.  [facility] 06/25/2020, 1:27 PM

## 2020-06-25 NOTE — TOC Transition Note (Signed)
Transition of Care [facility]) - CM/SW Discharge Note   Patient Details  Name: Ronald Matthews MRN: [id number] Date of Birth: [birth year]  [facility] CM/SW Contact:  Allayne Butcher, RN Phone Number: 06/25/2020, 10:44 AM   Clinical Narrative:    Patient will discharge back to [facility] today.  Tammy at [facility] is aware of discharge.  Tammy requests EMS transport because patient is usually confined to a wheelchair and they are worried about his stability.  RNCM will arrange transport with [facility] transport.   Discharge summary and FL2 will be faxed to facility at [phone].  Patient's sister updated on discharge plan.  PT and OT recommended home health PT and OT and [facility] would like for the patient to also be set up with speech.  [facility] uses [facility] for home health services, Cassie with [facility] given home health referral for PT, OT, and speech.    Final next level of care: Home/Self Care Barriers to Discharge: Barriers Resolved   Patient Goals and CMS Choice        Discharge Placement              Patient chooses bed at:  ([facility]) Patient to be transferred to facility by: [facility] Name of family member notified: [name] Patient and family notified of of transfer: 06/25/20  Discharge Plan and Services   Discharge Planning Services: CM Consult                                 Social Determinants of Health (SDOH) Interventions     Readmission Risk Interventions No flowsheet data found.

## 2022-05-21 ENCOUNTER — Emergency Department: Payer: Medicare Other

## 2022-05-21 ENCOUNTER — Inpatient Hospital Stay
Admission: EM | Admit: 2022-05-21 | Discharge: 2022-05-26 | DRG: 871 | Disposition: A | Discharge status: Hospice | Payer: Medicare Other | Attending: Internal Medicine | Admitting: Internal Medicine

## 2022-05-21 DIAGNOSIS — A419 Sepsis, unspecified organism: Principal | ICD-10-CM | POA: Diagnosis present

## 2022-05-21 DIAGNOSIS — J9601 Acute respiratory failure with hypoxia: Secondary | ICD-10-CM | POA: Diagnosis present

## 2022-05-21 DIAGNOSIS — D649 Anemia, unspecified: Secondary | ICD-10-CM | POA: Diagnosis present

## 2022-05-21 DIAGNOSIS — E785 Hyperlipidemia, unspecified: Secondary | ICD-10-CM | POA: Diagnosis present

## 2022-05-21 DIAGNOSIS — I2510 Atherosclerotic heart disease of native coronary artery without angina pectoris: Secondary | ICD-10-CM | POA: Diagnosis present

## 2022-05-21 DIAGNOSIS — D638 Anemia in other chronic diseases classified elsewhere: Secondary | ICD-10-CM | POA: Diagnosis present

## 2022-05-21 DIAGNOSIS — W19XXXA Unspecified fall, initial encounter: Secondary | ICD-10-CM | POA: Diagnosis present

## 2022-05-21 DIAGNOSIS — I10 Essential (primary) hypertension: Secondary | ICD-10-CM | POA: Diagnosis present

## 2022-05-21 DIAGNOSIS — Z79899 Other long term (current) drug therapy: Secondary | ICD-10-CM

## 2022-05-21 DIAGNOSIS — R4182 Altered mental status, unspecified: Secondary | ICD-10-CM | POA: Diagnosis not present

## 2022-05-21 DIAGNOSIS — Z66 Do not resuscitate: Secondary | ICD-10-CM | POA: Diagnosis present

## 2022-05-21 DIAGNOSIS — R6521 Severe sepsis with septic shock: Secondary | ICD-10-CM | POA: Diagnosis present

## 2022-05-21 DIAGNOSIS — S0003XA Contusion of scalp, initial encounter: Secondary | ICD-10-CM | POA: Diagnosis present

## 2022-05-21 DIAGNOSIS — E860 Dehydration: Secondary | ICD-10-CM | POA: Diagnosis present

## 2022-05-21 DIAGNOSIS — Z87898 Personal history of other specified conditions: Secondary | ICD-10-CM

## 2022-05-21 DIAGNOSIS — Y92129 Unspecified place in nursing home as the place of occurrence of the external cause: Secondary | ICD-10-CM

## 2022-05-21 DIAGNOSIS — I214 Non-ST elevation (NSTEMI) myocardial infarction: Secondary | ICD-10-CM | POA: Diagnosis present

## 2022-05-21 DIAGNOSIS — G40909 Epilepsy, unspecified, not intractable, without status epilepticus: Secondary | ICD-10-CM | POA: Diagnosis present

## 2022-05-21 DIAGNOSIS — M79601 Pain in right arm: Secondary | ICD-10-CM | POA: Diagnosis present

## 2022-05-21 DIAGNOSIS — Z515 Encounter for palliative care: Secondary | ICD-10-CM

## 2022-05-21 DIAGNOSIS — I69354 Hemiplegia and hemiparesis following cerebral infarction affecting left non-dominant side: Secondary | ICD-10-CM

## 2022-05-21 DIAGNOSIS — G9341 Metabolic encephalopathy: Secondary | ICD-10-CM | POA: Diagnosis present

## 2022-05-21 DIAGNOSIS — F0390 Unspecified dementia without behavioral disturbance: Secondary | ICD-10-CM | POA: Diagnosis present

## 2022-05-21 DIAGNOSIS — J189 Pneumonia, unspecified organism: Secondary | ICD-10-CM | POA: Diagnosis present

## 2022-05-21 DIAGNOSIS — Z881 Allergy status to other antibiotic agents status: Secondary | ICD-10-CM

## 2022-05-21 DIAGNOSIS — N179 Acute kidney failure, unspecified: Secondary | ICD-10-CM | POA: Diagnosis present

## 2022-05-21 DIAGNOSIS — E872 Acidosis, unspecified: Secondary | ICD-10-CM

## 2022-05-21 DIAGNOSIS — Z7982 Long term (current) use of aspirin: Secondary | ICD-10-CM

## 2022-05-21 DIAGNOSIS — R569 Unspecified convulsions: Secondary | ICD-10-CM

## 2022-05-21 LAB — COMPREHENSIVE METABOLIC PANEL
ALT: 19 U/L (ref 0–44)
AST: 67 U/L — ABNORMAL HIGH (ref 15–41)
Albumin: 3.4 g/dL — ABNORMAL LOW (ref 3.5–5.0)
Alkaline Phosphatase: 84 U/L (ref 38–126)
Anion gap: 12 (ref 5–15)
BUN: 54 mg/dL — ABNORMAL HIGH (ref 8–23)
CO2: 20 mmol/L — ABNORMAL LOW (ref 22–32)
Calcium: 8.3 mg/dL — ABNORMAL LOW (ref 8.9–10.3)
Chloride: 105 mmol/L (ref 98–111)
Creatinine, Ser: 4.28 mg/dL — ABNORMAL HIGH (ref 0.61–1.24)
GFR, Estimated: 13 mL/min — ABNORMAL LOW (ref >60)
Glucose, Bld: 111 mg/dL — ABNORMAL HIGH (ref 70–99)
Potassium: 4.8 mmol/L (ref 3.5–5.1)
Sodium: 137 mmol/L (ref 135–145)
Total Bilirubin: 0.7 mg/dL (ref 0.3–1.2)
Total Protein: 7.7 g/dL (ref 6.5–8.1)

## 2022-05-21 LAB — CBC WITH DIFFERENTIAL/PLATELET
Abs Immature Granulocytes: 0.22 10*3/uL — ABNORMAL HIGH (ref 0.00–0.07)
Basophils Absolute: 0.1 10*3/uL (ref 0.0–0.1)
Basophils Relative: 0 %
Eosinophils Absolute: 0 10*3/uL (ref 0.0–0.5)
Eosinophils Relative: 0 %
HCT: 31.1 % — ABNORMAL LOW (ref 39.0–52.0)
Hemoglobin: 9.5 g/dL — ABNORMAL LOW (ref 13.0–17.0)
Immature Granulocytes: 1 %
Lymphocytes Relative: 4 %
Lymphs Abs: 0.9 10*3/uL (ref 0.7–4.0)
MCH: 26.8 pg (ref 26.0–34.0)
MCHC: 30.5 g/dL (ref 30.0–36.0)
MCV: 87.6 fL (ref 80.0–100.0)
Monocytes Absolute: 1.1 10*3/uL — ABNORMAL HIGH (ref 0.1–1.0)
Monocytes Relative: 4 %
Neutro Abs: 23.6 10*3/uL — ABNORMAL HIGH (ref 1.7–7.7)
Neutrophils Relative %: 91 %
Platelets: 272 10*3/uL (ref 150–400)
RBC: 3.55 MIL/uL — ABNORMAL LOW (ref 4.22–5.81)
RDW: 15.4 % (ref 11.5–15.5)
WBC: 25.9 10*3/uL — ABNORMAL HIGH (ref 4.0–10.5)
nRBC: 0 % (ref 0.0–0.2)

## 2022-05-21 LAB — LACTIC ACID, PLASMA: Lactic Acid, Venous: 4.2 mmol/L (ref 0.5–1.9)

## 2022-05-21 LAB — TROPONIN I (HIGH SENSITIVITY): Troponin I (High Sensitivity): 8576 ng/L (ref <18)

## 2022-05-21 LAB — CBG MONITORING, ED: Glucose-Capillary: 113 mg/dL — ABNORMAL HIGH (ref 70–99)

## 2022-05-21 MED ORDER — NALOXONE HCL 2 MG/2ML IJ SOSY
2.0000 mg | PREFILLED_SYRINGE | Freq: Once | INTRAMUSCULAR | Status: AC
  Administered 2022-05-21: 2 mg via INTRAVENOUS
  Filled 2022-05-21: qty 2

## 2022-05-21 MED ORDER — LACTATED RINGERS IV BOLUS
1000.0000 mL | Freq: Once | INTRAVENOUS | Status: AC
  Administered 2022-05-21: 1000 mL via INTRAVENOUS

## 2022-05-21 MED ORDER — VANCOMYCIN HCL IN DEXTROSE 1-5 GM/200ML-% IV SOLN
1000.0000 mg | Freq: Once | INTRAVENOUS | Status: AC
  Administered 2022-05-22: 1000 mg via INTRAVENOUS
  Filled 2022-05-21: qty 200

## 2022-05-21 MED ORDER — VANCOMYCIN HCL 1250 MG/250ML IV SOLN: 1250.0000 mg | Freq: Once | INTRAVENOUS | Status: DC

## 2022-05-21 MED ORDER — METRONIDAZOLE 500 MG/100ML IV SOLN
500.0000 mg | Freq: Once | INTRAVENOUS | Status: AC
  Administered 2022-05-21: 500 mg via INTRAVENOUS
  Filled 2022-05-21: qty 100

## 2022-05-21 MED ORDER — SODIUM CHLORIDE 0.9 % IV SOLN
2.0000 g | Freq: Once | INTRAVENOUS | Status: AC
  Administered 2022-05-21: 2 g via INTRAVENOUS
  Filled 2022-05-21: qty 12.5

## 2022-05-21 NOTE — H&P (Signed)
History and Physical    Ronald Matthews Seeney ZOX:[id number] DOB: 26-Dec-1932 DOA: 05/21/2022  PCP: Galvin ProfferHague, Imran P, MD    Patient coming from:  Hospice   Chief Complaint:  Altered mental status   HPI:  Ronald Matthews Lyness is a 88 y.o. male seen in ed with complaints of altered mental status. On initial presentation patient was agonal altered unable to provide HPI.  Since arrival Has received IV resuscitation and is currently stabilized with a blood pressure of 100/57. Patient reported to have acute mentioned mental status change today and was reportedly smoking outside in the heat wearing like a jacket and pants for 30 minutes. He had a fever of 104 at the facility apparently had a fall then as well . Hospice nurse contacted family who wanted conservative medical management , basically  want any noninvasive medical treatment including fluids and antibiotics nothing interventional no surgeries no intubation needed DNR . so it looks like he basically exam wise now he is got some fluids and his pressures come up.   Pt has past medical history of allergies to Keflex, PPD, heart disease, dementia, hypertension, thrombocytopenia, seizure disorder, syncope, cystitis.  ED Course:   Vitals:   05/22/22 0100 05/22/22 0130 05/22/22 0143 05/22/22 0143  BP: (!) 107/58 (!) 106/48    Pulse: (!) 41 (!) 56 93   Resp: 11 14 10    Temp:    98 F (36.7 C)  TempSrc:    Axillary  SpO2: (!) 85% (!) 77% 97%   In the emergency room patient is hypotensive, afebrile with a temp of 98.1, respirations of 11-19, heart rate of 76. O2 sats of 92% on room air. CMP shows creatinine of 4.28 which is acute, glucose of 111, BUN of 54, GFR of 13, AST of 67. Troponin of 8576 Lactic acid of 4.2 Leukocytosis of 25.9, hemoglobin of 9.5, platelets of 272. Chest xray shows Pneumonia. Head CT shows a right frontal scalp hematoma, unchanged ventricular dilatation out of proportion to sulcal prominence which may be due to central  atrophy or normal pressure hydrocephalus, moderate to advanced chronic small vessel ischemic changes, remote right cerebellar infarct, previous right pontine infarct. EKG shows junctional rhythm At 96 prolonged QT of 603.   Review of Systems:  Review of Systems  Unable to perform ROS: Mental status change   Past Medical History:  Diagnosis Date   Coronary artery disease    Dementia (HCC)    Hypertension    Thrombocytopenia (HCC)     No past surgical history on file.   reports that he has been smoking. He has never used smokeless tobacco. He reports that he does not drink alcohol and does not use drugs.  Allergies  Allergen Reactions   Cephalexin Other (See Comments)   Ppd [Tuberculin Purified Protein Derivative] Other (See Comments)    "Allergic," per MAR   Tuberculin Tests Other (See Comments)    "Allergic," per MAR    No family history on file.  Prior to Admission medications   Medication Sig Start Date End Date Taking? Authorizing Provider  acetaminophen (TYLENOL) 325 MG tablet Take 2 tablets (650 mg total) by mouth every 4 (four) hours as needed for mild pain (or temp > 37.5 C (99.5 F)). Patient taking differently: Take 650 mg by mouth See admin instructions. Take 650 mg by mouth three times a day and an additional 650 mg as needed for fever, headache, or discomfort 07/18/18   Elgergawy, Leana Roeawood S, MD  alum & mag hydroxide-simeth (  MYLANTA) 200-200-20 MG/5ML suspension Take 30 mLs by mouth once as needed for indigestion or heartburn.    [provider]  Amino Acids-Protein Hydrolys (FEEDING SUPPLEMENT, PRO-STAT SUGAR FREE 64,) LIQD Take 30 mLs by mouth daily. CHERRY    [provider]  aspirin 81 MG chewable tablet Chew 81 mg by mouth in the morning.    [provider]  atorvastatin (LIPITOR) 40 MG tablet Take 1 tablet (40 mg total) by mouth daily at 6 PM. Patient taking differently: Take 40 mg by mouth daily.  07/18/18   Elgergawy, Leana Roe, MD   baclofen (LIORESAL) 10 MG tablet Take 10 mg by mouth 3 (three) times daily.    [provider]  bismuth subsalicylate (PEPTO-BISMOL) 262 MG/15ML suspension Take 15 mLs by mouth every 2 (two) hours as needed for indigestion or diarrhea or loose stools (max of 6 doses in 24 hours).    [provider]  brimonidine (ALPHAGAN) 0.2 % ophthalmic solution Place 2 drops into both eyes 2 (two) times daily.    [provider]  Colloidal Oatmeal (AVEENO ECZEMA THERAPY) 1 % CREA Apply 1 application topically See admin instructions. Apply to affected areas 2 times a day    [provider]  hydrocortisone cream 1 % Apply 1 application topically 2 (two) times daily.    [provider]  lactose free nutrition (BOOST) LIQD Take 237 mLs by mouth 3 (three) times daily. VANILLA    [provider]  levETIRAcetam (KEPPRA) 500 MG tablet Take 1 tablet (500 mg total) by mouth 2 (two) times daily. 06/25/20   Meredeth Ide, MD  loperamide (IMODIUM A-D) 2 MG tablet Take 2 mg by mouth 4 (four) times daily as needed (after each loose stool- up to 4 doses in 12 hours and call MD if this diarrhea persists longer than 12 hours or is accompanied by severe abdominal pain).    [provider]  magnesium hydroxide (MILK OF MAGNESIA) 400 MG/5ML suspension Take 30 mLs by mouth once as needed for mild constipation.    [provider]  metoprolol succinate (TOPROL-XL) 25 MG 24 hr tablet Take 12.5 mg by mouth daily.     [provider]  Multiple Vitamins-Minerals (MULTIVITAMINS THER. W/MINERALS) TABS tablet Take 1 tablet by mouth daily.    [provider]  nitroGLYCERIN (NITROSTAT) 0.4 MG SL tablet Place 0.4 mg under the tongue every 5 (five) minutes x 3 doses as needed for chest pain (AND CALL EMS IF NO RESOLUTION).    [provider]  Phenylephrine-DM-GG (ROBITUSSIN COUGH/COLD CF PO) Take 10 mLs by mouth every 6 (six) hours as needed (for cough).     [provider]  senna-docusate (SENOKOT-S) 8.6-50 MG tablet Take 1 tablet by mouth at bedtime as needed for mild constipation. 04/19/19   Ramonita Lab, MD  Sodium Phosphates (FLEET ENEMA RE) Place 1 enema rectally once as needed (for constipation not relieved by prune juice and milk of magnesia protocol and call MD if no relief from enema).    [provider]  Travoprost, BAK Free, (TRAVATAN) 0.004 % SOLN ophthalmic solution Place 1 drop into both eyes at bedtime.    [provider]  white petrolatum (VASELINE) GEL Apply 1 application topically as needed (to affected areas).     [provider]  zinc oxide 20 % ointment Apply 1 application topically See admin instructions. Apply to affected areas 2 times a day and to the buttocks daily as needed  for rash    [provider]    Physical Exam: Vitals:   05/22/22 0100 05/22/22 0130 05/22/22 0143 05/22/22 0143  BP: (!) 107/58 (!) 106/48    Pulse: (!) 41 (!) 56 93   Resp: 11 14 10    Temp:    98 F (36.7 C)  TempSrc:    Axillary  SpO2: (!) 85% (!) 77% 97%    Physical Exam Vitals reviewed.  Constitutional:      Appearance: He is ill-appearing.  HENT:     Right Ear: External ear normal.     Left Ear: External ear normal.  Eyes:     Extraocular Movements: Extraocular movements intact.  Cardiovascular:     Rate and Rhythm: Normal rate and regular rhythm.     Pulses: Normal pulses.     Heart sounds: Normal heart sounds.  Pulmonary:     Effort: Pulmonary effort is normal.     Breath sounds: Normal breath sounds.  Abdominal:     General: Bowel sounds are normal. There is no distension.     Palpations: Abdomen is soft.     Tenderness: There is no abdominal tenderness. There is no guarding.  Musculoskeletal:     Right lower leg: No edema.     Left lower leg: No edema.  Neurological:     General: No focal deficit present.      Labs on Admission: I have personally reviewed following labs  and imaging studies BMET Recent Labs  Lab 05/21/22 2220  NA 137  K 4.8  CL 105  CO2 20*  BUN 54*  CREATININE 4.28*  GLUCOSE 111*   Electrolytes Recent Labs  Lab 05/21/22 2220  CALCIUM 8.3*   Sepsis Markers Recent Labs  Lab 05/21/22 2220 05/22/22 0005  LATICACIDVEN 4.2* 4.7*   ABG No results for input(s): "PHART", "PCO2ART", "PO2ART" in the last 168 hours. Liver Enzymes Recent Labs  Lab 05/21/22 2220  AST 67*  ALT 19  ALKPHOS 84  BILITOT 0.7  ALBUMIN 3.4*   Cardiac Enzymes No results for input(s): "TROPONINI", "PROBNP" in the last 168 hours. No results found for: "DDIMER" Coag's No results for input(s): "APTT", "INR" in the last 168 hours.  No results found for this or any previous visit (from the past 240 hour(s)).   Current Facility-Administered Medications:    0.9 %  sodium chloride infusion, , Intravenous, Continuous, 05/23/22, Allena Katz, MD   acetaminophen (TYLENOL) tablet 650 mg, 650 mg, Oral, Q6H PRN **OR** acetaminophen (TYLENOL) suppository 650 mg, 650 mg, Rectal, Q6H PRN, Eliezer Mccoy, MD   aspirin chewable tablet 81 mg, 81 mg, Oral, q AM, Gertha Calkin, Allena Katz, MD   atorvastatin (LIPITOR) tablet 40 mg, 40 mg, Oral, Daily, Harveen Flesch V, MD   brimonidine (ALPHAGAN) 0.2 % ophthalmic solution 2 drop, 2 drop, Both Eyes, BID, Ora Bollig V, MD   Colloidal Oatmeal 1 % CREA 1 application , 1 application , Apply externally, See admin instructions, Eliezer Mccoy, MD   latanoprost (XALATAN) 0.005 % ophthalmic solution 1 drop, 1 drop, Both Eyes, QHS, Dineen Conradt V, MD   levETIRAcetam (KEPPRA) IVPB 500 mg/100 mL premix, 500 mg, Intravenous, Q12H, Gertha Calkin, Raeleen Winstanley V, MD   metoprolol succinate (TOPROL-XL) 24 hr tablet 12.5 mg, 12.5 mg, Oral, Daily, Armando Bukhari V, MD   metroNIDAZOLE (FLAGYL) IVPB 500 mg, 500 mg, Intravenous, Q12H, Sandrine Bloodsworth V, MD   morphine (PF) 2 MG/ML injection 2 mg, 2 mg, Intravenous, Q6H PRN, Allena Katz,  MD   nitroGLYCERIN (NITROSTAT) SL tablet 0.4 mg,  0.4 mg, Sublingual, Q5 Min x 3 PRN, Allena Katz, Leann Mayweather V, MD   sodium chloride flush (NS) 0.9 % injection 3 mL, 3 mL, Intravenous, Q12H, Allena Katz, Eliezer Mccoy, MD   vancomycin (VANCOCIN) IVPB 1000 mg/200 mL premix, 1,000 mg, Intravenous, Once, Irena Cords V, MD, Last Rate: 200 mL/hr at 05/22/22 0122, 1,000 mg at 05/22/22 0122  Current Outpatient Medications:    acetaminophen (TYLENOL) 325 MG tablet, Take 2 tablets (650 mg total) by mouth every 4 (four) hours as needed for mild pain (or temp > 37.5 C (99.5 F)). (Patient taking differently: Take 650 mg by mouth See admin instructions. Take 650 mg by mouth three times a day and an additional 650 mg as needed for fever, headache, or discomfort), Disp: , Rfl:    alum & mag hydroxide-simeth (MYLANTA) 200-200-20 MG/5ML suspension, Take 30 mLs by mouth once as needed for indigestion or heartburn., Disp: , Rfl:    Amino Acids-Protein Hydrolys (FEEDING SUPPLEMENT, PRO-STAT SUGAR FREE 64,) LIQD, Take 30 mLs by mouth daily. CHERRY, Disp: , Rfl:    aspirin 81 MG chewable tablet, Chew 81 mg by mouth in the morning., Disp: , Rfl:    atorvastatin (LIPITOR) 40 MG tablet, Take 1 tablet (40 mg total) by mouth daily at 6 PM. (Patient taking differently: Take 40 mg by mouth daily. ), Disp: , Rfl:    baclofen (LIORESAL) 10 MG tablet, Take 10 mg by mouth 3 (three) times daily., Disp: , Rfl:    bismuth subsalicylate (PEPTO-BISMOL) 262 MG/15ML suspension, Take 15 mLs by mouth every 2 (two) hours as needed for indigestion or diarrhea or loose stools (max of 6 doses in 24 hours)., Disp: , Rfl:    brimonidine (ALPHAGAN) 0.2 % ophthalmic solution, Place 2 drops into both eyes 2 (two) times daily., Disp: , Rfl:    Colloidal Oatmeal (AVEENO ECZEMA THERAPY) 1 % CREA, Apply 1 application topically See admin instructions. Apply to affected areas 2 times a day, Disp: , Rfl:    hydrocortisone cream 1 %, Apply 1 application topically 2 (two) times daily., Disp: , Rfl:    lactose free nutrition (BOOST)  LIQD, Take 237 mLs by mouth 3 (three) times daily. VANILLA, Disp: , Rfl:    levETIRAcetam (KEPPRA) 500 MG tablet, Take 1 tablet (500 mg total) by mouth 2 (two) times daily., Disp: 60 tablet, Rfl: 2   loperamide (IMODIUM A-D) 2 MG tablet, Take 2 mg by mouth 4 (four) times daily as needed (after each loose stool- up to 4 doses in 12 hours and call MD if this diarrhea persists longer than 12 hours or is accompanied by severe abdominal pain)., Disp: , Rfl:    magnesium hydroxide (MILK OF MAGNESIA) 400 MG/5ML suspension, Take 30 mLs by mouth once as needed for mild constipation., Disp: , Rfl:    metoprolol succinate (TOPROL-XL) 25 MG 24 hr tablet, Take 12.5 mg by mouth daily. , Disp: , Rfl:    Multiple Vitamins-Minerals (MULTIVITAMINS THER. W/MINERALS) TABS tablet, Take 1 tablet by mouth daily., Disp: , Rfl:    nitroGLYCERIN (NITROSTAT) 0.4 MG SL tablet, Place 0.4 mg under the tongue every 5 (five) minutes x 3 doses as needed for chest pain (AND CALL EMS IF NO RESOLUTION)., Disp: , Rfl:    Phenylephrine-DM-GG (ROBITUSSIN COUGH/COLD CF PO), Take 10 mLs by mouth every 6 (six) hours as needed (for cough)., Disp: , Rfl:    senna-docusate (SENOKOT-S) 8.6-50 MG tablet, Take  1 tablet by mouth at bedtime as needed for mild constipation., Disp:  , Rfl:    Sodium Phosphates (FLEET ENEMA RE), Place 1 enema rectally once as needed (for constipation not relieved by prune juice and milk of magnesia protocol and call MD if no relief from enema)., Disp: , Rfl:    Travoprost, BAK Free, (TRAVATAN) 0.004 % SOLN ophthalmic solution, Place 1 drop into both eyes at bedtime., Disp: , Rfl:    white petrolatum (VASELINE) GEL, Apply 1 application topically as needed (to affected areas). , Disp: , Rfl:    zinc oxide 20 % ointment, Apply 1 application topically See admin instructions. Apply to affected areas 2 times a day and to the buttocks daily as needed for rash, Disp: , Rfl:   COVID-19 Labs No results for input(s): "DDIMER",  "FERRITIN", "LDH", "CRP" in the last 72 hours. Lab Results  Component Value Date   SARSCOV2NAA NEGATIVE 06/22/2020   SARSCOV2NAA NEGATIVE 11/28/2019   SARSCOV2NAA NEGATIVE 04/17/2019    Radiological Exams on Admission: CT HEAD WO CONTRAST ( )  Result Date: 05/21/2022 CLINICAL DATA:  Mental status change, unknown cause EXAM: CT HEAD WITHOUT CONTRAST TECHNIQUE: Contiguous axial images were obtained from the base of the skull through the vertex without intravenous contrast. RADIATION DOSE REDUCTION: This exam was performed according to the departmental dose-optimization program which includes automated exposure control, adjustment of the mA and/or kV according to patient size and/or use of iterative reconstruction technique. COMPARISON:  Head CT 06/22/2020 FINDINGS: Brain: Despite repeat acquisition there is moderate motion artifact. Allowing for motion, no evidence of acute intracranial hemorrhage. There is ventricular dilatation out of proportion to sulcal prominence, however this is stable from prior exam. Moderate to advanced periventricular chronic small vessel ischemic change. Prominent cerebellar atrophy. 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 There is no subdural or extra-axial collection. Vascular: Atherosclerosis of skullbase vasculature without hyperdense vessel or abnormal calcification. Skull: No fracture or focal lesion. Sinuses/Orbits: Paranasal sinuses and mastoid air cells are clear. The visualized orbits are unremarkable. Other: Right frontal scalp hematoma. IMPRESSION: 1. Allowing for motion artifact, no acute intracranial abnormality. 2. Right frontal scalp hematoma. 3. Unchanged ventricular dilatation out of proportion to sulcal prominence, may be due to central atrophy or normal pressure hydrocephalus. 4. Moderate to advanced chronic small vessel ischemic change. 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  Electronically Signed   By: Narda Rutherford M.D.   On: 05/21/2022 23:10   DG Chest Portable 1 View  Result Date: 05/21/2022 CLINICAL DATA:  Short of breath EXAM: PORTABLE CHEST 1 VIEW COMPARISON:  11/27/2019 FINDINGS: Single frontal view of the chest demonstrates a stable cardiac silhouette. Continued ectasia of the thoracic aorta. Stable calcified right basilar nodule. There is patchy bilateral perihilar airspace disease greatest in the left upper and right mid lung zones. No effusion or pneumothorax. No acute bony abnormality. IMPRESSION: 1. Patchy multifocal bilateral airspace disease as above, favor pneumonia over edema. Electronically Signed   By: Sharlet Salina M.D.   On: 05/21/2022 21:25    EKG: Independently reviewed.  Junctional rhythm as described above.   Assessment and Plan: * Altered mental status Patient is encephalopathic secondary to severe sepsis, pneumonia, NSTEMI. Discussed with sister Ms. [name] on the phone about guarded prognosis and uncertainty about patient making through tonight.  Ms. Seward Grater said that she will come in the morning.  She has nobody to bring her in the night.  She saw her brother  about 2 weeks ago.    Severe sepsis with septic shock (HCC) Attribute to pneumonia. We will continue patient on normal saline. We will continue patient on vancomycin cefepime and Flagyl with pharmacy consult. Again prognosis guarded. Cautious resuscitation with IV fluids as patient has history of heart disease. Patient has received 2 L of LR in the emergency room. We will continue patient on normal saline at 75 cc's per hour. Vitals:   05/21/22 2235 05/21/22 2313 05/21/22 2315 05/21/22 2330  BP: (!) 75/46 103/69 113/70 (!) 100/57   05/21/22 2345 05/22/22 0000 05/22/22 0005 05/22/22 0013  BP: (!) 102/55 (!) 74/44 (!) 69/44 (!) 83/55   05/22/22 0015 05/22/22 0030 05/22/22 0100 05/22/22 0130  BP: (!) 71/49 (!) 69/50 (!) 107/58 (!) 106/48       Community acquired  pneumonia Continue patient on vancomycin cefepime and Flagyl for community-acquired pneumonia.   NSTEMI (non-ST elevated myocardial infarction) [facility]) NSTEMI with a rising troponin of  Latest Reference Range & Units 05/21/22 22:20 05/21/22 22:58  Troponin I (High Sensitivity) <18 ng/L 8,576 (HH) 9,403 (HH)  Antiplatelet therapy currently held due to patient's anemia. Beta-blocker held due to hypotension. Aspirin held due to anemia. Statin held as patient is currently altered and NPO. Suspect combination of NSTEMI as patient has a history of CAD, and sepsis related troponin elevation. Conservative measures after discussing with sister cardiology consult if desired by family member. >>Patient's last echocardiogram shows:  1. Severe dyskinesis of the left ventricular, basal inferoseptal wall.  2. The left ventricle has normal systolic function, with an ejection  fraction of 55-60%. The cavity size was normal. There is moderate to  severe focal basal hypertrophy of the septum with otherwise mild left  ventricular wall thickening. Left ventricular   diastolic Doppler parameters are consistent with impaired relaxation.   3. The right ventricle has normal systolic function. The cavity was  normal. There is mildly increased right ventricular wall thickness. Right  ventricular systolic pressure could not be assessed.   4. The mitral valve is degenerative. Mild thickening of the mitral valve  leaflet.   5. The aortic valve is tricuspid. Mild thickening of the aortic valve.  Mild calcification of the aortic valve. Aortic valve regurgitation is  trivial by color flow Doppler.   6. The aortic root is normal in size and structure.   7. The interatrial septum was not well visualized.   Seizure (HCC) Keppra changed to IV. Seizure precautions.   Anemia Type and screen. IV ppi. Transfuse as deemed appropriate with volume overload.   Acute respiratory failure with hypoxia (HCC) Secondary to  pneumonia. Supportive care with supplemental oxygen. Respiratory therapy consult to assess every 4 hours and DuoNeb therapy as deemed appropriate.     DVT prophylaxis:  SCD's   Code Status:  DNR.   Family Communication:  Loftin,[name] (Sister)  [phone] (Mobile)   Disposition Plan:  Hospice.   Consults called:  None   Admission status: Observation.        Gertha Calkin MD [facility]  6 PM- 2 AM. Please contact me via secure Chat 6 PM-2 AM. [phone] ( Pager ) To contact the [facility] Attending or Consulting provider 7A - 7P or covering provider during after hours 7P -7A, for this patient.   Check the care team in [facility] and look for a) attending/consulting [facility] provider listed and b) the [facility] team listed Log into [email or web address] and use [facility]'s universal password to access. If you do not  have the password, please contact the hospital operator. Locate the [facility] provider you are looking for under [facility] and page to a number that you can be directly reached. If you still have difficulty reaching the provider, please page the [facility] (Director on Call) for the Hospitalists listed on amion for assistance. [email or web address] 05/22/2022, 2:04 AM

## 2022-05-21 NOTE — H&P (Incomplete)
History and Physical    Ronald Matthews DHR:[id number] DOB: 26-Aug-1933 DOA: 05/21/2022  PCP: Galvin Proffer, MD    Patient coming from:  ***   Chief Complaint:  ***   HPI:  Ronald Matthews is a 88 y.o. male seen in ed with complaints of ***    He is crying on the door but he is a patient that is all.  Currently getting care to meet with the restorative an 88 year old has a history of underlying dementia he is in one of the facilities and is on  Currently is likely functional at baseline and had an acute change in mental status today after he was smoking outside in the heat wearing like a jacket and pants for 30 minutes a minute temperature would like 104 at the facility apparently had a fall then as well still had a hospice nurse talk to her GERD and he came in Leg basically almost agonal like very hypotensive and she was like with the being so acute and him being so functional before did not feel comfortable despite keeping in there and doing comfort measures and she called the daughter and the sister who is the POA and they would basically want any noninvasive medical treatment including fluids and antibiotics nothing interventional no surgeries no intubation needed DNR so it looks like he basically exam wise now he is got some fluids and his pressures come up a bit he is groaning a little bit but he any withdrawal does not do anything purposeful does not open his eyes his pupils are pinpoint when he got here I tried to Narcan but he did not have any response to that he is got a lactic acidosis leukocytosis elevated troponin quite elevated troponin 9000 is not hypoxic he does have a count of a rattling cough and chest x-ray maybe has pneumonia again and broad-spectrum antibiotics and is getting fluids the pressure is coming up but again nothing nothing invasive no pressors or anything Interesting disease the most former no PSA actually really been set up at bedside and related.  Would like to  him being outside with a temperature of 104 is not febrile here for hypothermic here Excell Seltzer is a DO NOT RESUSCITATE but I do not see a MOST form who has the contact information is in the chart that she is the POA   Pt has past medical history of ***     ED Course:   Vitals:   05/21/22 2235 05/21/22 2313 05/21/22 2315 05/21/22 2330  BP: (!) 75/46 103/69 113/70 (!) 100/57  Pulse:      Resp:   13 13  Temp:      TempSrc:      SpO2:       ***  Review of Systems:  ROS    Past Medical History:  Diagnosis Date  . Coronary artery disease   . Dementia (HCC)   . Hypertension   . Thrombocytopenia (HCC)     No past surgical history on file.   reports that he has been smoking. He has never used smokeless tobacco. He reports that he does not drink alcohol and does not use drugs.  Allergies  Allergen Reactions  . Cephalexin Other (See Comments)  . Ppd [Tuberculin Purified Protein Derivative] Other (See Comments)    "Allergic," per MAR  . Tuberculin Tests Other (See Comments)    "Allergic," per MAR    No family history on file.  Prior to Admission medications   Medication Sig  Start Date End Date Taking? Authorizing Provider  acetaminophen (TYLENOL) 325 MG tablet Take 2 tablets (650 mg total) by mouth every 4 (four) hours as needed for mild pain (or temp > 37.5 C (99.5 F)). Patient taking differently: Take 650 mg by mouth See admin instructions. Take 650 mg by mouth three times a day and an additional 650 mg as needed for fever, headache, or discomfort 07/18/18   Elgergawy, Leana Roe, MD  alum & mag hydroxide-simeth (MYLANTA) 200-200-20 MG/5ML suspension Take 30 mLs by mouth once as needed for indigestion or heartburn.    [provider]  Amino Acids-Protein Hydrolys (FEEDING SUPPLEMENT, PRO-STAT SUGAR FREE 64,) LIQD Take 30 mLs by mouth daily. CHERRY    [provider]  aspirin 81 MG chewable tablet Chew 81 mg by mouth in the morning.    [provider]   atorvastatin (LIPITOR) 40 MG tablet Take 1 tablet (40 mg total) by mouth daily at 6 PM. Patient taking differently: Take 40 mg by mouth daily.  07/18/18   Elgergawy, Leana Roe, MD  baclofen (LIORESAL) 10 MG tablet Take 10 mg by mouth 3 (three) times daily.    [provider]  bismuth subsalicylate (PEPTO-BISMOL) 262 MG/15ML suspension Take 15 mLs by mouth every 2 (two) hours as needed for indigestion or diarrhea or loose stools (max of 6 doses in 24 hours).    [provider]  brimonidine (ALPHAGAN) 0.2 % ophthalmic solution Place 2 drops into both eyes 2 (two) times daily.    [provider]  Colloidal Oatmeal (AVEENO ECZEMA THERAPY) 1 % CREA Apply 1 application topically See admin instructions. Apply to affected areas 2 times a day    [provider]  hydrocortisone cream 1 % Apply 1 application topically 2 (two) times daily.    [provider]  lactose free nutrition (BOOST) LIQD Take 237 mLs by mouth 3 (three) times daily. VANILLA    [provider]  levETIRAcetam (KEPPRA) 500 MG tablet Take 1 tablet (500 mg total) by mouth 2 (two) times daily. 06/25/20   Meredeth Ide, MD  loperamide (IMODIUM A-D) 2 MG tablet Take 2 mg by mouth 4 (four) times daily as needed (after each loose stool- up to 4 doses in 12 hours and call MD if this diarrhea persists longer than 12 hours or is accompanied by severe abdominal pain).    [provider]  magnesium hydroxide (MILK OF MAGNESIA) 400 MG/5ML suspension Take 30 mLs by mouth once as needed for mild constipation.    [provider]  metoprolol succinate (TOPROL-XL) 25 MG 24 hr tablet Take 12.5 mg by mouth daily.     [provider]  Multiple Vitamins-Minerals (MULTIVITAMINS THER. W/MINERALS) TABS tablet Take 1 tablet by mouth daily.    [provider]  nitroGLYCERIN (NITROSTAT) 0.4 MG SL tablet Place 0.4 mg under the tongue every 5 (five) minutes x 3 doses as needed for chest  pain (AND CALL EMS IF NO RESOLUTION).    [provider]  Phenylephrine-DM-GG (ROBITUSSIN COUGH/COLD CF PO) Take 10 mLs by mouth every 6 (six) hours as needed (for cough).    [provider]  senna-docusate (SENOKOT-S) 8.6-50 MG tablet Take 1 tablet by mouth at bedtime as needed for mild constipation. 04/19/19   Ramonita Lab, MD  Sodium Phosphates (FLEET ENEMA RE) Place 1 enema rectally once as needed (for constipation not relieved by prune juice and milk of magnesia protocol and call MD if no relief  from enema).    [provider]  Travoprost, BAK Free, (TRAVATAN) 0.004 % SOLN ophthalmic solution Place 1 drop into both eyes at bedtime.    [provider]  white petrolatum (VASELINE) GEL Apply 1 application topically as needed (to affected areas).     [provider]  zinc oxide 20 % ointment Apply 1 application topically See admin instructions. Apply to affected areas 2 times a day and to the buttocks daily as needed for rash    [provider]    Physical Exam: Vitals:   05/21/22 2235 05/21/22 2313 05/21/22 2315 05/21/22 2330  BP: (!) 75/46 103/69 113/70 (!) 100/57  Pulse:      Resp:   13 13  Temp:      TempSrc:      SpO2:       Physical Exam   Labs on Admission: I have personally reviewed following labs and imaging studies BMET Recent Labs  Lab 05/21/22 2220  NA 137  K 4.8  CL 105  CO2 20*  BUN 54*  CREATININE 4.28*  GLUCOSE 111*   Electrolytes Recent Labs  Lab 05/21/22 2220  CALCIUM 8.3*   Sepsis Markers Recent Labs  Lab 05/21/22 2220  LATICACIDVEN 4.2*   ABG No results for input(s): "PHART", "PCO2ART", "PO2ART" in the last 168 hours. Liver Enzymes Recent Labs  Lab 05/21/22 2220  AST 67*  ALT 19  ALKPHOS 84  BILITOT 0.7  ALBUMIN 3.4*   Cardiac Enzymes No results for input(s): "TROPONINI", "PROBNP" in the last 168 hours. No results found for: "DDIMER" Coag's No results for input(s): "APTT", "INR" in  the last 168 hours.  No results found for this or any previous visit (from the past 240 hour(s)).   Current Facility-Administered Medications:  .  ceFEPIme (MAXIPIME) 2 g in sodium chloride 0.9 % 100 mL IVPB, 2 g, Intravenous, Once, Georga HackingMcHugh, Kelly Rose, MD, Last Rate: 200 mL/hr at 05/21/22 2349, 2 g at 05/21/22 2349 .  metroNIDAZOLE (FLAGYL) IVPB 500 mg, 500 mg, Intravenous, Once, Sidney AceMcHugh, Teddy SpikeKelly Rose, MD .  vancomycin (VANCOCIN) IVPB 1000 mg/200 mL premix, 1,000 mg, Intravenous, Once, Angelique BlonderMerrill, Kristin A, [facility]  Current Outpatient Medications:  .  acetaminophen (TYLENOL) 325 MG tablet, Take 2 tablets (650 mg total) by mouth every 4 (four) hours as needed for mild pain (or temp > 37.5 C (99.5 F)). (Patient taking differently: Take 650 mg by mouth See admin instructions. Take 650 mg by mouth three times a day and an additional 650 mg as needed for fever, headache, or discomfort), Disp: , Rfl:  .  alum & mag hydroxide-simeth (MYLANTA) 200-200-20 MG/5ML suspension, Take 30 mLs by mouth once as needed for indigestion or heartburn., Disp: , Rfl:  .  Amino Acids-Protein Hydrolys (FEEDING SUPPLEMENT, PRO-STAT SUGAR FREE 64,) LIQD, Take 30 mLs by mouth daily. CHERRY, Disp: , Rfl:  .  aspirin 81 MG chewable tablet, Chew 81 mg by mouth in the morning., Disp: , Rfl:  .  atorvastatin (LIPITOR) 40 MG tablet, Take 1 tablet (40 mg total) by mouth daily at 6 PM. (Patient taking differently: Take 40 mg by mouth daily. ), Disp: , Rfl:  .  baclofen (LIORESAL) 10 MG tablet, Take 10 mg by mouth 3 (three) times daily., Disp: , Rfl:  .  bismuth subsalicylate (PEPTO-BISMOL) 262 MG/15ML suspension, Take 15 mLs by mouth every 2 (two) hours as needed for indigestion or diarrhea or loose stools (max of 6 doses in 24 hours)., Disp: ,  Rfl:  .  brimonidine (ALPHAGAN) 0.2 % ophthalmic solution, Place 2 drops into both eyes 2 (two) times daily., Disp: , Rfl:  .  Colloidal Oatmeal (AVEENO ECZEMA THERAPY) 1 % CREA, Apply 1 application  topically See admin instructions. Apply to affected areas 2 times a day, Disp: , Rfl:  .  hydrocortisone cream 1 %, Apply 1 application topically 2 (two) times daily., Disp: , Rfl:  .  lactose free nutrition (BOOST) LIQD, Take 237 mLs by mouth 3 (three) times daily. VANILLA, Disp: , Rfl:  .  levETIRAcetam (KEPPRA) 500 MG tablet, Take 1 tablet (500 mg total) by mouth 2 (two) times daily., Disp: 60 tablet, Rfl: 2 .  loperamide (IMODIUM A-D) 2 MG tablet, Take 2 mg by mouth 4 (four) times daily as needed (after each loose stool- up to 4 doses in 12 hours and call MD if this diarrhea persists longer than 12 hours or is accompanied by severe abdominal pain)., Disp: , Rfl:  .  magnesium hydroxide (MILK OF MAGNESIA) 400 MG/5ML suspension, Take 30 mLs by mouth once as needed for mild constipation., Disp: , Rfl:  .  metoprolol succinate (TOPROL-XL) 25 MG 24 hr tablet, Take 12.5 mg by mouth daily. , Disp: , Rfl:  .  Multiple Vitamins-Minerals (MULTIVITAMINS THER. W/MINERALS) TABS tablet, Take 1 tablet by mouth daily., Disp: , Rfl:  .  nitroGLYCERIN (NITROSTAT) 0.4 MG SL tablet, Place 0.4 mg under the tongue every 5 (five) minutes x 3 doses as needed for chest pain (AND CALL EMS IF NO RESOLUTION)., Disp: , Rfl:  .  Phenylephrine-DM-GG (ROBITUSSIN COUGH/COLD CF PO), Take 10 mLs by mouth every 6 (six) hours as needed (for cough)., Disp: , Rfl:  .  senna-docusate (SENOKOT-S) 8.6-50 MG tablet, Take 1 tablet by mouth at bedtime as needed for mild constipation., Disp:  , Rfl:  .  Sodium Phosphates (FLEET ENEMA RE), Place 1 enema rectally once as needed (for constipation not relieved by prune juice and milk of magnesia protocol and call MD if no relief from enema)., Disp: , Rfl:  .  Travoprost, BAK Free, (TRAVATAN) 0.004 % SOLN ophthalmic solution, Place 1 drop into both eyes at bedtime., Disp: , Rfl:  .  white petrolatum (VASELINE) GEL, Apply 1 application topically as needed (to affected areas). , Disp: , Rfl:  .  zinc  oxide 20 % ointment, Apply 1 application topically See admin instructions. Apply to affected areas 2 times a day and to the buttocks daily as needed for rash, Disp: , Rfl:   COVID-19 Labs No results for input(s): "DDIMER", "FERRITIN", "LDH", "CRP" in the last 72 hours. Lab Results  Component Value Date   SARSCOV2NAA NEGATIVE 06/22/2020   SARSCOV2NAA NEGATIVE 11/28/2019   SARSCOV2NAA NEGATIVE 04/17/2019    Radiological Exams on Admission: CT HEAD WO CONTRAST ( )  Result Date: 05/21/2022 CLINICAL DATA:  Mental status change, unknown cause EXAM: CT HEAD WITHOUT CONTRAST TECHNIQUE: Contiguous axial images were obtained from the base of the skull through the vertex without intravenous contrast. RADIATION DOSE REDUCTION: This exam was performed according to the departmental dose-optimization program which includes automated exposure control, adjustment of the mA and/or kV according to patient size and/or use of iterative reconstruction technique. COMPARISON:  Head CT 06/22/2020 FINDINGS: Brain: Despite repeat acquisition there is moderate motion artifact. Allowing for motion, no evidence of acute intracranial hemorrhage. There is ventricular dilatation out of proportion to sulcal prominence, however this is stable from prior exam. Moderate to advanced periventricular chronic small  vessel ischemic change. Prominent cerebellar atrophy. 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 There is no subdural or extra-axial collection. Vascular: Atherosclerosis of skullbase vasculature without hyperdense vessel or abnormal calcification. Skull: No fracture or focal lesion. Sinuses/Orbits: Paranasal sinuses and mastoid air cells are clear. The visualized orbits are unremarkable. Other: Right frontal scalp hematoma. IMPRESSION: 1. Allowing for motion artifact, no acute intracranial abnormality. 2. Right frontal scalp hematoma. 3. Unchanged ventricular dilatation out of proportion to  sulcal prominence, may be due to central atrophy or normal pressure hydrocephalus. 4. Moderate to advanced chronic small vessel ischemic change. 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 Electronically Signed   By: Narda Rutherford M.D.   On: 05/21/2022 23:10   DG Chest Portable 1 View  Result Date: 05/21/2022 CLINICAL DATA:  Short of breath EXAM: PORTABLE CHEST 1 VIEW COMPARISON:  11/27/2019 FINDINGS: Single frontal view of the chest demonstrates a stable cardiac silhouette. Continued ectasia of the thoracic aorta. Stable calcified right basilar nodule. There is patchy bilateral perihilar airspace disease greatest in the left upper and right mid lung zones. No effusion or pneumothorax. No acute bony abnormality. IMPRESSION: 1. Patchy multifocal bilateral airspace disease as above, favor pneumonia over edema. Electronically Signed   By: Sharlet Salina M.D.   On: 05/21/2022 21:25    EKG: Independently reviewed.  ***   Assessment and Plan: No notes have been filed under this hospital service. Service: Hospitalist      ***     DVT prophylaxis:  ***   Code Status:  ***   Family Communication:  ***   Disposition Plan:  ***   Consults called:  ***  Admission status: ***       Gertha Calkin MD [facility]  6 PM- 2 AM. Please contact me via secure Chat 6 PM-2 AM. [phone] ( Pager ) To contact the [facility] Attending or Consulting provider 7A - 7P or covering provider during after hours 7P -7A, for this patient.   Check the care team in [facility] and look for a) attending/consulting [facility] provider listed and b) the [facility] team listed Log into [email or web address] and use [place]'s universal password to access. If you do not have the password, please contact the hospital operator. Locate the [facility] provider you are looking for under [facility] and page to a number that you can be directly reached. If you still have difficulty reaching the  provider, please page the [facility] (Director on Call) for the Hospitalists listed on amion for assistance. [email or web address] 05/21/2022, 11:58 PM

## 2022-05-21 NOTE — ED Provider Notes (Signed)
[facility] Provider Note    Event Date/Time   First MD Initiated Contact with Patient 05/21/22 2027     (approximate)   History   Fall and Altered Mental Status   HPI  Ronald Matthews is a 88 y.o. male   with past medical history of dementia seizure disorder who presents with altered mental status.  Patient unable to provide history given significant altered mental status.  I spoke with Sue Lush the patient's hospice nurse who notes that patient had an acute change in mental status today.  He lives at [place] home.  Apparently he was out in the sun smoking and was wearing a jacket and pants when he came inside his temperature was 104 and she was acutely altered.  Typically he is talkative and able to make his needs known.  His sister is his POA who says that he had been out smoking for 30 minutes in the heat.  He is technically on hospice but Greig Castilla the hospice nurse did not feel comfortable just giving him comfort care given this acute change and he is apparently still getting medication for chronic medical conditions.  Patient's daughter does want him to get medical treatment for reversible conditions such as dehydration or infection but agrees for no intubation and DNR no invasive procedures such as central line pressors or cath.     Past Medical History:  Diagnosis Date   Coronary artery disease    Dementia (HCC)    Hypertension    Thrombocytopenia (HCC)     Patient Active Problem List   Diagnosis Date Noted   LOC (loss of consciousness) (HCC) 06/22/2020   Seizure (HCC) 06/22/2020   Episode of unresponsiveness    Acute cystitis with hematuria    Syncope 05/07/2020   Dehydration 11/28/2019   Altered mental status 11/27/2019   Jaw pain    CVA (cerebral vascular accident) (HCC) 04/17/2019   Ischemic stroke (HCC) 07/15/2018   Hypertension 07/15/2018   Dementia (HCC) 07/15/2018   Coronary artery disease 07/15/2018     Physical Exam  Triage Vital  Signs: ED Triage Vitals [05/21/22 2040]  Enc Vitals Group     BP (!) 65/40     Pulse Rate 97     Resp 12     Temp 98.1 F (36.7 C)     Temp Source Axillary     SpO2 (!) 10 %     Weight      Height      Head Circumference      Peak Flow      Pain Score      Pain Loc      Pain Edu?      Excl. in GC?     Most recent vital signs: Vitals:   05/21/22 2235 05/21/22 2313  BP: (!) 75/46 103/69  Pulse:    Resp:    Temp:    SpO2:       General: Patient is minimally arousable CV:  Good peripheral perfusion.  No edema Resp:  Agonal respirations Abd:  No distention.  No distention Neuro:             Patient's pupils are pinpoint, his left arm is contracted he does withdrawal to painful stimulus in the right arm, does not follow commands GCS 6 Other:     ED Results / Procedures / Treatments  Labs (all labs ordered are listed, but only abnormal results are displayed) Labs Reviewed  COMPREHENSIVE METABOLIC PANEL -  Abnormal; Notable for the following components:      Result Value   CO2 20 (*)    Glucose, Bld 111 (*)    BUN 54 (*)    Creatinine, Ser 4.28 (*)    Calcium 8.3 (*)    Albumin 3.4 (*)    AST 67 (*)    GFR, Estimated 13 (*)    All other components within normal limits  CBC WITH DIFFERENTIAL/PLATELET - Abnormal; Notable for the following components:   WBC 25.9 (*)    RBC 3.55 (*)    Hemoglobin 9.5 (*)    HCT 31.1 (*)    Neutro Abs 23.6 (*)    Monocytes Absolute 1.1 (*)    Abs Immature Granulocytes 0.22 (*)    All other components within normal limits  LACTIC ACID, PLASMA - Abnormal; Notable for the following components:   Lactic Acid, Venous 4.2 (*)    All other components within normal limits  CBG MONITORING, ED - Abnormal; Notable for the following components:   Glucose-Capillary 113 (*)    All other components within normal limits  TROPONIN I (HIGH SENSITIVITY) - Abnormal; Notable for the following components:   Troponin I (High Sensitivity) 8,576 (*)     All other components within normal limits  LACTIC ACID, PLASMA  URINALYSIS, ROUTINE W REFLEX MICROSCOPIC  BLOOD GAS, VENOUS  TROPONIN I (HIGH SENSITIVITY)     EKG  EKG interpretation performed by myself: NSR, nml axis, prolonged QT interval no acute ischemic changes    RADIOLOGY    PROCEDURES:  Critical Care performed: Yes, see critical care procedure note(s)  .1-3 Lead EKG Interpretation  Performed by: Georga Hacking, MD Authorized by: Georga Hacking, MD     Interpretation: normal     ECG rate assessment: normal     Rhythm: sinus rhythm     Ectopy: none     Conduction: normal     The patient is on the cardiac monitor to evaluate for evidence of arrhythmia and/or significant heart rate changes.   MEDICATIONS ORDERED IN ED: Medications  lactated ringers bolus 1,000 mL (has no administration in time range)  ceFEPIme (MAXIPIME) 2 g in sodium chloride 0.9 % 100 mL IVPB (has no administration in time range)  metroNIDAZOLE (FLAGYL) IVPB 500 mg (has no administration in time range)  vancomycin (VANCOCIN) IVPB 1000 mg/200 mL premix (has no administration in time range)  lactated ringers bolus 1,000 mL (1,000 mLs Intravenous New Bag/Given 05/21/22 2216)  naloxone [facility]) injection 2 mg (2 mg Intravenous Given 05/21/22 2213)     IMPRESSION / MDM / ASSESSMENT AND PLAN / ED COURSE  I reviewed the triage vital signs and the nursing notes.                              Patient's presentation is most consistent with acute presentation with potential threat to life or bodily function.  Differential diagnosis includes, but is not limited to, intracranial hemorrhage, heat related illness, sepsis, cardiogenic shock, hypoglycemia, electrolyte abnormality  The patient is a 88 year old male who is currently on hospice but still is receiving care for chronic medical conditions per Sue Lush the hospice nurse.  He had an acute change in mental status today after being outside  smoking in the heat wearing a jacket and pants.  Apparently his temperature was 104.  On arrival to the ED patient is essentially unconscious but is breathing, also pain in  the right upper extremity.  Pupils are pinpoint.  IV access was obtained via ultrasound and fluids begun as patient is quite hypotensive.  Saturations are 99%.  I did give him 2 mg of IV Narcan given the pinpoint pupils and he had no response to this.  His labs are notable for leukocytosis to 25.  I spoke with patient's sister who is his POA who does want him to be treated for any reversible medical conditions and would be agreeable for admission.  Will cover with broad-spectrum antibiotics.  I am concerned for an acute intracranial event patient going for CT now.  Signed out to oncoming vita pending imaging remainder of labs.  Will require admission.   Patient has a leukocytosis to 26 lactate of 4.2 troponin of 9000 creatinine is 4.2 electrolytes are otherwise within normal limits.  CT head although limited by motion artifact does not have any acute abnormality.  He is receiving IV fluids and broad-spectrum antibiotics.  I did try Narcan but he did not have response to this.  Blood pressure has improved and he is starting to be somewhat more vocal groaning but still no purposeful speech for following commands.  Gust with the hospitalist for admission.   FINAL CLINICAL IMPRESSION(S) / ED DIAGNOSES   Final diagnoses:  Altered mental status, unspecified altered mental status type  Sepsis, due to unspecified organism, unspecified whether acute organ dysfunction present [facility])  NSTEMI (non-ST elevated myocardial infarction) (HCC)     Rx / DC Orders   ED Discharge Orders     None        Note:  This document was prepared using Dragon voice recognition software and may include unintentional dictation errors.   Georga Hacking, MD 05/21/22 [phone]

## 2022-05-22 ENCOUNTER — Other Ambulatory Visit: Payer: Self-pay

## 2022-05-22 ENCOUNTER — Encounter: Payer: Self-pay | Admitting: Internal Medicine

## 2022-05-22 DIAGNOSIS — Z79899 Other long term (current) drug therapy: Secondary | ICD-10-CM | POA: Diagnosis not present

## 2022-05-22 DIAGNOSIS — R4182 Altered mental status, unspecified: Secondary | ICD-10-CM | POA: Diagnosis present

## 2022-05-22 DIAGNOSIS — Z66 Do not resuscitate: Secondary | ICD-10-CM

## 2022-05-22 DIAGNOSIS — R6521 Severe sepsis with septic shock: Secondary | ICD-10-CM

## 2022-05-22 DIAGNOSIS — D638 Anemia in other chronic diseases classified elsewhere: Secondary | ICD-10-CM | POA: Diagnosis present

## 2022-05-22 DIAGNOSIS — J189 Pneumonia, unspecified organism: Secondary | ICD-10-CM | POA: Diagnosis present

## 2022-05-22 DIAGNOSIS — I214 Non-ST elevation (NSTEMI) myocardial infarction: Secondary | ICD-10-CM | POA: Diagnosis present

## 2022-05-22 DIAGNOSIS — E872 Acidosis, unspecified: Secondary | ICD-10-CM

## 2022-05-22 DIAGNOSIS — D649 Anemia, unspecified: Secondary | ICD-10-CM | POA: Diagnosis present

## 2022-05-22 DIAGNOSIS — Z881 Allergy status to other antibiotic agents status: Secondary | ICD-10-CM | POA: Diagnosis not present

## 2022-05-22 DIAGNOSIS — M79601 Pain in right arm: Secondary | ICD-10-CM | POA: Diagnosis present

## 2022-05-22 DIAGNOSIS — G9341 Metabolic encephalopathy: Secondary | ICD-10-CM | POA: Diagnosis present

## 2022-05-22 DIAGNOSIS — W19XXXA Unspecified fall, initial encounter: Secondary | ICD-10-CM | POA: Diagnosis present

## 2022-05-22 DIAGNOSIS — G40909 Epilepsy, unspecified, not intractable, without status epilepticus: Secondary | ICD-10-CM | POA: Diagnosis present

## 2022-05-22 DIAGNOSIS — Y92129 Unspecified place in nursing home as the place of occurrence of the external cause: Secondary | ICD-10-CM | POA: Diagnosis not present

## 2022-05-22 DIAGNOSIS — Z515 Encounter for palliative care: Secondary | ICD-10-CM | POA: Diagnosis not present

## 2022-05-22 DIAGNOSIS — A419 Sepsis, unspecified organism: Secondary | ICD-10-CM | POA: Diagnosis present

## 2022-05-22 DIAGNOSIS — I69354 Hemiplegia and hemiparesis following cerebral infarction affecting left non-dominant side: Secondary | ICD-10-CM | POA: Diagnosis not present

## 2022-05-22 DIAGNOSIS — S0003XA Contusion of scalp, initial encounter: Secondary | ICD-10-CM | POA: Diagnosis present

## 2022-05-22 DIAGNOSIS — N179 Acute kidney failure, unspecified: Secondary | ICD-10-CM | POA: Diagnosis present

## 2022-05-22 DIAGNOSIS — Z7982 Long term (current) use of aspirin: Secondary | ICD-10-CM | POA: Diagnosis not present

## 2022-05-22 DIAGNOSIS — J9601 Acute respiratory failure with hypoxia: Secondary | ICD-10-CM

## 2022-05-22 DIAGNOSIS — E860 Dehydration: Secondary | ICD-10-CM | POA: Diagnosis present

## 2022-05-22 DIAGNOSIS — F0390 Unspecified dementia without behavioral disturbance: Secondary | ICD-10-CM | POA: Diagnosis present

## 2022-05-22 DIAGNOSIS — I10 Essential (primary) hypertension: Secondary | ICD-10-CM | POA: Diagnosis present

## 2022-05-22 DIAGNOSIS — E785 Hyperlipidemia, unspecified: Secondary | ICD-10-CM | POA: Diagnosis present

## 2022-05-22 DIAGNOSIS — I2510 Atherosclerotic heart disease of native coronary artery without angina pectoris: Secondary | ICD-10-CM | POA: Diagnosis present

## 2022-05-22 DIAGNOSIS — Z7189 Other specified counseling: Secondary | ICD-10-CM

## 2022-05-22 LAB — COMPREHENSIVE METABOLIC PANEL
ALT: 19 U/L (ref 0–44)
AST: 64 U/L — ABNORMAL HIGH (ref 15–41)
Albumin: 3.2 g/dL — ABNORMAL LOW (ref 3.5–5.0)
Alkaline Phosphatase: 94 U/L (ref 38–126)
Anion gap: 12 (ref 5–15)
BUN: 54 mg/dL — ABNORMAL HIGH (ref 8–23)
CO2: 18 mmol/L — ABNORMAL LOW (ref 22–32)
Calcium: 8.1 mg/dL — ABNORMAL LOW (ref 8.9–10.3)
Chloride: 107 mmol/L (ref 98–111)
Creatinine, Ser: 3.92 mg/dL — ABNORMAL HIGH (ref 0.61–1.24)
GFR, Estimated: 14 mL/min — ABNORMAL LOW (ref >60)
Glucose, Bld: 100 mg/dL — ABNORMAL HIGH (ref 70–99)
Potassium: 5 mmol/L (ref 3.5–5.1)
Sodium: 137 mmol/L (ref 135–145)
Total Bilirubin: 0.4 mg/dL (ref 0.3–1.2)
Total Protein: 7.2 g/dL (ref 6.5–8.1)

## 2022-05-22 LAB — BLOOD GAS, VENOUS
Acid-base deficit: 8.4 mmol/L — ABNORMAL HIGH (ref 0.0–2.0)
Bicarbonate: 19.6 mmol/L — ABNORMAL LOW (ref 20.0–28.0)
O2 Saturation: 31.1 %
Patient temperature: 37
pCO2, Ven: 49 mmHg (ref 44–60)
pH, Ven: 7.21 — ABNORMAL LOW (ref 7.25–7.43)
pO2, Ven: <31 mmHg — CL (ref 32–45)

## 2022-05-22 LAB — TYPE AND SCREEN
ABO/RH(D): B POS
Antibody Screen: NEGATIVE

## 2022-05-22 LAB — CBC
HCT: 29.1 % — ABNORMAL LOW (ref 39.0–52.0)
Hemoglobin: 9 g/dL — ABNORMAL LOW (ref 13.0–17.0)
MCH: 27.6 pg (ref 26.0–34.0)
MCHC: 30.9 g/dL (ref 30.0–36.0)
MCV: 89.3 fL (ref 80.0–100.0)
Platelets: 240 10*3/uL (ref 150–400)
RBC: 3.26 MIL/uL — ABNORMAL LOW (ref 4.22–5.81)
RDW: 15.8 % — ABNORMAL HIGH (ref 11.5–15.5)
WBC: 34.2 10*3/uL — ABNORMAL HIGH (ref 4.0–10.5)
nRBC: 0 % (ref 0.0–0.2)

## 2022-05-22 LAB — MRSA NEXT GEN BY PCR, NASAL: MRSA by PCR Next Gen: NOT DETECTED

## 2022-05-22 LAB — CREATININE, SERUM
Creatinine, Ser: 4.01 mg/dL — ABNORMAL HIGH (ref 0.61–1.24)
GFR, Estimated: 14 mL/min — ABNORMAL LOW (ref >60)

## 2022-05-22 LAB — LACTIC ACID, PLASMA: Lactic Acid, Venous: 4.7 mmol/L (ref 0.5–1.9)

## 2022-05-22 LAB — CK: Total CK: 369 U/L (ref 49–397)

## 2022-05-22 LAB — TROPONIN I (HIGH SENSITIVITY): Troponin I (High Sensitivity): 9403 ng/L (ref <18)

## 2022-05-22 MED ORDER — BRIMONIDINE TARTRATE 0.2 % OP SOLN
2.0000 [drp] | Freq: Two times a day (BID) | OPHTHALMIC | Status: DC
Start: 2022-05-22 — End: 2022-05-26
  Administered 2022-05-22 – 2022-05-26 (×9): 2 [drp] via OPHTHALMIC
  Filled 2022-05-22: qty 5

## 2022-05-22 MED ORDER — HALOPERIDOL 0.5 MG PO TABS: 0.5000 mg | ORAL_TABLET | ORAL | Status: DC | PRN

## 2022-05-22 MED ORDER — ACETAMINOPHEN 650 MG RE SUPP: 650.0000 mg | Freq: Four times a day (QID) | RECTAL | Status: DC | PRN

## 2022-05-22 MED ORDER — ASPIRIN 81 MG PO CHEW: 81.0000 mg | CHEWABLE_TABLET | Freq: Every day | ORAL | Status: DC

## 2022-05-22 MED ORDER — BIOTENE DRY MOUTH MT LIQD: 15.0000 mL | OROMUCOSAL | Status: DC | PRN

## 2022-05-22 MED ORDER — SODIUM CHLORIDE 0.9 % IV SOLN: INTRAVENOUS | Status: AC

## 2022-05-22 MED ORDER — ONDANSETRON HCL 4 MG/2ML IJ SOLN: 4.0000 mg | Freq: Four times a day (QID) | INTRAMUSCULAR | Status: DC | PRN

## 2022-05-22 MED ORDER — NITROGLYCERIN 0.4 MG SL SUBL
0.4000 mg | SUBLINGUAL_TABLET | SUBLINGUAL | Status: DC | PRN
Start: 2022-05-22 — End: 2022-05-22

## 2022-05-22 MED ORDER — MORPHINE SULFATE (PF) 2 MG/ML IV SOLN
2.0000 mg | INTRAVENOUS | Status: DC | PRN
  Administered 2022-05-23: 2 mg via INTRAVENOUS
  Filled 2022-05-22: qty 1

## 2022-05-22 MED ORDER — SODIUM CHLORIDE 0.9% FLUSH
3.0000 mL | Freq: Two times a day (BID) | INTRAVENOUS | Status: DC
  Administered 2022-05-22 – 2022-05-26 (×7): 3 mL via INTRAVENOUS

## 2022-05-22 MED ORDER — ACETAMINOPHEN 325 MG PO TABS: 650.0000 mg | ORAL_TABLET | Freq: Four times a day (QID) | ORAL | Status: DC | PRN

## 2022-05-22 MED ORDER — LEVETIRACETAM 500 MG PO TABS: 500.0000 mg | ORAL_TABLET | Freq: Two times a day (BID) | ORAL | Status: DC

## 2022-05-22 MED ORDER — COLLOIDAL OATMEAL 1 % EX CREA
1.0000 | TOPICAL_CREAM | CUTANEOUS | Status: DC
Start: 2022-05-22 — End: 2022-05-22

## 2022-05-22 MED ORDER — LATANOPROST 0.005 % OP SOLN
1.0000 [drp] | Freq: Every day | OPHTHALMIC | Status: DC
  Administered 2022-05-23 – 2022-05-25 (×3): 1 [drp] via OPHTHALMIC
  Filled 2022-05-22: qty 2.5

## 2022-05-22 MED ORDER — GLYCOPYRROLATE 0.2 MG/ML IJ SOLN
0.2000 mg | Freq: Four times a day (QID) | INTRAMUSCULAR | Status: DC | PRN
  Administered 2022-05-23: 0.2 mg via INTRAVENOUS
  Filled 2022-05-22: qty 1

## 2022-05-22 MED ORDER — SODIUM CHLORIDE 0.9 % IV SOLN
2.0000 g | INTRAVENOUS | Status: DC
  Filled 2022-05-22: qty 12.5

## 2022-05-22 MED ORDER — MORPHINE SULFATE (PF) 2 MG/ML IV SOLN: 2.0000 mg | INTRAVENOUS | Status: DC | PRN

## 2022-05-22 MED ORDER — ONDANSETRON 4 MG PO TBDP: 4.0000 mg | ORAL_TABLET | Freq: Four times a day (QID) | ORAL | Status: DC | PRN

## 2022-05-22 MED ORDER — GLYCOPYRROLATE 0.2 MG/ML IJ SOLN
0.2000 mg | Freq: Once | INTRAMUSCULAR | Status: AC
  Administered 2022-05-22: 0.2 mg via INTRAVENOUS
  Filled 2022-05-22: qty 1

## 2022-05-22 MED ORDER — HALOPERIDOL LACTATE 5 MG/ML IJ SOLN: 0.5000 mg | INTRAMUSCULAR | Status: DC | PRN

## 2022-05-22 MED ORDER — METOPROLOL SUCCINATE ER 25 MG PO TB24: 12.5000 mg | ORAL_TABLET | Freq: Every day | ORAL | Status: DC

## 2022-05-22 MED ORDER — LEVETIRACETAM IN NACL 500 MG/100ML IV SOLN: 500.0000 mg | Freq: Two times a day (BID) | INTRAVENOUS | Status: DC

## 2022-05-22 MED ORDER — ASPIRIN 81 MG PO CHEW: 81.0000 mg | CHEWABLE_TABLET | Freq: Every morning | ORAL | Status: DC

## 2022-05-22 MED ORDER — METRONIDAZOLE 500 MG/100ML IV SOLN
500.0000 mg | Freq: Two times a day (BID) | INTRAVENOUS | Status: DC
Start: 2022-05-22 — End: 2022-05-22
  Administered 2022-05-22: 500 mg via INTRAVENOUS
  Filled 2022-05-22 (×2): qty 100

## 2022-05-22 MED ORDER — HALOPERIDOL LACTATE 2 MG/ML PO CONC: 0.5000 mg | ORAL | Status: DC | PRN

## 2022-05-22 MED ORDER — ATORVASTATIN CALCIUM 20 MG PO TABS: 40.0000 mg | ORAL_TABLET | Freq: Every evening | ORAL | Status: DC

## 2022-05-22 MED ORDER — VANCOMYCIN VARIABLE DOSE PER UNSTABLE RENAL FUNCTION (PHARMACIST DOSING): Status: DC

## 2022-05-22 MED ORDER — LEVETIRACETAM IN NACL 500 MG/100ML IV SOLN
500.0000 mg | INTRAVENOUS | Status: DC
  Administered 2022-05-22 – 2022-05-26 (×5): 500 mg via INTRAVENOUS
  Filled 2022-05-22 (×5): qty 100

## 2022-05-22 MED ORDER — POLYVINYL ALCOHOL 1.4 % OP SOLN: 1.0000 [drp] | Freq: Four times a day (QID) | OPHTHALMIC | Status: DC | PRN

## 2022-05-22 MED ORDER — MORPHINE SULFATE (PF) 2 MG/ML IV SOLN: 2.0000 mg | Freq: Four times a day (QID) | INTRAVENOUS | Status: DC | PRN

## 2022-05-22 NOTE — Assessment & Plan Note (Signed)
Type and screen. IV ppi. Transfuse as deemed appropriate with volume overload.

## 2022-05-22 NOTE — Consult Note (Signed)
Consultation Note Date: 05/22/2022   Patient Name: Ronald Matthews  DOB: [birth year]  MRN: [id number]  Age / Sex: 88 y.o., male  PCP: Bonnita Nasuti, MD Referring Physician: Antonieta Pert, MD  Reason for Consultation: Establishing goals of care  HPI/Patient Profile: 88 y.o. male  with past medical history of dementia, CAD, HTN, HLD, thrombocytopenia, CVA with residual left-sided weakness, and history of seizure admitted from a facility with hospice care on 05/21/2022 with acute change in mental status and a fever.  Patient was hypotensive and hypoxic on admission with CT of head revealing no acute findings.  Goals of care were discussed with ED provider and patient's [facility] POA/Ronald Matthews.  Family understands patient has a poor prognosis and wanted supportive measures with no aggressive treatments.  Family was in agreement to DNR/DNI.  PMT was consulted to continue goals of care discussion.  Clinical Assessment and Goals of Care: I have reviewed medical records including EPIC notes, labs and imaging, assessed the patient.  During my initial visit, no family is at bedside.  Patient's bladder was distended and bladder scan revealed over 600 cc.  I personally placed a Foley catheter and frank red blood was drained.  Patient appeared to be relieved and moaning and groaning desisted.   I rounded about an hour later and then met with patient, his daughter and, and spoke with his [facility] POA/Ronald Matthews over the phone to discuss diagnosis prognosis, [place], EOL wishes, disposition and options.  I introduced Palliative Medicine as specialized medical care for people living with serious illness. It focuses on providing relief from the symptoms and stress of a serious illness. The goal is to improve quality of life for both the patient and the family.  We discussed a brief life review of the patient.  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  As far as functional and nutritional status PTA patient was up and walking and had a healthy appetite.  We discussed patient's current illness and what it means in the larger context of patient's on-going co-morbidities.  Natural disease trajectory and expectations at EOL were discussed. I attempted to elicit values and goals of care important to the patient.  Family was in agreement to focus solely on comfort.  Ronald Matthews expressed she wants to make sure the patient does not suffer and is free from pain.  The difference between aggressive medical intervention and comfort care was considered in light of the patient's goals of care.   Discussed with patient/family the importance of continued conversation with family and the medical providers regarding overall plan of care and treatment options, ensuring decisions are within the context of the patient's values and GOCs.    Reviewed that full comfort measures include as needed medications for increased work of breathing, air hunger, anxiety, restlessness, agitation, and nausea/vomiting.  Reviewed that medications and procedures not focused solely on comfort will be discontinued, including antibiotics.  Family was in agreement with full comfort measures.  DNR/DNI remains.  Nursing and attending notified  of full comfort measures.  Questions and concerns were addressed. The family was encouraged to call with questions or concerns.   There is no PMT and person coverage over the weekend at [facility].  Please utilize [facility] on for on-call providers.  Palliative medicine team will continue to follow the patient about his hospitalization.  Provider will be in person and able to check on the patient on Monday.  Primary Decision Maker HCPOA  Code Status/Advance Care Planning: DNR  Prognosis:   Hours - Days  Discharge Planning: Anticipated Hospital Death  Primary Diagnoses: Present on Admission:  (Resolved) Coronary artery  disease  Acute metabolic encephalopathy  Severe sepsis with septic shock ([place])  Community acquired pneumonia  NSTEMI (non-ST elevated myocardial infarction) ([place])  Acute respiratory failure with hypoxia ([facility])  Anemia  AMS (altered mental status)   Physical Exam Vitals reviewed.  Constitutional:      Appearance: He is ill-appearing.  HENT:     Head: Normocephalic.     Mouth/Throat:     Mouth: Mucous membranes are moist.     Comments: Drooping of nasolabial folds Cardiovascular:     Pulses: Normal pulses.  Pulmonary:     Breath sounds: Rhonchi present.  Abdominal:     Comments: Bladder palpable/distended prior to foley placement  Musculoskeletal:     Comments: Generalized weakness  Skin:    General: Skin is warm.  Neurological:     Comments: Nonverbal, moans/groans in response to movement     Palliative Assessment/Data: 10%     I discussed this patient's plan of care with patient, patient's daughter, patient's Barth Kirks, patient's nephew, RN Brandi, Dr. Maren Beach.  Thank you for this consult. Palliative medicine will continue to follow and assist holistically.   Time Total: 130 minutes Greater than 50%  of this time was spent counseling and coordinating care related to the above assessment and plan.  Signed by: Ronald Hawks, DNP, FNP-BC Palliative Medicine    Please contact Palliative Medicine Team phone at [phone] for questions and concerns.  For individual provider: See Shea Evans

## 2022-05-22 NOTE — Assessment & Plan Note (Signed)
Continue patient on vancomycin cefepime and Flagyl for community-acquired pneumonia.

## 2022-05-22 NOTE — Progress Notes (Signed)
Nutrition Brief Note  Chart reviewed. Pt now transitioning to comfort care.  No further nutrition interventions planned at this time.  Please re-consult as needed.   Jaksen Fiorella W, RD, LDN, CDCES Registered Dietitian II Certified Diabetes Care and Education Specialist Please refer to [facility] for RD and/or RD on-call/weekend/after hours pager   

## 2022-05-22 NOTE — Assessment & Plan Note (Addendum)
NSTEMI with a rising troponin of  Latest Reference Range & Units 05/21/22 22:20 05/21/22 22:58  Troponin I (High Sensitivity) <18 ng/L 8,576 (HH) 9,403 (HH)  Antiplatelet therapy currently held due to patient's anemia. Beta-blocker held due to hypotension. Aspirin held due to anemia. Statin held as patient is currently altered and NPO. Suspect combination of NSTEMI as patient has a history of CAD, and sepsis related troponin elevation. Conservative measures after discussing with sister cardiology consult if desired by family member. >>Patient's last echocardiogram shows: 1. Severe dyskinesis of the left ventricular, basal inferoseptal wall.  2. The left ventricle has normal systolic function, with an ejection  fraction of 55-60%. The cavity size was normal. There is moderate to  severe focal basal hypertrophy of the septum with otherwise mild left  ventricular wall thickening. Left ventricular  diastolic Doppler parameters are consistent with impaired relaxation.  3. The right ventricle has normal systolic function. The cavity was  normal. There is mildly increased right ventricular wall thickness. Right  ventricular systolic pressure could not be assessed.  4. The mitral valve is degenerative. Mild thickening of the mitral valve  leaflet.  5. The aortic valve is tricuspid. Mild thickening of the aortic valve.  Mild calcification of the aortic valve. Aortic valve regurgitation is  trivial by color flow Doppler.  6. The aortic root is normal in size and structure.  7. The interatrial septum was not well visualized.   Family decided to proceed with comfort care only.  Not a candidate for aggressive treatment

## 2022-05-22 NOTE — Assessment & Plan Note (Addendum)
Keppra changed to IV. Seizure precautions.

## 2022-05-22 NOTE — Hospital Course (Addendum)
88 year old male with history of dementia CAD hypertension, HLD thrombocytopenia, previous CVA with left-sided weakness, history of seizure who is from the facility with hospice care sent to the ED due to acute change in mental status, fall after he was out in the sun his smoking with waiting jacket and pants, on return to inside temperature 104 and was acutely confused.  At baseline he is able to talk able to make his needs known, sister is the POA.  He was sent to the hospital for admission In the ED, hypotensive  65/40, hypoxic needing oxygen, labs showed significant acute renal failure creatinine 4.28, bicarb 20, elevated troponin 8576> 07/06/2002, leukocytosis 25.9 K, anemia 9.5 g, lactic acidosis 4.2.  Chest x-ray showed patchy multifocal bilateral airspace disease favoring pneumonia over edema,, CT head no acute finding, right frontal scalp hematoma, unchanged mental dilatation, moderate advanced chronic small vessel ischemic changes. Goals of care were discussed with patient's sister POA during admission-was explained poor prognosis-family wanted supportive measures with IV fluids and IV antibiotics and no further escalation of care or aggressive measure no CPR central line cardiac cath or pressure.  Patient received 2 L bolus normal saline, Narcan, vancomycin and cefepime.  He was admitted.

## 2022-05-22 NOTE — Progress Notes (Signed)
Pharmacy Antibiotic Note  Ronald Matthews is a 88 y.o. male admitted on 05/21/2022 with sepsis/PNA.  Pharmacy has been consulted for cefepime and vancomycin dosing.  -also on metronidazole  Plan: Cefepime 2 gm IV q24h  for Crcl 14 ml/min  Pt received Vancomycin 1 gram IV x 1 in ED 7/21 @0122  -Will continue with Variable Vancomycin dosing per renal fxn. Will check Vancomycin levels to determine dosing.  F/u renal fxn and cultures   Height: 5\' 10"  (177.8 cm) Weight: 76.9 kg (169 lb 8.5 oz) IBW/kg (Calculated) : 73  Temp (24hrs), Avg:98 F (36.7 C), Min:97.9 F (36.6 C), Max:98.1 F (36.7 C)  Recent Labs  Lab 05/21/22 2220 05/22/22 0005 05/22/22 0255  WBC 25.9*  --  34.2*  CREATININE 4.28*  --   --   LATICACIDVEN 4.2* 4.7*  --     Estimated Creatinine Clearance: 12.8 mL/min (A) (by C-G formula based on SCr of 4.28 mg/dL (H)).    Allergies  Allergen Reactions   Cephalexin Other (See Comments)   Ppd [Tuberculin Purified Protein Derivative] Other (See Comments)    "Allergic," per MAR   Tuberculin Tests Other (See Comments)    "Allergic," per MAR    Antimicrobials this admission: Metronidazole 7/20 >> Vancomycin 7/21 >>   Cefepime 7/20 (evening)  >>    Dose adjustments this admission:    Microbiology results:   BCx:     UCx:      Sputum:    7/21 MRSA PCR: pending 7/20 CXr; Patchy multifocal bilateral airspace disease as above, favor pneumonia over edema  Thank you for allowing pharmacy to be a part of this patient's care.  Hatsue Sime A 05/22/2022 3:51 AM

## 2022-05-22 NOTE — Assessment & Plan Note (Addendum)
Patient is encephalopathic secondary to severe sepsis, pneumonia, NSTEMI. Discussed with sister Ms. [name] on the phone about guarded prognosis and uncertainty about patient making through tonight.  Ms. Seward Grater said that she will come in the morning.  She has nobody to bring her in the night.  She saw her brother about 2 weeks ago.

## 2022-05-22 NOTE — Assessment & Plan Note (Addendum)
Initial concern of sepsis most likely secondary to pneumonia versus UTI.  Also had AKI and lactic acidosis.  Received broad-spectrum antibiotics and IV fluid with improvement in blood pressure. Family later decided to proceed with comfort care only and antibiotics were discontinued.

## 2022-05-22 NOTE — Progress Notes (Signed)
   05/22/22 0252  Assess: MEWS Score  Temp 97.9 F (36.6 C)  BP (!) 87/56  MAP (mmHg) 67  Pulse Rate (!) 51  Resp 12  SpO2 (!) 76 %  O2 Device Nasal Cannula  O2 Flow Rate (L/min) 3 L/min  Assess: MEWS Score  MEWS Temp 0  MEWS Systolic 1  MEWS Pulse 0  MEWS RR 1  MEWS LOC 2  MEWS Score 4  MEWS Score Color Red  Assess: if the MEWS score is Yellow or Red  Were vital signs taken at a resting state? Yes  Focused Assessment No change from prior assessment  Does the patient meet 2 or more of the SIRS criteria? No  MEWS guidelines implemented *See Row Information* Yes  Treat  MEWS Interventions Escalated (See documentation below);Consulted Respiratory Therapy  Pain Scale Faces  Faces Pain Scale 0  Take Vital Signs  Increase Vital Sign Frequency  Red: Q 1hr X 4 then Q 4hr X 4, if remains red, continue Q 4hrs  Escalate  MEWS: Escalate Red: discuss with charge nurse/RN and provider, consider discussing with RRT  Notify: Charge Nurse/RN  Name of Charge Nurse/RN Notified Jackie, RN  Date Charge Nurse/RN Notified 05/22/22  Time Charge Nurse/RN Notified 0329  Notify: Provider  Provider Name/Title Manuela Schwartz, NP  Date Provider Notified 05/22/22  Time Provider Notified [phone]  Method of Notification Page  Notification Reason Other (Comment) (Red MEWS)  Document  Patient Outcome Not stable and remains on department  Progress note created (see row info) Yes  Assess: SIRS CRITERIA  SIRS Temperature  0  SIRS Pulse 0  SIRS Respirations  0  SIRS WBC 1  SIRS Score Sum  1

## 2022-05-22 NOTE — Assessment & Plan Note (Addendum)
Secondary to pneumonia. Supportive care with supplemental oxygen. Respiratory therapy consult to assess every 4 hours and DuoNeb therapy as deemed appropriate.

## 2022-05-22 NOTE — Progress Notes (Signed)
PROGRESS NOTE Ronald Matthews  EQA:[id number] DOB: 1933-06-12 DOA: 05/21/2022 PCP: Galvin Proffer, MD   Brief Narrative/Hospital Course: 88 year old male with history of dementia CAD hypertension, HLD thrombocytopenia, previous CVA with left-sided weakness, history of seizure who is from the facility with hospice care sent to the ED due to acute change in mental status, fall after he was out in the sun his smoking with waiting jacket and pants, on return to inside temperature 104 and was acutely confused.  At baseline he is able to talk able to make his needs known, sister is the POA.  He was sent to the hospital for admission In the ED, hypotensive  65/40, hypoxic needing oxygen, labs showed significant acute renal failure creatinine 4.28, bicarb 20, elevated troponin 8576> 07/06/2002, leukocytosis 25.9 K, anemia 9.5 g, lactic acidosis 4.2.  Chest x-ray showed patchy multifocal bilateral airspace disease favoring pneumonia over edema,, CT head no acute finding, right frontal scalp hematoma, unchanged mental dilatation, moderate advanced chronic small vessel ischemic changes. Goals of care were discussed with patient's sister POA during admission-was explained poor prognosis-family wanted supportive measures with IV fluids and IV antibiotics and no further escalation of care or aggressive measure no CPR central line cardiac cath or pressure.  Patient received 2 L bolus normal saline, Narcan, vancomycin and cefepime.  He was admitted.  Subjective: Seen and examined this morning urgently after informing by the nursing staff. Patient encephalopathic nonverbal unable to follow any commands, eyes are open.  BP has been low in 60s to 90s this morning, on oxygen, Last night he was in agonal breathing family arrived to the bedside after they were informed of possible imminent death and they left this morning at 730 No family at bedside on my arrival  Assessment and Plan: Principal Problem:   Altered mental  status Active Problems:   Severe sepsis with septic shock (HCC)   Community acquired pneumonia   NSTEMI (non-ST elevated myocardial infarction) (HCC)   Seizure (HCC)   AMS (altered mental status)   Acute respiratory failure with hypoxia (HCC)   Anemia   Lactic acidosis   AKI (acute kidney injury) (HCC)   Goals of care/End of life care: Patient was under hospice care admitted for IV fluids and IV antibiotics supportive care.  Again discussed in detail with POA this morning she understands critical condition, agrees for no escalation of care, palliative care consulted for on IV fluids/IV antibiotics and hopefully we can discontinue after further discussion by palliative care. MEWS deactivated for comfort. Addendum: informed by Samara Deist w/ PMT who has discussed with daughter and sister and at this time family decided to initiate full comfort measures with IVF only.  Acute metabolic encephalopathy in the setting of dementia: Mental status worsened from his baseline.  CT head no acute finding suspect multifactorial with sepsis pneumonia MI hypoxia.  Severe sepsis Multifocal pneumonia Lactic acidosis: Received 2 L bolus fluid, NS running at 75 cc, continue gentle fluids only since plan is for comfort care only.   NSTEMI: Not a candidate for further aggressive treatment.  Not started on heparin drip, given anemia.  Unable to take p.o.-so will stop aspirin and statin.   Acute hypoxic respiratory failure continue supplemental oxygen for now. Seizure disorder continue IV Keppra Acute kidney failure with metabolic acidosis :creatinine in the 4 range this morning continue gentle fluid hydration poor prognosis we will insert Foley catheter as he is retaining urine Anemia-hb in 9 gm.  History of CVA History of CAD HTN  HLD  DVT prophylaxis: SCDs Start: 05/22/22 0111 Code Status:   Code Status: DNR Family Communication: plan of care discussed with sister on phone  Patient status is: Admitted as  observation but patient remains inpatient stay due to multiple system failure- EOL, AKI CAD MI, pneumonia Level of care: Telemetry Medical   Dispo: The patient is from: Facility under hospice            Anticipated disposition: Likely inpatient hospice if survives  Mobility Assessment (last 72 hours)     Mobility Assessment     Row Name 05/22/22 0252           Does patient have an order for bedrest or is patient medically unstable No - Continue assessment       What is the highest level of mobility based on the progressive mobility assessment? Level 1 (Bedfast) - Unable to balance while sitting on edge of bed                 Objective: Vitals last 24 hrs: Vitals:   05/22/22 0338 05/22/22 0633 05/22/22 0637 05/22/22 0936  BP: (!) 123/51 (!) 92/49  (!) 99/57  Pulse: 89 (!) 120 64 (!) 118  Resp: 15 10  10   Temp: 97.9 F (36.6 C) (!) 97.5 F (36.4 C)  98.4 F (36.9 C)  TempSrc: Oral Oral  Oral  SpO2: 92% (!) 33% 100% 97%  Weight:      Height:       Weight change:   Physical Examination: General exam: Eyes open nonverbal no other response  HEENT:Oral mucosa moist, Ear/Nose WNL grossly, dentition normal. Respiratory system: bilaterally diminished BS, no use of accessory muscle Cardiovascular system: S1 & S2 +, No JVD. Gastrointestinal system: Abdomen soft,NT,ND, BS+ Nervous System: Unable to move extremities to voice no response but eyes open  Extremities: LE edema neg,distal peripheral pulses palpable.  Skin: No rashes,no icterus. MSK: thin muscle bulk,tone, power  Medications reviewed:  Scheduled Meds:  brimonidine  2 drop Both Eyes BID   [START ON 05/23/2022] latanoprost  1 drop Both Eyes QHS   sodium chloride flush  3 mL Intravenous Q12H   Continuous Infusions:  sodium chloride 75 mL/hr at 05/22/22 0304   ceFEPime (MAXIPIME) IV     levETIRAcetam 500 mg (05/22/22 0437)      Diet Order             Diet NPO time specified  Diet effective now                             Intake/Output Summary (Last 24 hours) at 05/22/2022 1024 Last data filed at 05/22/2022 0407 Gross per 24 hour  Intake 78.56 ml  Output --  Net 78.56 ml   Net IO Since Admission: 78.56 mL [05/22/22 1024]  Wt Readings from Last 3 Encounters:  05/22/22 76.9 kg  06/24/20 64.6 kg  11/27/19 72.6 kg     Unresulted Labs (From admission, onward)    None     Data Reviewed: I have personally reviewed following labs and imaging studies CBC: Recent Labs  Lab 05/21/22 2220 05/22/22 0255  WBC 25.9* 34.2*  NEUTROABS 23.6*  --   HGB 9.5* 9.0*  HCT 31.1* 29.1*  MCV 87.6 89.3  PLT 272 240   Basic Metabolic Panel: Recent Labs  Lab 05/21/22 2220 05/22/22 0255 05/22/22 0436  NA 137 137  --   K 4.8 5.0  --  CL 105 107  --   CO2 20* 18*  --   GLUCOSE 111* 100*  --   BUN 54* 54*  --   CREATININE 4.28* 3.92* 4.01*  CALCIUM 8.3* 8.1*  --    GFR: Estimated Creatinine Clearance: 13.7 mL/min (A) (by C-G formula based on SCr of 4.01 mg/dL (H)). Liver Function Tests: Recent Labs  Lab 05/21/22 2220 05/22/22 0255  AST 67* 64*  ALT 19 19  ALKPHOS 84 94  BILITOT 0.7 0.4  PROT 7.7 7.2  ALBUMIN 3.4* 3.2*   No results for input(s): "LIPASE", "AMYLASE" in the last 168 hours. No results for input(s): "AMMONIA" in the last 168 hours. Coagulation Profile: No results for input(s): "INR", "PROTIME" in the last 168 hours. BNP (last 3 results) No results for input(s): "PROBNP" in the last 8760 hours. HbA1C: No results for input(s): "HGBA1C" in the last 72 hours. CBG: Recent Labs  Lab 05/21/22 2044  GLUCAP 113*   Lipid Profile: No results for input(s): "CHOL", "HDL", "LDLCALC", "TRIG", "CHOLHDL", "LDLDIRECT" in the last 72 hours. Thyroid Function Tests: No results for input(s): "TSH", "T4TOTAL", "FREET4", "T3FREE", "THYROIDAB" in the last 72 hours. Sepsis Labs: Recent Labs  Lab 05/21/22 2220 05/22/22 0005  LATICACIDVEN 4.2* 4.7*    Recent Results  (from the past 240 hour(s))  MRSA Next Gen by PCR, Nasal     Status: None   Collection Time: 05/22/22  4:31 AM   Specimen: Nasal Mucosa; Nasal Swab  Result Value Ref Range Status   MRSA by PCR Next Gen NOT DETECTED NOT DETECTED Final    Comment: (NOTE) The GeneXpert MRSA Assay (FDA approved for NASAL specimens only), is one component of a comprehensive MRSA colonization surveillance program. It is not intended to diagnose MRSA infection nor to guide or monitor treatment for MRSA infections. Test performance is not FDA approved in patients less than 66 years old. Performed at [facility], [address] [place], [place] [address]     Antimicrobials: Anti-infectives (From admission, onward)    Start     Dose/Rate Route Frequency Ordered Stop   05/22/22 2200  ceFEPIme (MAXIPIME) 2 g in sodium chloride 0.9 % 100 mL IVPB        2 g 200 mL/hr over 30 Minutes Intravenous Every 24 hours 05/22/22 0405     05/22/22 0401  vancomycin variable dose per unstable renal function (pharmacist dosing)  Status:  Discontinued         Does not apply See admin instructions 05/22/22 0401 05/22/22 1019   05/22/22 0200  metroNIDAZOLE (FLAGYL) IVPB 500 mg  Status:  Discontinued        500 mg 100 mL/hr over 60 Minutes Intravenous Every 12 hours 05/22/22 0110 05/22/22 1019   05/21/22 2315  vancomycin (VANCOCIN) IVPB 1000 mg/200 mL premix        1,000 mg 200 mL/hr over 60 Minutes Intravenous  Once 05/21/22 2303 05/22/22 0222   05/21/22 2300  vancomycin (VANCOREADY) IVPB 1250 mg/250 mL  Status:  Discontinued        1,250 mg 166.7 mL/hr over 90 Minutes Intravenous  Once 05/21/22 2248 05/21/22 2303   05/21/22 2300  ceFEPIme (MAXIPIME) 2 g in sodium chloride 0.9 % 100 mL IVPB        2 g 200 mL/hr over 30 Minutes Intravenous  Once 05/21/22 2248 05/22/22 0019   05/21/22 2300  metroNIDAZOLE (FLAGYL) IVPB 500 mg        500 mg 100 mL/hr over  60 Minutes Intravenous  Once 05/21/22 2248 05/22/22 0059       Radiology Studies: CT HEAD WO CONTRAST ( )  Result Date: 05/21/2022 CLINICAL DATA:  Mental status change, unknown cause EXAM: CT HEAD WITHOUT CONTRAST TECHNIQUE: Contiguous axial images were obtained from the base of the skull through the vertex without intravenous contrast. RADIATION DOSE REDUCTION: This exam was performed according to the departmental dose-optimization program which includes automated exposure control, adjustment of the mA and/or kV according to patient size and/or use of iterative reconstruction technique. COMPARISON:  Head CT 06/22/2020 FINDINGS: Brain: Despite repeat acquisition there is moderate motion artifact. Allowing for motion, no evidence of acute intracranial hemorrhage. There is ventricular dilatation out of proportion to sulcal prominence, however this is stable from prior exam. Moderate to advanced periventricular chronic small vessel ischemic change. Prominent cerebellar atrophy. 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 There is no subdural or extra-axial collection. Vascular: Atherosclerosis of skullbase vasculature without hyperdense vessel or abnormal calcification. Skull: No fracture or focal lesion. Sinuses/Orbits: Paranasal sinuses and mastoid air cells are clear. The visualized orbits are unremarkable. Other: Right frontal scalp hematoma. IMPRESSION: 1. Allowing for motion artifact, no acute intracranial abnormality. 2. Right frontal scalp hematoma. 3. Unchanged ventricular dilatation out of proportion to sulcal prominence, may be due to central atrophy or normal pressure hydrocephalus. 4. Moderate to advanced chronic small vessel ischemic change. 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 Electronically Signed   By: Narda Rutherford M.D.   On: 05/21/2022 23:10   DG Chest Portable 1 View  Result Date: 05/21/2022 CLINICAL DATA:  Short of breath EXAM: PORTABLE CHEST 1 VIEW COMPARISON:  11/27/2019  FINDINGS: Single frontal view of the chest demonstrates a stable cardiac silhouette. Continued ectasia of the thoracic aorta. Stable calcified right basilar nodule. There is patchy bilateral perihilar airspace disease greatest in the left upper and right mid lung zones. No effusion or pneumothorax. No acute bony abnormality. IMPRESSION: 1. Patchy multifocal bilateral airspace disease as above, favor pneumonia over edema. Electronically Signed   By: Sharlet Salina M.D.   On: 05/21/2022 21:25     LOS: 0 days   Lanae Boast, MD [facility]  05/22/2022, 10:24 AM

## 2022-05-22 NOTE — Progress Notes (Signed)
   05/22/22 1015  Clinical Encounter Type  Visited With Patient not available  Visit Type Initial   Chaplain Sanam Marmo attended to pt per spiritual consult request. No family present at this time and pt not awake or and did not respond when spoken to.  Chaplain B simply offered silent prayer and presence; please let chaplain on-call know if pt, friends or family may need support later on.

## 2022-05-23 DIAGNOSIS — G9341 Metabolic encephalopathy: Secondary | ICD-10-CM | POA: Diagnosis not present

## 2022-05-23 MED ORDER — ORAL CARE MOUTH RINSE: 15.0000 mL | OROMUCOSAL | Status: DC | PRN

## 2022-05-23 NOTE — Progress Notes (Signed)
PROGRESS NOTE [facility]  NLG:[id number] DOB: 05-27-1933 DOA: 05/21/2022 PCP: Galvin Proffer, MD   Brief Narrative/Hospital Course: 88 year old male with history of dementia CAD hypertension, HLD thrombocytopenia, previous CVA with left-sided weakness, history of seizure who is from the facility with hospice care sent to the ED due to acute change in mental status, fall after he was out in the sun his smoking with waiting jacket and pants, on return to inside temperature 104 and was acutely confused.  At baseline he is able to talk able to make his needs known, sister is the POA.  He was sent to the hospital for admission In the ED, hypotensive  65/40, hypoxic needing oxygen, labs showed significant acute renal failure creatinine 4.28, bicarb 20, elevated troponin 8576> 07/06/2002, leukocytosis 25.9 K, anemia 9.5 g, lactic acidosis 4.2.  Chest x-ray showed patchy multifocal bilateral airspace disease favoring pneumonia over edema,, CT head no acute finding, right frontal scalp hematoma, unchanged mental dilatation, moderate advanced chronic small vessel ischemic changes. Goals of care were discussed with patient's sister POA during admission-was explained poor prognosis-family wanted supportive measures with IV fluids and IV antibiotics and no further escalation of care or aggressive measure no CPR central line cardiac cath or pressure.  Patient received 2 L bolus normal saline, Narcan, vancomycin and cefepime.  He was admitted.  Patient remained critically ill-family came to visit during the night, ongoing discussion of goals of care-and family opted to to continue with comfort measures along with IV fluid hydration.  Subjective: Seen this am He is more awake this but no communicative, movind his hand Foley w/ dark urine output  Assessment and Plan: Principal Problem:   Acute metabolic encephalopathy Active Problems:   Severe sepsis with septic shock (HCC)   Community acquired pneumonia   NSTEMI  (non-ST elevated myocardial infarction) (HCC)   Seizure (HCC)   AMS (altered mental status)   Acute respiratory failure with hypoxia (HCC)   Anemia   Lactic acidosis   AKI (acute kidney injury) (HCC)   Goals of care/End of life care: Patient was under hospice care admitted with altered mental status.  Palliative care following closely he has multiple system involvement including sepsis pneumonia MRI hypoxia encephalopathy-continue on gentle IV hydration, continue on comfort measures supportive care as per discussion with family.  Sister is POA, palliative care discussed with her yesterday and also with daughter.  Acute metabolic encephalopathy in the setting of dementia: Remains encephalopathic, with baseline dementia however was able to talk and make people aware of his need at baseline   Severe sepsis Multifocal pneumonia Lactic acidosis: Continue with end-of-life care, on IV fluids.  Antibiotic discontinued 7/21  NSTEMI:Not a candidate for further aggressive treatment. On EOL care  Acute hypoxic respiratory failure - on [place] Seizure disorder continue IV Keppra for now AKI with metabolic acidosis :creatinine in the 4 range, significantly elevated Possible heat exhaustion Anemia of chronic disease hb in 9 gm. History of CVA History of CAD HTN HLD  DVT prophylaxis: none for comfort Code Status:   Code Status: DNR Family Communication: plan of care discussed with sister on phone with nursing staff Palliative care yesterday Patient status is: Remains inpatient for comfort measures end-of-life care  Level of care: Telemetry Medical  Dispo: The patient is from: Facility under hospice            Anticipated disposition:TBD Mobility Assessment (last 72 hours)     Mobility Assessment     Row Name 05/22/22 1930 05/22/22  0252         Does patient have an order for bedrest or is patient medically unstable No - Continue assessment No - Continue assessment      What is the highest  level of mobility based on the progressive mobility assessment? Level 1 (Bedfast) - Unable to balance while sitting on edge of bed Level 1 (Bedfast) - Unable to balance while sitting on edge of bed             Objective: Vitals last 24 hrs: Vitals:   05/22/22 0633 05/22/22 0637 05/22/22 0936 05/23/22 0915  BP: (!) 92/49  (!) 99/57 130/68  Pulse: (!) 120 64 (!) 118 91  Resp: 10  10 10   Temp: (!) 97.5 F (36.4 C)  98.4 F (36.9 C) 98.6 F (37 C)  TempSrc: Oral  Oral   SpO2: (!) 33% 100% 97% (!) 55%  Weight:      Height:      Weight change:   Physical Examination: General exam: Nonverbal, moving arms,alert, awake noncommunicative.   HEENT:Oral mucosa moist, Ear/Nose WNL grossly, dentition normal. Respiratory system: bilaterally diminished, no use of accessory muscle Cardiovascular system: S1 & S2 +, No JVD,. Gastrointestinal system: Abdomen soft,NT,ND,BS+ Nervous System: non communicative Extremities: LE ankle edema neg, distal peripheral pulses palpable.  Skin: No rashes,no icterus. MSK: Normal muscle bulk,tone, power  Foley+ Left Medications reviewed:  Scheduled Meds:  brimonidine  2 drop Both Eyes BID   latanoprost  1 drop Both Eyes QHS   sodium chloride flush  3 mL Intravenous Q12H   Continuous Infusions:  sodium chloride 75 mL/hr at 05/23/22 0614   levETIRAcetam 500 mg (05/23/22 0449)      Diet Order             Diet NPO time specified  Diet effective now                            Intake/Output Summary (Last 24 hours) at 05/23/2022 1111 Last data filed at 05/23/2022 0616 Gross per 24 hour  Intake 243.19 ml  Output 2250 ml  Net -2006.81 ml   Net IO Since Admission: -1,928.25 mL [05/23/22 1111]  Wt Readings from Last 3 Encounters:  05/22/22 76.9 kg  06/24/20 64.6 kg  11/27/19 72.6 kg     Unresulted Labs (From admission, onward)    None     Data Reviewed: I have personally reviewed following labs and imaging studies CBC: Recent Labs   Lab 05/21/22 2220 05/22/22 0255  WBC 25.9* 34.2*  NEUTROABS 23.6*  --   HGB 9.5* 9.0*  HCT 31.1* 29.1*  MCV 87.6 89.3  PLT 272 [id number]   Basic Metabolic Panel: Recent Labs  Lab 05/21/22 2220 05/22/22 0255 05/22/22 0436  NA 137 137  --   K 4.8 5.0  --   CL 105 107  --   CO2 20* 18*  --   GLUCOSE 111* 100*  --   BUN 54* 54*  --   CREATININE 4.28* 3.92* 4.01*  CALCIUM 8.3* 8.1*  --    GFR: Estimated Creatinine Clearance: 13.7 mL/min (A) (by C-G formula based on SCr of 4.01 mg/dL (H)). Liver Function Tests: Recent Labs  Lab 05/21/22 2220 05/22/22 0255  AST 67* 64*  ALT 19 19  ALKPHOS 84 94  BILITOT 0.7 0.4  PROT 7.7 7.2  ALBUMIN 3.4* 3.2*   No results for input(s): "LIPASE", "AMYLASE" in the last 168  hours. No results for input(s): "AMMONIA" in the last 168 hours. Coagulation Profile: No results for input(s): "INR", "PROTIME" in the last 168 hours. BNP (last 3 results) No results for input(s): "PROBNP" in the last 8760 hours. HbA1C: No results for input(s): "HGBA1C" in the last 72 hours. CBG: Recent Labs  Lab 05/21/22 2044  GLUCAP 113*   Lipid Profile: No results for input(s): "CHOL", "HDL", "LDLCALC", "TRIG", "CHOLHDL", "LDLDIRECT" in the last 72 hours. Thyroid Function Tests: No results for input(s): "TSH", "T4TOTAL", "FREET4", "T3FREE", "THYROIDAB" in the last 72 hours. Sepsis Labs: Recent Labs  Lab 05/21/22 2220 05/22/22 0005  LATICACIDVEN 4.2* 4.7*    Recent Results (from the past 240 hour(s))  MRSA Next Gen by PCR, Nasal     Status: None   Collection Time: 05/22/22  4:31 AM   Specimen: Nasal Mucosa; Nasal Swab  Result Value Ref Range Status   MRSA by PCR Next Gen NOT DETECTED NOT DETECTED Final    Comment: (NOTE) The GeneXpert MRSA Assay (FDA approved for NASAL specimens only), is one component of a comprehensive MRSA colonization surveillance program. It is not intended to diagnose MRSA infection nor to guide or monitor treatment for MRSA  infections. Test performance is not FDA approved in patients less than 17 years old. Performed at [facility], [address] [place], [place] [address]     Antimicrobials: Anti-infectives (From admission, onward)    Start     Dose/Rate Route Frequency Ordered Stop   05/22/22 2200  ceFEPIme (MAXIPIME) 2 g in sodium chloride 0.9 % 100 mL IVPB  Status:  Discontinued        2 g 200 mL/hr over 30 Minutes Intravenous Every 24 hours 05/22/22 0405 05/22/22 1229   05/22/22 0401  vancomycin variable dose per unstable renal function (pharmacist dosing)  Status:  Discontinued         Does not apply See admin instructions 05/22/22 0401 05/22/22 1019   05/22/22 0200  metroNIDAZOLE (FLAGYL) IVPB 500 mg  Status:  Discontinued        500 mg 100 mL/hr over 60 Minutes Intravenous Every 12 hours 05/22/22 0110 05/22/22 1019   05/21/22 2315  vancomycin (VANCOCIN) IVPB 1000 mg/200 mL premix        1,000 mg 200 mL/hr over 60 Minutes Intravenous  Once 05/21/22 2303 05/22/22 0222   05/21/22 2300  vancomycin (VANCOREADY) IVPB 1250 mg/250 mL  Status:  Discontinued        1,250 mg 166.7 mL/hr over 90 Minutes Intravenous  Once 05/21/22 2248 05/21/22 2303   05/21/22 2300  ceFEPIme (MAXIPIME) 2 g in sodium chloride 0.9 % 100 mL IVPB        2 g 200 mL/hr over 30 Minutes Intravenous  Once 05/21/22 2248 05/22/22 0019   05/21/22 2300  metroNIDAZOLE (FLAGYL) IVPB 500 mg        500 mg 100 mL/hr over 60 Minutes Intravenous  Once 05/21/22 2248 05/22/22 0059      Radiology Studies: CT HEAD WO CONTRAST (5MM)  Result Date: 05/21/2022 CLINICAL DATA:  Mental status change, unknown cause EXAM: CT HEAD WITHOUT CONTRAST TECHNIQUE: Contiguous axial images were obtained from the base of the skull through the vertex without intravenous contrast. RADIATION DOSE REDUCTION: This exam was performed according to the departmental dose-optimization program which includes automated exposure control, adjustment of the mA  and/or kV according to patient size and/or use of iterative reconstruction technique. COMPARISON:  Head CT 06/22/2020 FINDINGS: Brain: Despite repeat acquisition  there is moderate motion artifact. Allowing for motion, no evidence of acute intracranial hemorrhage. There is ventricular dilatation out of proportion to sulcal prominence, however this is stable from prior exam. Moderate to advanced periventricular chronic small vessel ischemic change. Prominent cerebellar atrophy. 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 There is no subdural or extra-axial collection. Vascular: Atherosclerosis of skullbase vasculature without hyperdense vessel or abnormal calcification. Skull: No fracture or focal lesion. Sinuses/Orbits: Paranasal sinuses and mastoid air cells are clear. The visualized orbits are unremarkable. Other: Right frontal scalp hematoma. IMPRESSION: 1. Allowing for motion artifact, no acute intracranial abnormality. 2. Right frontal scalp hematoma. 3. Unchanged ventricular dilatation out of proportion to sulcal prominence, may be due to central atrophy or normal pressure hydrocephalus. 4. Moderate to advanced chronic small vessel ischemic change. 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 Electronically Signed   By: Narda Rutherford M.D.   On: 05/21/2022 23:10   DG Chest Portable 1 View  Result Date: 05/21/2022 CLINICAL DATA:  Short of breath EXAM: PORTABLE CHEST 1 VIEW COMPARISON:  11/27/2019 FINDINGS: Single frontal view of the chest demonstrates a stable cardiac silhouette. Continued ectasia of the thoracic aorta. Stable calcified right basilar nodule. There is patchy bilateral perihilar airspace disease greatest in the left upper and right mid lung zones. No effusion or pneumothorax. No acute bony abnormality. IMPRESSION: 1. Patchy multifocal bilateral airspace disease as above, favor pneumonia over edema. Electronically Signed   By:  Sharlet Salina M.D.   On: 05/21/2022 21:25     LOS: 1 day   Lanae Boast, MD [facility]  05/23/2022, 11:11 AM

## 2022-05-24 NOTE — Progress Notes (Addendum)
Progress Note   Patient: Ronald Matthews VQQ:[id number] DOB: May 30, 1933 DOA: 05/21/2022     2 DOS: the patient was seen and examined on 05/24/2022   Brief hospital course: 88 year old male with history of dementia CAD hypertension, HLD thrombocytopenia, previous CVA with left-sided weakness, history of seizure who is from the facility with hospice care sent to the ED due to acute change in mental status, fall after he was out in the sun his smoking with waiting jacket and pants, on return to inside temperature 104 and was acutely confused.  At baseline he is able to talk able to make his needs known, sister is the POA.  He was sent to the hospital for admission In the ED, hypotensive  65/40, hypoxic needing oxygen, labs showed significant acute renal failure creatinine 4.28, bicarb 20, elevated troponin 8576> 07/06/2002, leukocytosis 25.9 K, anemia 9.5 g, lactic acidosis 4.2.  Chest x-ray showed patchy multifocal bilateral airspace disease favoring pneumonia over edema,, CT head no acute finding, right frontal scalp hematoma, unchanged mental dilatation, moderate advanced chronic small vessel ischemic changes. Goals of care were discussed with patient's sister POA during admission-was explained poor prognosis-family wanted supportive measures with IV fluids and IV antibiotics and no further escalation of care or aggressive measure no CPR central line cardiac cath or pressure.  Patient received 2 L bolus normal saline, Narcan, vancomycin and cefepime.  He was admitted.  Patient remained critically ill-family came to visit during the night, ongoing discussion of goals of care-and family opted to to continue with comfort measures along with IV fluid hydration.   Assessment and Plan:  Goals of care/End of life care: Patient was under hospice care admitted with altered mental status.  Palliative care following closely he has multiple system involvement including sepsis pneumonia MRI hypoxia encephalopathy.Sister  is POA, palliative care discussed with her yesterday and also with daughter. -full comfort measures   Acute metabolic encephalopathy in the setting of dementia: Remains encephalopathic, with baseline dementia however was able to talk and make people aware of his need at baseline    Severe sepsis Multifocal pneumonia Lactic acidosis: Continue with end-of-life care, on IV fluids.  Antibiotic discontinued 7/21   NSTEMI:Not a candidate for further aggressive treatment. On EOL care   Acute hypoxic respiratory failure - on [place] Seizure disorder continue IV Keppra for now AKI with metabolic acidosis :creatinine in the 4 range, significantly elevated Possible heat exhaustion Anemia of chronic disease hb in 9 gm. History of CVA History of CAD HTN HLD   DVT prophylaxis: none for comfort Code Status:   Code Status: DNR Family Communication: plan of care discussed with sister on phone with nursing staff Palliative care yesterday      Subjective: Denies pain today, is comfortable.  Physical Exam: Vitals:   05/22/22 0637 05/22/22 0936 05/23/22 0915 05/24/22 0832  BP:  (!) 99/57 130/68 106/62  Pulse: 64 (!) 118 91 (!) 102  Resp:  10 10 20   Temp:  98.4 F (36.9 C) 98.6 F (37 C) 98.5 F (36.9 C)  TempSrc:  Oral    SpO2: 100% 97% (!) 55% 100%  Weight:      Height:       Physical Exam Vitals reviewed.  Constitutional:      General: He is not in acute distress.    Appearance: He is ill-appearing.  HENT:     Head: Normocephalic and atraumatic.     Mouth/Throat:     Mouth: Mucous membranes are dry.  Cardiovascular:  Rate and Rhythm: Normal rate.     Heart sounds: No murmur heard. Pulmonary:     Effort: Pulmonary effort is normal. No respiratory distress.     Breath sounds: No wheezing.  Abdominal:     General: Abdomen is flat. There is no distension.  Neurological:     Mental Status: He is alert. He is disoriented.     Data Reviewed:  There are no new results to  review at this time.  Family Communication: none at bedside  Disposition: Status is: Inpatient Remains inpatient appropriate because: EOL  Planned Discharge Destination:  Hospice    Time spent: 30 minutes  Author: Charolotte Eke, MD 05/24/2022 2:12 PM  For on call review [email or web address].

## 2022-05-25 DIAGNOSIS — R4182 Altered mental status, unspecified: Secondary | ICD-10-CM | POA: Diagnosis not present

## 2022-05-25 DIAGNOSIS — G9341 Metabolic encephalopathy: Secondary | ICD-10-CM | POA: Diagnosis not present

## 2022-05-25 DIAGNOSIS — Z515 Encounter for palliative care: Secondary | ICD-10-CM

## 2022-05-25 DIAGNOSIS — R569 Unspecified convulsions: Secondary | ICD-10-CM

## 2022-05-25 DIAGNOSIS — J9601 Acute respiratory failure with hypoxia: Secondary | ICD-10-CM

## 2022-05-25 DIAGNOSIS — R6521 Severe sepsis with septic shock: Secondary | ICD-10-CM

## 2022-05-25 DIAGNOSIS — Z7189 Other specified counseling: Secondary | ICD-10-CM

## 2022-05-25 DIAGNOSIS — Z66 Do not resuscitate: Secondary | ICD-10-CM | POA: Diagnosis not present

## 2022-05-25 DIAGNOSIS — A419 Sepsis, unspecified organism: Principal | ICD-10-CM

## 2022-05-25 DIAGNOSIS — I214 Non-ST elevation (NSTEMI) myocardial infarction: Secondary | ICD-10-CM

## 2022-05-25 NOTE — Progress Notes (Signed)
Daily Progress Note   Patient Name: Ronald Matthews       Date: 05/25/2022 DOB: [birth year]  Age: 88 y.o. MRN#: [id number] Attending Physician: Ronald Courser, MD Primary Care Physician: Ronald Proffer, MD Admit Date: 05/21/2022 Length of Stay: 3 days  Reason for Consultation/Follow-up: Establishing goals of care  HPI/Patient Profile:  88 y.o. male  with past medical history of dementia, CAD, HTN, HLD, thrombocytopenia, CVA with residual left-sided weakness, and history of seizure admitted from a facility with hospice care on 05/21/2022 with acute change in mental status and a fever.  Patient was hypotensive and hypoxic on admission with CT of head revealing no acute findings.  Goals of care were discussed with ED provider and patient's Ronald Matthews POA/Sister Ronald Matthews.  Family understands patient has a poor prognosis and wanted supportive measures with no aggressive treatments.  Family was in agreement to DNR/DNI.  PMT was consulted to continue goals of care discussion.  Subjective:   Subjective: Chart Reviewed. Updates received. Patient Assessed. Created space and opportunity for patient  and family to explore thoughts and feelings regarding current medical situation.  Today's Discussion: Today I met with the patient at the bedside.  He is awake and interactive, pleasantly confused.  He appears to have eaten this morning which is confirmed by the nurse.  There is question about an issue with his Foley and noted bladder distention.  Nursing plans to change out the Foley shortly.  The patient denies pain, dyspnea, agitation, anxiety.  No complaints at this time.  Later in the day I called and spoke with the patient's sister/POA Ronald Matthews.  We discussed that the patient has had some improvement and recommended continued comfort care but consider transition back to his living facility ([facility] in [place]) with hospice services Ronald Matthews) in place as was existing prior to his admission.  We discussed that this  to be a more comfortable environment for him rather than "lingering in the hospital" for multiple days or weeks.  She verbalized agreement.  She asked that she be notified of time for transfer to help her go to the right facility tomorrow when she comes to visit.  Ronald Matthews was notified.  I updated the hospitalist, Ronald Matthews, nursing about the current plan.  Ronald Matthews spoke with the patient's living facility who states he can take him back tomorrow morning.  I provided emotional general support through therapeutic listening, empathy, sharing of stories, and other techniques.  Answered all questions and addressed all concerns to the best my ability.  Review of Systems  Respiratory:  Negative for shortness of breath.   Cardiovascular:  Negative for chest pain.  Gastrointestinal:  Negative for abdominal pain, nausea and vomiting.  Genitourinary:        Bladder discomfort    Objective:   Vital Signs:  BP 111/65 (BP Location: Right Arm)   Pulse 75   Temp 98.3 F (36.8 C)   Resp 18   Ht 5\' 10"  (1.778 m)   Wt 76.9 kg   SpO2 100%   BMI 24.33 kg/m   Physical Exam: Physical Exam Vitals and nursing note reviewed.  Constitutional:      General: He is not in acute distress.    Appearance: He is ill-appearing.  HENT:     Head: Normocephalic and atraumatic.  Cardiovascular:     Rate and Rhythm: Normal rate.  Pulmonary:     Effort: Pulmonary effort is normal. No respiratory distress.  Genitourinary:    Comments: Lower abdominal fullness and mild  discomfort consistent with urinary retention (per RN 400 cc in bladder) Skin:    General: Skin is warm and dry.  Neurological:     Mental Status: He is alert.     Comments: Pleasantly confused     Palliative Assessment/Data: 30%   Assessment & Plan:   Impression: Present on Admission:  (Resolved) Coronary artery disease  Acute metabolic encephalopathy  Severe sepsis with septic shock (HCC)  Community acquired pneumonia  NSTEMI (non-ST elevated  myocardial infarction) (HCC)  Acute respiratory failure with hypoxia (HCC)  Anemia  AMS (altered mental status)  88 year old male with acute and chronic medical conditions as noted in previous consult as well as above.  He was made comfort care and anticipated in-hospital death over the weekend.  However, he seems to have improved somewhat to where he is interactive although pleasantly confused and eating somewhat/minimally.  After discussion with the patient sister/HCPOA it was decided to attempt to discharge him back to [facility] with pre-existing hospice services for end-of-life care.  Anticipate discharge tomorrow morning.  We will continue to follow for symptom management until such time as he is discharged.  Overall prognosis poor.  SUMMARY OF RECOMMENDATIONS   Continue DNR Continue comfort care Anticipate discharge back to [facility] with hospice tomorrow PMT will continue to follow for comfort care until discharge  Symptom Management:  Robinul 0.2 mg IV every 6 hours as needed excessive secretions Haldol 0.5 mg IV every 4 hours as needed agitation delirium Morphine 2 mg IV every hour as needed moderate pain, dyspnea Zofran 4 mg IV every 6 hours as needed nausea or vomiting Polyvinyl alcohol 1.4% ophthalmic solution 1 drop OU 4 times daily as needed dry eyes  Code Status: DNR  Prognosis: < 2 weeks  Discharge Planning: Skilled Nursing Facility with Hospice  Discussed with: Medical team, nursing team, [facility] team, patient, patient's family  Thank you for allowing [place] to participate in the care of Ronald Matthews PMT will continue to support holistically.  Time Total: 90 min  Visit consisted of counseling and education dealing with the complex and emotionally intense issues of symptom management and palliative care in the setting of serious and potentially life-threatening illness. Greater than 50%  of this time was spent counseling and coordinating care related to the above  assessment and plan.  Ronald Dust, NP Palliative Medicine Team  Team Phone # [phone] (Nights/Weekends)  07/01/2021, 8:17 AM

## 2022-05-25 NOTE — Care Management Important Message (Signed)
Important Message  Patient Details  Name: Ronald Matthews MRN: [id number] Date of Birth: Apr 26, 1933   Medicare Important Message Given:  Other (see comment)  Patient is on comfort care. Out of respect for the patient and family no Important Message from [facility] given.  Olegario Messier A Audry Pecina 05/25/2022, 9:55 AM

## 2022-05-25 NOTE — Progress Notes (Signed)
Progress Note   Patient: Ronald Matthews XTK:[id number] DOB: Jan 29, 1933 DOA: 05/21/2022     3 DOS: the patient was seen and examined on 05/25/2022   Brief hospital course: 88 year old male with history of dementia CAD hypertension, HLD thrombocytopenia, previous CVA with left-sided weakness, history of seizure who is from the facility with hospice care sent to the ED due to acute change in mental status, fall after he was out in the sun his smoking with waiting jacket and pants, on return to inside temperature 104 and was acutely confused.  At baseline he is able to talk able to make his needs known, sister is the POA.  He was sent to the hospital for admission In the ED, hypotensive  65/40, hypoxic needing oxygen, labs showed significant acute renal failure creatinine 4.28, bicarb 20, elevated troponin 8576> 07/06/2002, leukocytosis 25.9 K, anemia 9.5 g, lactic acidosis 4.2.  Chest x-ray showed patchy multifocal bilateral airspace disease favoring pneumonia over edema,, CT head no acute finding, right frontal scalp hematoma, unchanged mental dilatation, moderate advanced chronic small vessel ischemic changes. Goals of care were discussed with patient's sister POA during admission-was explained poor prognosis-family wanted supportive measures with IV fluids and IV antibiotics and no further escalation of care or aggressive measure no CPR central line cardiac cath or pressure.  Patient received 2 L bolus normal saline, Narcan, vancomycin and cefepime.  He was admitted.  Patient remained critically ill-family came to visit during the night, ongoing discussion of goals of care-and family opted to to continue with comfort measures along with IV fluid hydration.  7/24: Patient becomes more interactive and started eating.  Discussed with sister who is also his POA and decided to discharge back to his facility with hospice care.  Facility should be able to take him back tomorrow.  Apparently he was discharged from  prior hospice .  Palliative care on board.   Assessment and Plan: * Acute metabolic encephalopathy May be secondary to dehydration and poor p.o. intake. Sister who is also his POA decided to proceed with comfort measures only. Appears to have improvement in mental status today. -Can be discharged back to his facility with hospice services    Severe sepsis with septic shock (HCC) Initial concern of sepsis most likely secondary to pneumonia versus UTI.  Also had AKI and lactic acidosis.  Received broad-spectrum antibiotics and IV fluid with improvement in blood pressure. Family later decided to proceed with comfort care only and antibiotics were discontinued.    Community acquired pneumonia -Currently stable on comfort measures   NSTEMI (non-ST elevated myocardial infarction) (HCC) NSTEMI with a rising troponin of  Latest Reference Range & Units 05/21/22 22:20 05/21/22 22:58  Troponin I (High Sensitivity) <18 ng/L 8,576 (HH) 9,403 (HH)  Antiplatelet therapy currently held due to patient's anemia. Beta-blocker held due to hypotension. Aspirin held due to anemia. Statin held as patient is currently altered and NPO. Suspect combination of NSTEMI as patient has a history of CAD, and sepsis related troponin elevation. Conservative measures after discussing with sister cardiology consult if desired by family member. >>Patient's last echocardiogram shows: 1. Severe dyskinesis of the left ventricular, basal inferoseptal wall.  2. The left ventricle has normal systolic function, with an ejection  fraction of 55-60%. The cavity size was normal. There is moderate to  severe focal basal hypertrophy of the septum with otherwise mild left  ventricular wall thickening. Left ventricular  diastolic Doppler parameters are consistent with impaired relaxation.  3. The right ventricle has  normal systolic function. The cavity was  normal. There is mildly increased right ventricular wall thickness.  Right  ventricular systolic pressure could not be assessed.  4. The mitral valve is degenerative. Mild thickening of the mitral valve  leaflet.  5. The aortic valve is tricuspid. Mild thickening of the aortic valve.  Mild calcification of the aortic valve. Aortic valve regurgitation is  trivial by color flow Doppler.  6. The aortic root is normal in size and structure.  7. The interatrial septum was not well visualized.   Family decided to proceed with comfort care only.  Not a candidate for aggressive treatment  Seizure (HCC) -Continue with Keppra Seizure precautions.   Anemia Type and screen. IV ppi. Transfuse as deemed appropriate with volume overload.   Acute respiratory failure with hypoxia (HCC) Secondary to pneumonia. Supportive care with supplemental oxygen.     Subjective: Patient was sitting upright and eating when seen during morning rounds.  Denies any pain.  He was more interactive.  Physical Exam: Vitals:   05/24/22 0832 05/24/22 2148 05/25/22 0903 05/25/22 1608  BP: 106/62 128/77 111/65 120/69  Pulse: (!) 102 95 75 87  Resp: 20 18    Temp: 98.5 F (36.9 C) 98.3 F (36.8 C) 98.3 F (36.8 C)   TempSrc:  Oral    SpO2: 100% 100% 100% 100%  Weight:      Height:       General.  Frail elderly man, in no acute distress. Pulmonary.  Lungs clear bilaterally, normal respiratory effort. CV.  Regular rate and rhythm, no JVD, rub or murmur. Abdomen.  Soft, nontender, nondistended, BS positive. CNS.  Alert and oriented to self.  No focal neurologic deficit. Extremities.  No edema, no cyanosis, pulses intact and symmetrical. Psychiatry.  Judgment and insight appears impaired  Data Reviewed: Prior data reviewed  Family Communication: With sister on phone  Disposition: Status is: Inpatient Remains inpatient appropriate because: Severity of illness, no comfort care and will be discharged back to his facility with hospice services tomorrow.   Planned  Discharge Destination: Skilled nursing facility  Time spent: 50 minutes  This record has been created using Conservation officer, historic buildings. Errors have been sought and corrected,but may not always be located. Such creation errors do not reflect on the standard of care.  Author: Arnetha Courser, MD 05/25/2022 4:48 PM  For on call review [email or web address].

## 2022-05-26 DIAGNOSIS — R4182 Altered mental status, unspecified: Secondary | ICD-10-CM | POA: Diagnosis not present

## 2022-05-26 DIAGNOSIS — Z7189 Other specified counseling: Secondary | ICD-10-CM | POA: Diagnosis not present

## 2022-05-26 DIAGNOSIS — Z515 Encounter for palliative care: Secondary | ICD-10-CM | POA: Diagnosis not present

## 2022-05-26 DIAGNOSIS — G9341 Metabolic encephalopathy: Secondary | ICD-10-CM | POA: Diagnosis not present

## 2022-05-26 MED ORDER — ONDANSETRON 4 MG PO TBDP: 4.0000 mg | ORAL_TABLET | Freq: Four times a day (QID) | ORAL | 0 refills | Status: DC | PRN

## 2022-05-26 NOTE — TOC Transition Note (Signed)
Transition of Care [facility]) - CM/SW Discharge Note   Patient Details  Name: Ronald Matthews MRN: [id number] Date of Birth: 07-Sep-1933  Transition of Care [facility]) CM/SW Contact:  Maree Krabbe, LCSW Phone Number: 05/26/2022, 1:20 PM   Clinical Narrative:   Pt dc back to [facility] with [facility]. Clinicals faxed to [facility] with [facility]. Per admission with [facility] they are just requesting we send dc summary back with pt.    Final next level of care: Assisted Living Barriers to Discharge: No Barriers Identified   Patient Goals and CMS Choice        Discharge Placement              Patient chooses bed at:  [facility]) Patient to be transferred to facility by: [facility] Name of family member notified: [name] Patient and family notified of of transfer: 05/26/22  Discharge Plan and Services                                     Social Determinants of Health (SDOH) Interventions     Readmission Risk Interventions     No data to display

## 2022-05-26 NOTE — Progress Notes (Addendum)
Daily Progress Note   Patient Name: Ronald Matthews       Date: 05/26/2022 DOB: 1933/01/16  Age: 88 y.o. MRN#: [id number] Attending Physician: Lorella Nimrod, MD Primary Care Physician: Bonnita Nasuti, MD Admit Date: 05/21/2022 Length of Stay: 4 days  Reason for Consultation/Follow-up: Establishing goals of care  HPI/Patient Profile:  88 y.o. male  with past medical history of dementia, CAD, HTN, HLD, thrombocytopenia, CVA with residual left-sided weakness, and history of seizure admitted from a facility with hospice care on 05/21/2022 with acute change in mental status and a fever.  Patient was hypotensive and hypoxic on admission with CT of head revealing no acute findings.  Goals of care were discussed with ED provider and patient's [facility] POA/Sister [name].  Family understands patient has a poor prognosis and wanted supportive measures with no aggressive treatments.  Family was in agreement to DNR/DNI.  PMT was consulted to continue goals of care discussion.  Subjective:   Subjective: Chart Reviewed. Updates received. Patient Assessed. Created space and opportunity for patient  and family to explore thoughts and feelings regarding current medical situation.  Today's Discussion: Today I met with the patient at the bedside.  He is awake and interactive, pleasantly confused.  He appears to have eaten this morning a minimal amount.  The patient denies pain, dyspnea, agitation, anxiety.  No complaints at this time. Informed him the plan is to go back to his home at [facility]. He seems happy with the thought of his sister coming to visit him today here or at [facility].  I provided emotional general support through therapeutic listening, empathy, sharing of stories, and other techniques.  Answered all questions and addressed all concerns to the best my ability.  Review of Systems  Respiratory:  Negative for shortness of breath.   Cardiovascular:  Negative for chest pain.  Gastrointestinal:   Negative for abdominal pain, nausea and vomiting.    Objective:   Vital Signs:  BP 119/69 (BP Location: Left Arm)   Pulse 80   Temp 98.4 F (36.9 C)   Resp 16   Ht '5\' 10"'  (1.778 m)   Wt 76.9 kg   SpO2 100%   BMI 24.33 kg/m   Physical Exam: Physical Exam Vitals and nursing note reviewed.  Constitutional:      General: He is not in acute distress.    Appearance: He is ill-appearing.  HENT:     Head: Normocephalic and atraumatic.  Cardiovascular:     Rate and Rhythm: Normal rate.  Pulmonary:     Effort: Pulmonary effort is normal. No respiratory distress.  Genitourinary:    Comments: Lower abdomen no longer distended after replacement of foley yesterday Skin:    General: Skin is warm and dry.  Neurological:     Mental Status: He is alert.     Comments: Pleasantly confused     Palliative Assessment/Data: 30%   Assessment & Plan:   Impression: Present on Admission:  (Resolved) Coronary artery disease  Acute metabolic encephalopathy  Severe sepsis with septic shock (HCC)  Community acquired pneumonia  NSTEMI (non-ST elevated myocardial infarction) ([place])  Acute respiratory failure with hypoxia ([place])  Anemia  88 year old male with acute and chronic medical conditions as noted in previous consult as well as above.  He was made comfort care and anticipated in-hospital death over the weekend.  However, he seems to have improved somewhat to where he is interactive although pleasantly confused and eating somewhat/minimally.  After discussion with the patient  sister/HCPOA yesterday it was decided to attempt to discharge him back to [place] with pre-existing hospice services for end-of-life care.  Anticipate discharge this morning.  We will continue to follow for symptom management until such time as he is discharged.  Overall prognosis poor.  SUMMARY OF RECOMMENDATIONS   Continue DNR Continue comfort care Anticipate discharge back to [place] with hospice  tomorrow PMT will continue to follow for comfort care until discharge  Symptom Management:  Robinul 0.2 mg IV every 6 hours as needed excessive secretions Haldol 0.5 mg IV every 4 hours as needed agitation delirium Morphine 2 mg IV every hour as needed moderate pain, dyspnea Zofran 4 mg IV every 6 hours as needed nausea or vomiting Polyvinyl alcohol 1.4% ophthalmic solution 1 drop OU 4 times daily as needed dry eyes  Code Status: DNR  Prognosis: < 2 weeks  Discharge Planning: [place] with Hospice  Discussed with: Medical team, nursing team, [facility] team, patient, patient's family  Thank you for allowing [place] to participate in the care of Ronald Matthews PMT will continue to support holistically.  Time Total: 45 min  Visit consisted of counseling and education dealing with the complex and emotionally intense issues of symptom management and palliative care in the setting of serious and potentially life-threatening illness. Greater than 50%  of this time was spent counseling and coordinating care related to the above assessment and plan.  Walden Field, NP Palliative Medicine Team  Team Phone # [phone] (Nights/Weekends)  07/01/2021, 8:17 AM

## 2022-05-26 NOTE — Discharge Summary (Addendum)
Physician Discharge Summary   Patient: Ronald Matthews MRN: [id number] DOB: [birth year]  Admit date:     05/21/2022  Discharge date: 05/26/22  Discharge Physician: Arnetha Courser   PCP: Galvin Proffer, MD   Recommendations at discharge:  Patient is being discharged back to his long-term facility with hospice services.  Discharge Diagnoses: Principal Problem:   Acute metabolic encephalopathy Active Problems:   Severe sepsis with septic shock (HCC)   Community acquired pneumonia   NSTEMI (non-ST elevated myocardial infarction) (HCC)   Seizure (HCC)   Acute respiratory failure with hypoxia (HCC)   Anemia   Hospital Course: 88 year old male with history of dementia CAD hypertension, HLD thrombocytopenia, previous CVA with left-sided weakness, history of seizure who is from the facility with hospice care sent to the ED due to acute change in mental status, fall after he was out in the sun his smoking with waiting jacket and pants, on return to inside temperature 104 and was acutely confused.  At baseline he is able to talk able to make his needs known, sister is the POA.  He was sent to the hospital for admission In the ED, hypotensive  65/40, hypoxic needing oxygen, labs showed significant acute renal failure creatinine 4.28, bicarb 20, elevated troponin 8576> 07/06/2002, leukocytosis 25.9 K, anemia 9.5 g, lactic acidosis 4.2.  Chest x-ray showed patchy multifocal bilateral airspace disease favoring pneumonia over edema,, CT head no acute finding, right frontal scalp hematoma, unchanged mental dilatation, moderate advanced chronic small vessel ischemic changes. Goals of care were discussed with patient's sister POA during admission-was explained poor prognosis-family wanted supportive measures with IV fluids and IV antibiotics and no further escalation of care or aggressive measure no CPR central line cardiac cath or pressure.  Patient received 2 L bolus normal saline, Narcan, vancomycin and cefepime.   He was admitted.  Patient remained critically ill-family came to visit during the night, ongoing discussion of goals of care-and family opted to to continue with comfort measures along with IV fluid hydration.  7/24: Patient becomes more interactive and started eating.  Discussed with sister who is also his POA and decided to discharge back to his facility with hospice care.  Facility should be able to take him back tomorrow.  Apparently he was discharged from prior hospice .  Palliative care on board.  7/25: Remains stable and is being discharged back to [place] [facility] with hospice services. Patient will continue on 2 to 3 L of oxygen and will use nebulizer treatments as needed.  Assessment and Plan: * Acute metabolic encephalopathy May be secondary to dehydration and poor p.o. intake. Sister who is also his POA decided to proceed with comfort measures only. Appears to have improvement in mental status today. -Can be discharged back to his facility with hospice services    Severe sepsis with septic shock (HCC) Initial concern of sepsis most likely secondary to pneumonia versus UTI.  Also had AKI and lactic acidosis.  Received broad-spectrum antibiotics and IV fluid with improvement in blood pressure. Family later decided to proceed with comfort care only and antibiotics were discontinued.    Community acquired pneumonia -Currently stable on comfort measures   NSTEMI (non-ST elevated myocardial infarction) (HCC) NSTEMI with a rising troponin of  Latest Reference Range & Units 05/21/22 22:20 05/21/22 22:58  Troponin I (High Sensitivity) <18 ng/L 8,576 (HH) 9,403 (HH)  Antiplatelet therapy currently held due to patient's anemia. Beta-blocker held due to hypotension. Aspirin held due to anemia. Statin held as patient  is currently altered and NPO. Suspect combination of NSTEMI as patient has a history of CAD, and sepsis related troponin elevation. Conservative measures after  discussing with sister cardiology consult if desired by family member. >>Patient's last echocardiogram shows:  1. Severe dyskinesis of the left ventricular, basal inferoseptal wall.  2. The left ventricle has normal systolic function, with an ejection  fraction of 55-60%. The cavity size was normal. There is moderate to  severe focal basal hypertrophy of the septum with otherwise mild left  ventricular wall thickening. Left ventricular   diastolic Doppler parameters are consistent with impaired relaxation.   3. The right ventricle has normal systolic function. The cavity was  normal. There is mildly increased right ventricular wall thickness. Right  ventricular systolic pressure could not be assessed.   4. The mitral valve is degenerative. Mild thickening of the mitral valve  leaflet.   5. The aortic valve is tricuspid. Mild thickening of the aortic valve.  Mild calcification of the aortic valve. Aortic valve regurgitation is  trivial by color flow Doppler.   6. The aortic root is normal in size and structure.   7. The interatrial septum was not well visualized.   Family decided to proceed with comfort care only.  Not a candidate for aggressive treatment  Seizure (HCC) -Continue with Keppra Seizure precautions.   Anemia Type and screen. IV ppi. Transfuse as deemed appropriate with volume overload.   Acute respiratory failure with hypoxia (HCC) Secondary to pneumonia. Supportive care with supplemental oxygen.     Consultants: Palliative care Procedures performed: None Disposition: Hospice care Diet recommendation:  Discharge Diet Orders (From admission, onward)     Start     Ordered   05/26/22 0000  Diet - low sodium heart healthy        05/26/22 1210           Regular diet DISCHARGE MEDICATION: Allergies as of 05/26/2022       Reactions   Cephalexin Other (See Comments)   Ppd [tuberculin Purified Protein Derivative] Other (See Comments)   "Allergic," per  MAR   Tuberculin Tests Other (See Comments)   "Allergic," per [facility]        Medication List     STOP taking these medications    atorvastatin 40 MG tablet Commonly known as: LIPITOR       TAKE these medications    acetaminophen 325 MG tablet Commonly known as: TYLENOL Take 2 tablets (650 mg total) by mouth every 4 (four) hours as needed for mild pain (or temp > 37.5 C (99.5 F)). What changed:  when to take this reasons to take this additional instructions   aspirin 81 MG chewable tablet Chew 81 mg by mouth in the morning.   Aveeno Eczema Therapy 1 % Crea Generic drug: Colloidal Oatmeal Apply 1 application topically See admin instructions. Apply to affected areas 2 times a day   baclofen 10 MG tablet Commonly known as: LIORESAL Take 10 mg by mouth 3 (three) times daily.   brimonidine 0.2 % ophthalmic solution Commonly known as: ALPHAGAN Place 1 drop into both eyes 2 (two) times daily.   brinzolamide 1 % ophthalmic suspension Commonly known as: AZOPT Place 1 drop into both eyes in the morning and at bedtime.   feeding supplement (PRO-STAT SUGAR FREE 64) Liqd Take 30 mLs by mouth daily. CHERRY   hydrocortisone cream 1 % Apply 1 application topically 2 (two) times daily.   lactose free nutrition Liqd Take 237 mLs  by mouth 3 (three) times daily. VANILLA   levETIRAcetam 500 MG tablet Commonly known as: KEPPRA Take 1 tablet (500 mg total) by mouth 2 (two) times daily.   loperamide 2 MG tablet Commonly known as: IMODIUM A-D Take 2 mg by mouth as needed (after each loose stool- up to 4 doses in 12 hours and call MD if this diarrhea persists longer than 12 hours or is accompanied by severe abdominal pain).   Melatonin 10 MG Tabs Take 10 mg by mouth at bedtime as needed.   metoprolol succinate 25 MG 24 hr tablet Commonly known as: TOPROL-XL Take 12.5 mg by mouth daily.   multivitamins ther. w/minerals Tabs tablet Take 1 tablet by mouth daily.   Mylanta  200-200-20 MG/5ML suspension Generic drug: alum & mag hydroxide-simeth Take 30 mLs by mouth once as needed for indigestion or heartburn.   nitroGLYCERIN 0.4 MG SL tablet Commonly known as: NITROSTAT Place 0.4 mg under the tongue every 5 (five) minutes x 3 doses as needed for chest pain (AND CALL EMS IF NO RESOLUTION).   ondansetron 4 MG disintegrating tablet Commonly known as: ZOFRAN-ODT Take 1 tablet (4 mg total) by mouth every 6 (six) hours as needed for nausea.   Pepto-Bismol 262 MG/15ML suspension Generic drug: bismuth subsalicylate Take 15 mLs by mouth every 2 (two) hours as needed for indigestion or diarrhea or loose stools (max of 6 doses in 24 hours).   senna-docusate 8.6-50 MG tablet Commonly known as: Senokot-S Take 1 tablet by mouth at bedtime as needed for mild constipation.   Travoprost (BAK Free) 0.004 % Soln ophthalmic solution Commonly known as: TRAVATAN Place 1 drop into both eyes at bedtime.   traZODone 50 MG tablet Commonly known as: DESYREL Take 50 mg by mouth at bedtime.   zinc oxide 20 % ointment Apply 1 application  topically See admin instructions. Apply topically to sacrum twice daily        Follow-up Information     Hague, Myrene Galas, MD. Schedule an appointment as soon as possible for a visit in 1 week(s).   Specialty: Internal Medicine Contact information: [address] [place] [address] [phone]                Discharge Exam: Ceasar Mons Weights   05/22/22 0252  Weight: 76.9 kg   General.  Frail gentleman, in no acute distress. Pulmonary.  Lungs clear bilaterally, normal respiratory effort. CV.  Regular rate and rhythm, no JVD, rub or murmur. Abdomen.  Soft, nontender, nondistended, BS positive. CNS.  Alert and oriented .  No focal neurologic deficit. Extremities.  No edema, no cyanosis, pulses intact and symmetrical. Psychiatry.  Judgment and insight appears normal.   Condition at discharge: stable  The results of  significant diagnostics from this hospitalization (including imaging, microbiology, ancillary and laboratory) are listed below for reference.   Imaging Studies: CT HEAD WO CONTRAST ( )  Result Date: 05/21/2022 CLINICAL DATA:  Mental status change, unknown cause EXAM: CT HEAD WITHOUT CONTRAST TECHNIQUE: Contiguous axial images were obtained from the base of the skull through the vertex without intravenous contrast. RADIATION DOSE REDUCTION: This exam was performed according to the departmental dose-optimization program which includes automated exposure control, adjustment of the mA and/or kV according to patient size and/or use of iterative reconstruction technique. COMPARISON:  Head CT 06/22/2020 FINDINGS: Brain: Despite repeat acquisition there is moderate motion artifact. Allowing for motion, no evidence of acute intracranial hemorrhage. There is ventricular dilatation out of proportion to  sulcal prominence, however this is stable from prior exam. Moderate to advanced periventricular chronic small vessel ischemic change. Prominent cerebellar atrophy. 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 There is no subdural or extra-axial collection. Vascular: Atherosclerosis of skullbase vasculature without hyperdense vessel or abnormal calcification. Skull: No fracture or focal lesion. Sinuses/Orbits: Paranasal sinuses and mastoid air cells are clear. The visualized orbits are unremarkable. Other: Right frontal scalp hematoma. IMPRESSION: 1. Allowing for motion artifact, no acute intracranial abnormality. 2. Right frontal scalp hematoma. 3. Unchanged ventricular dilatation out of proportion to sulcal prominence, may be due to central atrophy or normal pressure hydrocephalus. 4. Moderate to advanced chronic small vessel ischemic change. 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 Electronically Signed   By: Narda Rutherford M.D.   On: 05/21/2022 23:10    DG Chest Portable 1 View  Result Date: 05/21/2022 CLINICAL DATA:  Short of breath EXAM: PORTABLE CHEST 1 VIEW COMPARISON:  11/27/2019 FINDINGS: Single frontal view of the chest demonstrates a stable cardiac silhouette. Continued ectasia of the thoracic aorta. Stable calcified right basilar nodule. There is patchy bilateral perihilar airspace disease greatest in the left upper and right mid lung zones. No effusion or pneumothorax. No acute bony abnormality. IMPRESSION: 1. Patchy multifocal bilateral airspace disease as above, favor pneumonia over edema. Electronically Signed   By: Sharlet Salina M.D.   On: 05/21/2022 21:25    Microbiology: Results for orders placed or performed during the hospital encounter of 05/21/22  MRSA Next Gen by PCR, Nasal     Status: None   Collection Time: 05/22/22  4:31 AM   Specimen: Nasal Mucosa; Nasal Swab  Result Value Ref Range Status   MRSA by PCR Next Gen NOT DETECTED NOT DETECTED Final    Comment: (NOTE) The GeneXpert MRSA Assay (FDA approved for NASAL specimens only), is one component of a comprehensive MRSA colonization surveillance program. It is not intended to diagnose MRSA infection nor to guide or monitor treatment for MRSA infections. Test performance is not FDA approved in patients less than 19 years old. Performed at [facility], [address] [place], [place] [address]     Labs: CBC: Recent Labs  Lab 05/21/22 2220 05/22/22 0255  WBC 25.9* 34.2*  NEUTROABS 23.6*  --   HGB 9.5* 9.0*  HCT 31.1* 29.1*  MCV 87.6 89.3  PLT 272 240   Basic Metabolic Panel: Recent Labs  Lab 05/21/22 2220 05/22/22 0255 05/22/22 0436  NA 137 137  --   K 4.8 5.0  --   CL 105 107  --   CO2 20* 18*  --   GLUCOSE 111* 100*  --   BUN 54* 54*  --   CREATININE 4.28* 3.92* 4.01*  CALCIUM 8.3* 8.1*  --    Liver Function Tests: Recent Labs  Lab 05/21/22 2220 05/22/22 0255  AST 67* 64*  ALT 19 19  ALKPHOS 84 94  BILITOT 0.7 0.4  PROT  7.7 7.2  ALBUMIN 3.4* 3.2*   CBG: Recent Labs  Lab 05/21/22 2044  GLUCAP 113*    Discharge time spent: greater than 30 minutes.  This record has been created using Conservation officer, historic buildings. Errors have been sought and corrected,but may not always be located. Such creation errors do not reflect on the standard of care.   Signed: Arnetha Courser, MD [facility] 05/26/2022

## 2022-05-26 NOTE — Progress Notes (Addendum)
Pt picked up by EMS at this time to go to [facility] with hospice services. This RN phoned Sister Seward Grater to let her know of discharge. PIV removed. Foley in place with light pink output- no clots visualized. Patient able to use wheelchair for mobility per sister. No report required. No further needs.

## 2022-07-25 ENCOUNTER — Inpatient Hospital Stay
Admission: EM | Admit: 2022-07-25 | Discharge: 2022-08-03 | DRG: 871 | Disposition: A | Discharge status: Hospice | Payer: Medicare Other | Source: Skilled Nursing Facility | Attending: Internal Medicine | Admitting: Internal Medicine

## 2022-07-25 ENCOUNTER — Encounter: Payer: Self-pay | Admitting: Internal Medicine

## 2022-07-25 ENCOUNTER — Other Ambulatory Visit: Payer: Self-pay

## 2022-07-25 DIAGNOSIS — N3289 Other specified disorders of bladder: Secondary | ICD-10-CM | POA: Diagnosis present

## 2022-07-25 DIAGNOSIS — R6520 Severe sepsis without septic shock: Secondary | ICD-10-CM | POA: Diagnosis present

## 2022-07-25 DIAGNOSIS — Z87898 Personal history of other specified conditions: Secondary | ICD-10-CM

## 2022-07-25 DIAGNOSIS — R310 Gross hematuria: Secondary | ICD-10-CM | POA: Diagnosis present

## 2022-07-25 DIAGNOSIS — F0150 Vascular dementia without behavioral disturbance: Secondary | ICD-10-CM | POA: Diagnosis not present

## 2022-07-25 DIAGNOSIS — I129 Hypertensive chronic kidney disease with stage 1 through stage 4 chronic kidney disease, or unspecified chronic kidney disease: Secondary | ICD-10-CM | POA: Diagnosis present

## 2022-07-25 DIAGNOSIS — H409 Unspecified glaucoma: Secondary | ICD-10-CM | POA: Diagnosis present

## 2022-07-25 DIAGNOSIS — Z7982 Long term (current) use of aspirin: Secondary | ICD-10-CM | POA: Diagnosis not present

## 2022-07-25 DIAGNOSIS — N182 Chronic kidney disease, stage 2 (mild): Secondary | ICD-10-CM | POA: Diagnosis present

## 2022-07-25 DIAGNOSIS — Z79899 Other long term (current) drug therapy: Secondary | ICD-10-CM

## 2022-07-25 DIAGNOSIS — F17200 Nicotine dependence, unspecified, uncomplicated: Secondary | ICD-10-CM | POA: Diagnosis present

## 2022-07-25 DIAGNOSIS — I2510 Atherosclerotic heart disease of native coronary artery without angina pectoris: Secondary | ICD-10-CM | POA: Diagnosis present

## 2022-07-25 DIAGNOSIS — N1831 Chronic kidney disease, stage 3a: Secondary | ICD-10-CM | POA: Diagnosis not present

## 2022-07-25 DIAGNOSIS — A419 Sepsis, unspecified organism: Secondary | ICD-10-CM | POA: Diagnosis not present

## 2022-07-25 DIAGNOSIS — I252 Old myocardial infarction: Secondary | ICD-10-CM

## 2022-07-25 DIAGNOSIS — Z993 Dependence on wheelchair: Secondary | ICD-10-CM | POA: Diagnosis not present

## 2022-07-25 DIAGNOSIS — N136 Pyonephrosis: Secondary | ICD-10-CM | POA: Diagnosis present

## 2022-07-25 DIAGNOSIS — E440 Moderate protein-calorie malnutrition: Secondary | ICD-10-CM | POA: Diagnosis present

## 2022-07-25 DIAGNOSIS — D62 Acute posthemorrhagic anemia: Secondary | ICD-10-CM | POA: Diagnosis not present

## 2022-07-25 DIAGNOSIS — N3001 Acute cystitis with hematuria: Secondary | ICD-10-CM | POA: Diagnosis present

## 2022-07-25 DIAGNOSIS — Z8673 Personal history of transient ischemic attack (TIA), and cerebral infarction without residual deficits: Secondary | ICD-10-CM

## 2022-07-25 DIAGNOSIS — K5641 Fecal impaction: Secondary | ICD-10-CM | POA: Diagnosis present

## 2022-07-25 DIAGNOSIS — E861 Hypovolemia: Secondary | ICD-10-CM | POA: Diagnosis present

## 2022-07-25 DIAGNOSIS — I471 Supraventricular tachycardia, unspecified: Secondary | ICD-10-CM | POA: Diagnosis present

## 2022-07-25 DIAGNOSIS — Z515 Encounter for palliative care: Secondary | ICD-10-CM

## 2022-07-25 DIAGNOSIS — A4151 Sepsis due to Escherichia coli [E. coli]: Secondary | ICD-10-CM | POA: Diagnosis present

## 2022-07-25 DIAGNOSIS — R319 Hematuria, unspecified: Secondary | ICD-10-CM | POA: Diagnosis not present

## 2022-07-25 DIAGNOSIS — N390 Urinary tract infection, site not specified: Secondary | ICD-10-CM | POA: Diagnosis not present

## 2022-07-25 DIAGNOSIS — E872 Acidosis, unspecified: Secondary | ICD-10-CM | POA: Diagnosis present

## 2022-07-25 DIAGNOSIS — Z8679 Personal history of other diseases of the circulatory system: Secondary | ICD-10-CM

## 2022-07-25 DIAGNOSIS — I214 Non-ST elevation (NSTEMI) myocardial infarction: Secondary | ICD-10-CM | POA: Diagnosis present

## 2022-07-25 DIAGNOSIS — F0390 Unspecified dementia without behavioral disturbance: Secondary | ICD-10-CM | POA: Diagnosis present

## 2022-07-25 DIAGNOSIS — Z6822 Body mass index (BMI) 22.0-22.9, adult: Secondary | ICD-10-CM

## 2022-07-25 DIAGNOSIS — Z66 Do not resuscitate: Secondary | ICD-10-CM | POA: Diagnosis present

## 2022-07-25 DIAGNOSIS — R569 Unspecified convulsions: Secondary | ICD-10-CM

## 2022-07-25 DIAGNOSIS — Z7189 Other specified counseling: Secondary | ICD-10-CM | POA: Diagnosis not present

## 2022-07-25 DIAGNOSIS — I21A1 Myocardial infarction type 2: Secondary | ICD-10-CM | POA: Diagnosis present

## 2022-07-25 DIAGNOSIS — N179 Acute kidney failure, unspecified: Secondary | ICD-10-CM | POA: Diagnosis present

## 2022-07-25 HISTORY — DX: Unspecified glaucoma: H40.9

## 2022-07-25 LAB — CBC WITH DIFFERENTIAL/PLATELET
Abs Immature Granulocytes: 0.02 10*3/uL (ref 0.00–0.07)
Basophils Absolute: 0 10*3/uL (ref 0.0–0.1)
Basophils Relative: 0 %
Eosinophils Absolute: 0 10*3/uL (ref 0.0–0.5)
Eosinophils Relative: 1 %
HCT: 42.7 % (ref 39.0–52.0)
Hemoglobin: 13.1 g/dL (ref 13.0–17.0)
Immature Granulocytes: 0 %
Lymphocytes Relative: 9 %
Lymphs Abs: 0.6 10*3/uL — ABNORMAL LOW (ref 0.7–4.0)
MCH: 26.5 pg (ref 26.0–34.0)
MCHC: 30.7 g/dL (ref 30.0–36.0)
MCV: 86.3 fL (ref 80.0–100.0)
Monocytes Absolute: 0.4 10*3/uL (ref 0.1–1.0)
Monocytes Relative: 7 %
Neutro Abs: 5.5 10*3/uL (ref 1.7–7.7)
Neutrophils Relative %: 83 %
Platelets: 204 10*3/uL (ref 150–400)
RBC: 4.95 MIL/uL (ref 4.22–5.81)
RDW: 17.1 % — ABNORMAL HIGH (ref 11.5–15.5)
WBC: 6.6 10*3/uL (ref 4.0–10.5)
nRBC: 0 % (ref 0.0–0.2)

## 2022-07-25 LAB — COMPREHENSIVE METABOLIC PANEL
ALT: 11 U/L (ref 0–44)
AST: 22 U/L (ref 15–41)
Albumin: 3.1 g/dL — ABNORMAL LOW (ref 3.5–5.0)
Alkaline Phosphatase: 75 U/L (ref 38–126)
Anion gap: 8 (ref 5–15)
BUN: 33 mg/dL — ABNORMAL HIGH (ref 8–23)
CO2: 21 mmol/L — ABNORMAL LOW (ref 22–32)
Calcium: 8.5 mg/dL — ABNORMAL LOW (ref 8.9–10.3)
Chloride: 109 mmol/L (ref 98–111)
Creatinine, Ser: 1.6 mg/dL — ABNORMAL HIGH (ref 0.61–1.24)
GFR, Estimated: 41 mL/min — ABNORMAL LOW (ref >60)
Glucose, Bld: 188 mg/dL — ABNORMAL HIGH (ref 70–99)
Potassium: 4.7 mmol/L (ref 3.5–5.1)
Sodium: 138 mmol/L (ref 135–145)
Total Bilirubin: 0.5 mg/dL (ref 0.3–1.2)
Total Protein: 7.4 g/dL (ref 6.5–8.1)

## 2022-07-25 LAB — LACTIC ACID, PLASMA: Lactic Acid, Venous: 2.8 mmol/L (ref 0.5–1.9)

## 2022-07-25 LAB — URINALYSIS, ROUTINE W REFLEX MICROSCOPIC
RBC / HPF: >50 RBC/hpf — ABNORMAL HIGH (ref 0–5)
Specific Gravity, Urine: 1.029 (ref 1.005–1.030)
Squamous Epithelial / HPF: NONE SEEN (ref 0–5)
WBC, UA: >50 WBC/hpf — ABNORMAL HIGH (ref 0–5)

## 2022-07-25 LAB — APTT: aPTT: 33 seconds (ref 24–36)

## 2022-07-25 LAB — PROTIME-INR
INR: 1.3 — ABNORMAL HIGH (ref 0.8–1.2)
Prothrombin Time: 15.8 seconds — ABNORMAL HIGH (ref 11.4–15.2)

## 2022-07-25 LAB — TROPONIN I (HIGH SENSITIVITY): Troponin I (High Sensitivity): 27 ng/L — ABNORMAL HIGH (ref <18)

## 2022-07-25 LAB — PROCALCITONIN: Procalcitonin: 0.17 ng/mL

## 2022-07-25 MED ORDER — LACTATED RINGERS IV BOLUS (SEPSIS)
2000.0000 mL | Freq: Once | INTRAVENOUS | Status: DC
  Administered 2022-07-25: 2000 mL via INTRAVENOUS

## 2022-07-25 MED ORDER — LACTATED RINGERS IV SOLN: INTRAVENOUS | Status: DC

## 2022-07-25 MED ORDER — SODIUM CHLORIDE 0.9 % IV SOLN: INTRAVENOUS | Status: AC

## 2022-07-25 MED ORDER — ONDANSETRON HCL 4 MG/2ML IJ SOLN
4.0000 mg | Freq: Once | INTRAMUSCULAR | Status: AC
  Administered 2022-07-25: 4 mg via INTRAVENOUS
  Filled 2022-07-25: qty 2

## 2022-07-25 MED ORDER — SODIUM CHLORIDE 0.9 % IV BOLUS
1000.0000 mL | Freq: Once | INTRAVENOUS | Status: AC
  Administered 2022-07-25: 1000 mL via INTRAVENOUS

## 2022-07-25 MED ORDER — SODIUM CHLORIDE 0.9 % IV BOLUS
2000.0000 mL | Freq: Once | INTRAVENOUS | Status: AC
  Administered 2022-07-25: 2000 mL via INTRAVENOUS

## 2022-07-25 MED ORDER — LEVOFLOXACIN IN D5W 750 MG/150ML IV SOLN
750.0000 mg | Freq: Once | INTRAVENOUS | Status: AC
  Administered 2022-07-25: 750 mg via INTRAVENOUS
  Filled 2022-07-25: qty 150

## 2022-07-25 NOTE — Sepsis Progress Note (Signed)
Elink following code sepsis °

## 2022-07-25 NOTE — ED Notes (Signed)
Per lab, lactic 2.8.  EDP Malinda notified.

## 2022-07-25 NOTE — Progress Notes (Signed)
CODE SEPSIS - PHARMACY COMMUNICATION  **Broad Spectrum Antibiotics should be administered within 1 hour of Sepsis diagnosis**  Time Code Sepsis Called/Page Received: 2231  Antibiotics Ordered: Levaquin  Time of 1st antibiotic administration: [place], PharmD, [facility] 07/25/2022 10:32 PM

## 2022-07-25 NOTE — ED Provider Notes (Signed)
[facility] Provider Note    Event Date/Time   First MD Initiated Contact with Patient 07/25/22 2016     (approximate)   History   Rectal Bleeding (W/Clotting)   HPI  Ronald Matthews is a 88 y.o. male who is coming from [facility] tachycardic low-grade fever with clots in his diaper.  It was reported to be rectal bleeding but when I do his rectal exam there is only brown stool there is no bleeding he has bloody urine coming from his penis.      Physical Exam   Triage Vital Signs: ED Triage Vitals  Enc Vitals Group     BP 07/25/22 2021 101/62     Pulse Rate 07/25/22 2021 (!) 126     Resp 07/25/22 2021 14     Temp 07/25/22 2021 99.1 F (37.3 C)     Temp Source 07/25/22 2021 Oral     SpO2 07/25/22 2017 97 %     Weight 07/25/22 2023 156 lb 12 oz (71.1 kg)     Height 07/25/22 2023 5\' 10"  (1.778 m)     Head Circumference --      Peak Flow --      Pain Score 07/25/22 2022 0     Pain Loc --      Pain Edu? --      Excl. in [place]? --     Most recent vital signs: Vitals:   07/25/22 2249 07/25/22 2254  BP: 122/72   Pulse: (!) 129   Resp: 16   Temp:  98.9 F (37.2 C)  SpO2: 100%     General: Awake, no distress.  CV:  Good peripheral perfusion.  Heart regular rate and rhythm no audible murmurs tacky Resp:  Normal effort.  Lungs are clear Abd:  No distention.  Soft and nontender Rectal: Brown stool no blood Penis: Bloody urine coming out.  There is blood all over the diaper mostly in the front.  There are some clots there to.   ED Results / Procedures / Treatments   Labs (all labs ordered are listed, but only abnormal results are displayed) Labs Reviewed  COMPREHENSIVE METABOLIC PANEL - Abnormal; Notable for the following components:      Result Value   CO2 21 (*)    Glucose, Bld 188 (*)    BUN 33 (*)    Creatinine, Ser 1.60 (*)    Calcium 8.5 (*)    Albumin 3.1 (*)    GFR, Estimated 41 (*)    All other components within normal  limits  CBC WITH DIFFERENTIAL/PLATELET - Abnormal; Notable for the following components:   RDW 17.1 (*)    Lymphs Abs 0.6 (*)    All other components within normal limits  URINALYSIS, ROUTINE W REFLEX MICROSCOPIC - Abnormal; Notable for the following components:   Color, Urine RED (*)    APPearance CLOUDY (*)    Glucose, UA   (*)    Value: TEST NOT REPORTED DUE TO COLOR INTERFERENCE OF URINE PIGMENT   Hgb urine dipstick   (*)    Value: TEST NOT REPORTED DUE TO COLOR INTERFERENCE OF URINE PIGMENT   Bilirubin Urine   (*)    Value: TEST NOT REPORTED DUE TO COLOR INTERFERENCE OF URINE PIGMENT   Ketones, ur   (*)    Value: TEST NOT REPORTED DUE TO COLOR INTERFERENCE OF URINE PIGMENT   Protein, ur   (*)    Value: TEST NOT REPORTED DUE  TO COLOR INTERFERENCE OF URINE PIGMENT   Nitrite   (*)    Value: TEST NOT REPORTED DUE TO COLOR INTERFERENCE OF URINE PIGMENT   Leukocytes,Ua   (*)    Value: TEST NOT REPORTED DUE TO COLOR INTERFERENCE OF URINE PIGMENT   RBC / HPF >50 (*)    WBC, UA >50 (*)    Bacteria, UA MANY (*)    All other components within normal limits  PROTIME-INR - Abnormal; Notable for the following components:   Prothrombin Time 15.8 (*)    INR 1.3 (*)    All other components within normal limits  LACTIC ACID, PLASMA - Abnormal; Notable for the following components:   Lactic Acid, Venous 2.8 (*)    All other components within normal limits  CULTURE, BLOOD (SINGLE)  APTT  PROCALCITONIN  PROCALCITONIN  LACTIC ACID, PLASMA  TYPE AND SCREEN  TROPONIN I (HIGH SENSITIVITY)     EKG     RADIOLOGY    PROCEDURES:  Critical Care performed:   Procedures   MEDICATIONS ORDERED IN ED: Medications  lactated ringers infusion (has no administration in time range)  lactated ringers bolus 2,000 mL (has no administration in time range)  levofloxacin (LEVAQUIN) IVPB 750 mg (has no administration in time range)  ondansetron (ZOFRAN) injection 4 mg (has no administration in  time range)  sodium chloride 0.9 % bolus 1,000 mL (1,000 mLs Intravenous New Bag/Given 07/25/22 2133)     IMPRESSION / MDM / ASSESSMENT AND PLAN / ED COURSE  I reviewed the triage vital signs and the nursing notes. Patient with hematuria tachycardia low-grade fever with a white count and elevated lactic acid meet sepsis criteria probably due to UTI.  He is also vomiting which could be due to [place]. Differential diagnosis includes, but is not limited to, without abdominal pain I doubt he has any thing bad going on there.  His stool was brown the only thing that appears to be bleeding is his urine.  His lactic acid is elevated his procalcitonin is elevated his white count is not elevated but he has a high amount of polys in his differential.  Seems as though he has likely sepsis with urinary source. Patient's presentation is most consistent with acute presentation with potential threat to life or bodily function.  The patient is on the cardiac monitor to evaluate for evidence of arrhythmia and/or significant heart rate changes.  Only tachycardia seen.      FINAL CLINICAL IMPRESSION(S) / ED DIAGNOSES   Final diagnoses:  Sepsis, due to unspecified organism, unspecified whether acute organ dysfunction present [facility])  Urinary tract infection with hematuria, site unspecified     Rx / DC Orders   ED Discharge Orders     None        Note:  This document was prepared using Dragon voice recognition software and may include unintentional dictation errors.   Nena Polio, MD 07/25/22 2255

## 2022-07-25 NOTE — ED Notes (Signed)
Pt provided with pericare, clean disposable brief and linens by this RN and Careers adviser. Blood and clots found around penis area, non rectally with cleaning.

## 2022-07-25 NOTE — ED Notes (Signed)
Pt provided peri care, clean chux, disposable brief by this RN d/t large BM. No blood visualized in stool.

## 2022-07-25 NOTE — ED Notes (Signed)
This RN verbally requested lab to pull culture and 2nd lactic.

## 2022-07-25 NOTE — ED Triage Notes (Signed)
Pt arrives from [facility] via [place].  Reports bleeding from rectum with "substantial" clots w/HX of the same.  Pt BP 116/61, 97% RA, HR 138.  NAD.

## 2022-07-25 NOTE — H&P (Signed)
History and Physical    Terique Kawabata [id number] DOB: 01/23/1933 DOA: 07/25/2022  PCP: Galvin Proffer, MD  Patient coming from: [place] house Chief Complaint: Blood in adult diaper   HPI: Ronald Matthews is a 88 y.o. male with medical history significant of stroke, dementia, h/o NSTEMI in July, CAD, HTN, glaucoma who presents for blood clots, blood found in adult diaper, initially this was thought to be GI bleeding, however, when checked stool is brown.  Patient has suprapubic pain and frank blood from his penis.  He reports no trauma at his ALF.  He has not had this before.  I called his sister who reports no FH of prostate or bladder cancer that she is aware of.  Patient also with nausea and emesis, tachycardia and pain in the abdomen.  He denies chest pain, fever or other symptoms.  He reports current tobacco use, but that he "can't get many" right now.   ED Course: In the ED, he was found to have a Cr of 1.6, which is improved from last admission when it was around 4.  He has a low albumin.  Lactate is 2.8.  PCT is 0.17.  WBC is 6.6.  H/H are stable at 13 and 42.  INR is 1.3.  UA showed frank blood, many bacteria, many WBC.  Male purewick in place with frank blood.  Review of Systems: As per HPI otherwise all other systems reviewed and are negative.   Past Medical History:  Diagnosis Date   Coronary artery disease    Dementia (HCC)    Glaucoma    Hypertension    Thrombocytopenia (HCC)     Past Surgical History:  Procedure Laterality Date   OTHER SURGICAL HISTORY     UNKNOWN    Social History  reports that he has been smoking. He has never used smokeless tobacco. He reports that he does not drink alcohol and does not use drugs.  Allergies  Allergen Reactions   Cephalexin Other (See Comments)   Ppd [Tuberculin Purified Protein Derivative] Other (See Comments)    "Allergic," per MAR   Tuberculin Tests Other (See Comments)    "Allergic," per [facility]    Family History   Problem Relation Age of Onset   Prostate cancer Neg Hx    Bladder Cancer Neg Hx     Prior to Admission medications   Medication Sig Start Date End Date Taking? Authorizing Provider  acetaminophen (TYLENOL) 325 MG tablet Take 2 tablets (650 mg total) by mouth every 4 (four) hours as needed for mild pain (or temp > 37.5 C (99.5 F)). Patient taking differently: Take 650 mg by mouth 3 (three) times daily as needed. Take 650 mg by mouth three times a day and an additional 650 mg as needed for fever, headache, or discomfort 07/18/18   Elgergawy, Leana Roe, MD  alum & mag hydroxide-simeth (MYLANTA) 200-200-20 MG/5ML suspension Take 30 mLs by mouth once as needed for indigestion or heartburn.    [provider]  Amino Acids-Protein Hydrolys (FEEDING SUPPLEMENT, PRO-STAT SUGAR FREE 64,) LIQD Take 30 mLs by mouth daily. CHERRY    [provider]  aspirin 81 MG chewable tablet Chew 81 mg by mouth in the morning.    [provider]  baclofen (LIORESAL) 10 MG tablet Take 10 mg by mouth 3 (three) times daily.    [provider]  bismuth subsalicylate (PEPTO-BISMOL) 262 MG/15ML suspension Take 15 mLs by mouth every 2 (two) hours as needed for indigestion  or diarrhea or loose stools (max of 6 doses in 24 hours).    [provider]  brimonidine (ALPHAGAN) 0.2 % ophthalmic solution Place 1 drop into both eyes 2 (two) times daily.    [provider]  brinzolamide (AZOPT) 1 % ophthalmic suspension Place 1 drop into both eyes in the morning and at bedtime.    [provider]  Colloidal Oatmeal (AVEENO ECZEMA THERAPY) 1 % CREA Apply 1 application topically See admin instructions. Apply to affected areas 2 times a day    [provider]  hydrocortisone cream 1 % Apply 1 application topically 2 (two) times daily.    [provider]  lactose free nutrition (BOOST) LIQD Take 237 mLs by mouth 3 (three) times daily. VANILLA    [provider]  levETIRAcetam (KEPPRA) 500 MG tablet Take 1 tablet (500 mg total) by mouth 2 (two) times daily. 06/25/20   Meredeth Ide, MD  loperamide (IMODIUM A-D) 2 MG tablet Take 2 mg by mouth as needed (after each loose stool- up to 4 doses in 12 hours and call MD if this diarrhea persists longer than 12 hours or is accompanied by severe abdominal pain).    [provider]  Melatonin 10 MG TABS Take 10 mg by mouth at bedtime as needed.    [provider]  metoprolol succinate (TOPROL-XL) 25 MG 24 hr tablet Take 12.5 mg by mouth daily.     [provider]  Multiple Vitamins-Minerals (MULTIVITAMINS THER. W/MINERALS) TABS tablet Take 1 tablet by mouth daily.    [provider]  nitroGLYCERIN (NITROSTAT) 0.4 MG SL tablet Place 0.4 mg under the tongue every 5 (five) minutes x 3 doses as needed for chest pain (AND CALL EMS IF NO RESOLUTION).    [provider]  ondansetron (ZOFRAN-ODT) 4 MG disintegrating tablet Take 1 tablet (4 mg total) by mouth every 6 (six) hours as needed for nausea. 05/26/22   Arnetha Courser, MD  senna-docusate (SENOKOT-S) 8.6-50 MG tablet Take 1 tablet by mouth at bedtime as needed for mild constipation. 04/19/19   Gouru, Deanna Artis, MD  Travoprost, BAK Free, (TRAVATAN) 0.004 % SOLN ophthalmic solution Place 1 drop into both eyes at bedtime.    [provider]  traZODone (DESYREL) 50 MG tablet Take 50 mg by mouth at bedtime.    [provider]  zinc oxide 20 % ointment Apply 1 application  topically See admin instructions. Apply topically to sacrum twice daily    [provider]    Physical Exam: Vitals:   07/25/22 2127 07/25/22 2218 07/25/22 2249 07/25/22 2254  BP: 106/75 128/69 122/72   Pulse: (!) 120 (!) 129 (!) 129   Resp: 16 19 16    Temp:    98.9 F (37.2 C)  TempSrc:    Oral  SpO2: 100% 100% 100%   Weight:      Height:        Constitutional: Lying in bed, uncomfortable due to nausea, emesis while  in room Eyes: He has drainage from the left eye ENMT: Mucous membranes are dry Neck: normal, supple Respiratory: CTAB, no wheezing or rales Cardiovascular: Tachycardic, regular, no murmur (though very fast currently), no LE edema Abdomen: + TTP with guarding in the suprapubic area, +BS, no distention Musculoskeletal: no clubbing / cyanosis. Low bulk, but normal tone GU:  Stool is brown.  Frank blood present from urethra and in male purewick collection Skin: no rashes, lesions, ulcers on exposed skin Neurologic:  Grossly intact, moving in bed, following commands.  Psychiatric: Alert, able to answer questions, speech is intelligible.    Labs on Admission: I have personally reviewed following labs and imaging studies  CBC: Recent Labs  Lab 07/25/22 2115  WBC 6.6  NEUTROABS 5.5  HGB 13.1  HCT 42.7  MCV 86.3  PLT 204    Basic Metabolic Panel: Recent Labs  Lab 07/25/22 2115  NA 138  K 4.7  CL 109  CO2 21*  GLUCOSE 188*  BUN 33*  CREATININE 1.60*  CALCIUM 8.5*    GFR: Estimated Creatinine Clearance: 32.7 mL/min (A) (by C-G formula based on SCr of 1.6 mg/dL (H)).  Liver Function Tests: Recent Labs  Lab 07/25/22 2115  AST 22  ALT 11  ALKPHOS 75  BILITOT 0.5  PROT 7.4  ALBUMIN 3.1*    Urine analysis:    Component Value Date/Time   COLORURINE RED (A) 07/25/2022 2115   APPEARANCEUR CLOUDY (A) 07/25/2022 2115   LABSPEC 1.029 07/25/2022 2115   PHURINE  07/25/2022 2115    TEST NOT REPORTED DUE TO COLOR INTERFERENCE OF URINE PIGMENT   GLUCOSEU (A) 07/25/2022 2115    TEST NOT REPORTED DUE TO COLOR INTERFERENCE OF URINE PIGMENT   HGBUR (A) 07/25/2022 2115    TEST NOT REPORTED DUE TO COLOR INTERFERENCE OF URINE PIGMENT   BILIRUBINUR (A) 07/25/2022 2115    TEST NOT REPORTED DUE TO COLOR INTERFERENCE OF URINE PIGMENT   KETONESUR (A) 07/25/2022 2115    TEST NOT REPORTED DUE TO COLOR INTERFERENCE OF URINE PIGMENT   PROTEINUR (A) 07/25/2022 2115    TEST NOT REPORTED  DUE TO COLOR INTERFERENCE OF URINE PIGMENT   NITRITE (A) 07/25/2022 2115    TEST NOT REPORTED DUE TO COLOR INTERFERENCE OF URINE PIGMENT   LEUKOCYTESUR (A) 07/25/2022 2115    TEST NOT REPORTED DUE TO COLOR INTERFERENCE OF URINE PIGMENT    Radiological Exams on Admission: No results found.  EKG: Independently reviewed. ***  Assessment/Plan Principal Problem:   Gross hematuria  (please populate well all problems here in Problem List. (For example, if patient is on BP meds at home and you resume or decide to hold them, it is a problem that needs to be her. Same for CAD, COPD, HLD and so on)   ***  DVT prophylaxis: *** (Lovenox/Heparin/SCD's/anticoagulated/None (if comfort care) Code Status:   *** (Full/Partial (specify details) Family Communication:  *** (Specify name, relationship. Do not write "discussed with patient". Specify tel # if discussed over the phone) Disposition Plan:   Patient is from:  ***  Anticipated DC to:  ***  Anticipated DC date:  ***  Anticipated DC barriers: ***  Consults called:  *** (with names) Admission status:  *** (inpatient / obs / tele / medical floor / SDU)  Severity of Illness: {Observation/Inpatient:21159}    Debe Coder MD [facility]  How to contact the [facility] Attending or Consulting provider 7A - 7P or covering provider during after hours 7P -7A, for this patient?   Check the care team in [facility] and look for a) attending/consulting TRH provider listed and b) the [facility] team listed Log into [email or web address] and use [place]'s universal password to access. If you do not have the password, please contact the hospital operator. Locate the [facility] provider you are looking for under [facility] and page to a number that you can be directly reached. If you still have difficulty reaching the provider, please page the [facility] (Director on Call)  for the Hospitalists listed on amion for assistance.  07/25/2022, 11:21 PM

## 2022-07-26 ENCOUNTER — Inpatient Hospital Stay: Payer: Medicare Other

## 2022-07-26 DIAGNOSIS — R6520 Severe sepsis without septic shock: Secondary | ICD-10-CM

## 2022-07-26 DIAGNOSIS — D62 Acute posthemorrhagic anemia: Secondary | ICD-10-CM | POA: Diagnosis not present

## 2022-07-26 DIAGNOSIS — N1831 Chronic kidney disease, stage 3a: Secondary | ICD-10-CM

## 2022-07-26 DIAGNOSIS — K5641 Fecal impaction: Secondary | ICD-10-CM | POA: Diagnosis present

## 2022-07-26 DIAGNOSIS — H409 Unspecified glaucoma: Secondary | ICD-10-CM | POA: Diagnosis present

## 2022-07-26 DIAGNOSIS — N179 Acute kidney failure, unspecified: Secondary | ICD-10-CM | POA: Diagnosis present

## 2022-07-26 DIAGNOSIS — N3001 Acute cystitis with hematuria: Secondary | ICD-10-CM

## 2022-07-26 DIAGNOSIS — I471 Supraventricular tachycardia: Secondary | ICD-10-CM | POA: Clinically undetermined

## 2022-07-26 DIAGNOSIS — A419 Sepsis, unspecified organism: Secondary | ICD-10-CM

## 2022-07-26 DIAGNOSIS — Z8679 Personal history of other diseases of the circulatory system: Secondary | ICD-10-CM

## 2022-07-26 LAB — CBC WITH DIFFERENTIAL/PLATELET
Abs Immature Granulocytes: 0.04 10*3/uL (ref 0.00–0.07)
Basophils Absolute: 0 10*3/uL (ref 0.0–0.1)
Basophils Relative: 0 %
Eosinophils Absolute: 0 10*3/uL (ref 0.0–0.5)
Eosinophils Relative: 0 %
HCT: 18.9 % — ABNORMAL LOW (ref 39.0–52.0)
Hemoglobin: 5.8 g/dL — ABNORMAL LOW (ref 13.0–17.0)
Immature Granulocytes: 0 %
Lymphocytes Relative: 12 %
Lymphs Abs: 1.4 10*3/uL (ref 0.7–4.0)
MCH: 26.9 pg (ref 26.0–34.0)
MCHC: 30.7 g/dL (ref 30.0–36.0)
MCV: 87.5 fL (ref 80.0–100.0)
Monocytes Absolute: 0.9 10*3/uL (ref 0.1–1.0)
Monocytes Relative: 7 %
Neutro Abs: 9.7 10*3/uL — ABNORMAL HIGH (ref 1.7–7.7)
Neutrophils Relative %: 81 %
Platelets: 241 10*3/uL (ref 150–400)
RBC: 2.16 MIL/uL — ABNORMAL LOW (ref 4.22–5.81)
RDW: 17 % — ABNORMAL HIGH (ref 11.5–15.5)
WBC: 12.1 10*3/uL — ABNORMAL HIGH (ref 4.0–10.5)
nRBC: 0 % (ref 0.0–0.2)

## 2022-07-26 LAB — CBC
HCT: 19 % — ABNORMAL LOW (ref 39.0–52.0)
Hemoglobin: 5.8 g/dL — ABNORMAL LOW (ref 13.0–17.0)
MCH: 27.1 pg (ref 26.0–34.0)
MCHC: 30.5 g/dL (ref 30.0–36.0)
MCV: 88.8 fL (ref 80.0–100.0)
Platelets: 237 10*3/uL (ref 150–400)
RBC: 2.14 MIL/uL — ABNORMAL LOW (ref 4.22–5.81)
RDW: 17.2 % — ABNORMAL HIGH (ref 11.5–15.5)
WBC: 14.3 10*3/uL — ABNORMAL HIGH (ref 4.0–10.5)
nRBC: 0 % (ref 0.0–0.2)

## 2022-07-26 LAB — TECHNOLOGIST SMEAR REVIEW: Plt Morphology: NORMAL

## 2022-07-26 LAB — URINALYSIS, COMPLETE (UACMP) WITH MICROSCOPIC: Specific Gravity, Urine: 1.03 (ref 1.005–1.030)

## 2022-07-26 LAB — BASIC METABOLIC PANEL
Anion gap: 6 (ref 5–15)
BUN: 30 mg/dL — ABNORMAL HIGH (ref 8–23)
CO2: 18 mmol/L — ABNORMAL LOW (ref 22–32)
Calcium: 7.3 mg/dL — ABNORMAL LOW (ref 8.9–10.3)
Chloride: 116 mmol/L — ABNORMAL HIGH (ref 98–111)
Creatinine, Ser: 1.45 mg/dL — ABNORMAL HIGH (ref 0.61–1.24)
GFR, Estimated: 47 mL/min — ABNORMAL LOW (ref >60)
Glucose, Bld: 130 mg/dL — ABNORMAL HIGH (ref 70–99)
Potassium: 5 mmol/L (ref 3.5–5.1)
Sodium: 140 mmol/L (ref 135–145)

## 2022-07-26 LAB — MRSA NEXT GEN BY PCR, NASAL: MRSA by PCR Next Gen: DETECTED — AB

## 2022-07-26 LAB — FIBRINOGEN: Fibrinogen: 454 mg/dL (ref 210–475)

## 2022-07-26 LAB — LACTIC ACID, PLASMA
Lactic Acid, Venous: 2 mmol/L (ref 0.5–1.9)
Lactic Acid, Venous: 2.3 mmol/L (ref 0.5–1.9)
Lactic Acid, Venous: 2.4 mmol/L (ref 0.5–1.9)
Lactic Acid, Venous: 2.7 mmol/L (ref 0.5–1.9)
Lactic Acid, Venous: 2.8 mmol/L (ref 0.5–1.9)
Lactic Acid, Venous: 3.4 mmol/L (ref 0.5–1.9)

## 2022-07-26 LAB — APTT: aPTT: 39 seconds — ABNORMAL HIGH (ref 24–36)

## 2022-07-26 LAB — HEMOGLOBIN AND HEMATOCRIT, BLOOD
HCT: 21.7 % — ABNORMAL LOW (ref 39.0–52.0)
Hemoglobin: 7 g/dL — ABNORMAL LOW (ref 13.0–17.0)

## 2022-07-26 LAB — TROPONIN I (HIGH SENSITIVITY)
Troponin I (High Sensitivity): 48 ng/L — ABNORMAL HIGH (ref <18)
Troponin I (High Sensitivity): 1652 ng/L (ref <18)
Troponin I (High Sensitivity): 2680 ng/L (ref <18)

## 2022-07-26 LAB — PREPARE RBC (CROSSMATCH)

## 2022-07-26 LAB — PROCALCITONIN: Procalcitonin: 0.38 ng/mL

## 2022-07-26 LAB — GLUCOSE, CAPILLARY: Glucose-Capillary: 110 mg/dL — ABNORMAL HIGH (ref 70–99)

## 2022-07-26 LAB — PROTIME-INR
INR: 1.5 — ABNORMAL HIGH (ref 0.8–1.2)
Prothrombin Time: 17.7 seconds — ABNORMAL HIGH (ref 11.4–15.2)

## 2022-07-26 LAB — LACTATE DEHYDROGENASE: LDH: 138 U/L (ref 98–192)

## 2022-07-26 LAB — DAT, POLYSPECIFIC AHG (ARMC ONLY): Polyspecific AHG test: NEGATIVE

## 2022-07-26 MED ORDER — MORPHINE SULFATE (PF) 2 MG/ML IV SOLN: 2.0000 mg | INTRAVENOUS | Status: DC | PRN

## 2022-07-26 MED ORDER — ORAL CARE MOUTH RINSE
15.0000 mL | OROMUCOSAL | Status: DC
  Administered 2022-07-26 – 2022-08-03 (×26): 15 mL via OROMUCOSAL

## 2022-07-26 MED ORDER — BRINZOLAMIDE 1 % OP SUSP
1.0000 [drp] | Freq: Three times a day (TID) | OPHTHALMIC | Status: DC
  Administered 2022-07-26 – 2022-08-03 (×25): 1 [drp] via OPHTHALMIC
  Filled 2022-07-26: qty 10

## 2022-07-26 MED ORDER — ONDANSETRON 4 MG PO TBDP: 4.0000 mg | ORAL_TABLET | Freq: Four times a day (QID) | ORAL | Status: DC | PRN

## 2022-07-26 MED ORDER — TRAZODONE HCL 50 MG PO TABS
50.0000 mg | ORAL_TABLET | Freq: Every day | ORAL | Status: DC
  Administered 2022-07-26 – 2022-08-02 (×8): 50 mg via ORAL
  Filled 2022-07-26 (×8): qty 1

## 2022-07-26 MED ORDER — PIPERACILLIN-TAZOBACTAM 3.375 G IVPB
3.3750 g | Freq: Three times a day (TID) | INTRAVENOUS | Status: DC
  Administered 2022-07-26 – 2022-07-31 (×15): 3.375 g via INTRAVENOUS
  Filled 2022-07-26 (×15): qty 50

## 2022-07-26 MED ORDER — OXYCODONE HCL 5 MG PO TABS
5.0000 mg | ORAL_TABLET | ORAL | Status: DC | PRN
  Administered 2022-07-31: 5 mg via ORAL
  Filled 2022-07-26: qty 1

## 2022-07-26 MED ORDER — NITROGLYCERIN 0.4 MG SL SUBL: 0.4000 mg | SUBLINGUAL_TABLET | SUBLINGUAL | Status: DC | PRN

## 2022-07-26 MED ORDER — BISACODYL 10 MG RE SUPP: 10.0000 mg | Freq: Every day | RECTAL | Status: DC | PRN

## 2022-07-26 MED ORDER — IOHEXOL 300 MG/ML  SOLN
100.0000 mL | Freq: Once | INTRAMUSCULAR | Status: AC | PRN
  Administered 2022-07-26: 100 mL via INTRAVENOUS

## 2022-07-26 MED ORDER — MUPIROCIN 2 % EX OINT
1.0000 | TOPICAL_OINTMENT | Freq: Two times a day (BID) | CUTANEOUS | Status: AC
  Administered 2022-07-26 – 2022-07-31 (×10): 1 via NASAL
  Filled 2022-07-26: qty 22

## 2022-07-26 MED ORDER — CHLORHEXIDINE GLUCONATE CLOTH 2 % EX PADS
6.0000 | MEDICATED_PAD | Freq: Every day | CUTANEOUS | Status: DC
  Administered 2022-07-26: 6 via TOPICAL

## 2022-07-26 MED ORDER — BACLOFEN 10 MG PO TABS
10.0000 mg | ORAL_TABLET | Freq: Three times a day (TID) | ORAL | Status: DC
  Administered 2022-07-26 – 2022-08-03 (×25): 10 mg via ORAL
  Filled 2022-07-26 (×28): qty 1

## 2022-07-26 MED ORDER — LATANOPROST 0.005 % OP SOLN
1.0000 [drp] | Freq: Every day | OPHTHALMIC | Status: DC
  Administered 2022-07-26 – 2022-08-02 (×6): 1 [drp] via OPHTHALMIC
  Filled 2022-07-26 (×2): qty 2.5

## 2022-07-26 MED ORDER — ORAL CARE MOUTH RINSE: 15.0000 mL | OROMUCOSAL | Status: DC | PRN

## 2022-07-26 MED ORDER — SODIUM CHLORIDE 0.9% IV SOLUTION
Freq: Once | INTRAVENOUS | Status: AC
  Filled 2022-07-26: qty 250

## 2022-07-26 MED ORDER — SODIUM CHLORIDE 0.9 % IV SOLN
2.0000 g | INTRAVENOUS | Status: DC
  Administered 2022-07-26: 2 g via INTRAVENOUS
  Filled 2022-07-26: qty 20

## 2022-07-26 MED ORDER — ACETAMINOPHEN 650 MG RE SUPP: 650.0000 mg | Freq: Once | RECTAL | Status: DC

## 2022-07-26 MED ORDER — SENNOSIDES-DOCUSATE SODIUM 8.6-50 MG PO TABS
1.0000 | ORAL_TABLET | Freq: Two times a day (BID) | ORAL | Status: DC
  Administered 2022-07-26 – 2022-08-03 (×12): 1 via ORAL
  Filled 2022-07-26 (×14): qty 1

## 2022-07-26 MED ORDER — ACETAMINOPHEN 325 MG PO TABS
650.0000 mg | ORAL_TABLET | Freq: Four times a day (QID) | ORAL | Status: DC | PRN
  Administered 2022-07-31: 650 mg via ORAL
  Filled 2022-07-26: qty 2

## 2022-07-26 MED ORDER — METOPROLOL SUCCINATE ER 25 MG PO TB24
12.5000 mg | ORAL_TABLET | Freq: Every day | ORAL | Status: DC
  Administered 2022-07-28 – 2022-08-03 (×6): 12.5 mg via ORAL
  Filled 2022-07-26 (×2): qty 1
  Filled 2022-07-26: qty 0.5
  Filled 2022-07-26 (×5): qty 1

## 2022-07-26 MED ORDER — CHLORHEXIDINE GLUCONATE CLOTH 2 % EX PADS
6.0000 | MEDICATED_PAD | Freq: Every day | CUTANEOUS | Status: AC
  Administered 2022-07-27 – 2022-07-31 (×5): 6 via TOPICAL

## 2022-07-26 MED ORDER — BRIMONIDINE TARTRATE 0.2 % OP SOLN
1.0000 [drp] | Freq: Two times a day (BID) | OPHTHALMIC | Status: DC
  Administered 2022-07-26 – 2022-08-03 (×16): 1 [drp] via OPHTHALMIC
  Filled 2022-07-26: qty 5

## 2022-07-26 MED ORDER — LEVETIRACETAM 500 MG PO TABS
500.0000 mg | ORAL_TABLET | Freq: Two times a day (BID) | ORAL | Status: DC
  Administered 2022-07-26 – 2022-08-03 (×17): 500 mg via ORAL
  Filled 2022-07-26 (×17): qty 1

## 2022-07-26 MED ORDER — MELATONIN 5 MG PO TABS: 10.0000 mg | ORAL_TABLET | Freq: Every evening | ORAL | Status: DC | PRN

## 2022-07-26 MED ORDER — ACETAMINOPHEN 325 MG PO TABS: 650.0000 mg | ORAL_TABLET | ORAL | Status: DC | PRN

## 2022-07-26 NOTE — Assessment & Plan Note (Signed)
No acute issues. --Continue Keppra

## 2022-07-26 NOTE — Progress Notes (Addendum)
Progress Note   Patient: Ronald Matthews [id number] DOB: 08/08/1933 DOA: 07/25/2022     1 DOS: the patient was seen and examined on 07/26/2022   Brief hospital course: HPI on Admission:  "Ronald Matthews is a 88 y.o. male with medical history significant of stroke, dementia, h/o NSTEMI in July, CAD, HTN, glaucoma who presents for blood clots, blood found in adult diaper, initially this was thought to be GI bleeding, however, when checked stool is brown.  Patient has suprapubic pain and frank blood from his penis.  He reports no trauma at his ALF.  He has not had this before.  I called his sister who reports no FH of prostate or bladder cancer that she is aware of.  Patient also with nausea and emesis, tachycardia and pain in the abdomen.  ...   ED Course: In the ED, he was found to have a Cr of 1.6, Low albumin.  Lactate 2.8.  PCT is 0.17.  WBC is 6.6.  H/H are stable at 13 and 42.  INR is 1.3.  UA showed frank blood, many bacteria, many WBC.  Male purewick in place with frank blood."  Pt was admitted to the hospital, stepdown unit, for further evaluation and management.    9/24: Repeat CBC morning following admission had dropped to 5.8.  Suspect Hbg 13 was in part hemoconcentration, but with ongoing gross hematuria, represents significant blood loss. Two units packed RBC's were ordered for transfusion. Blood pressures soft but with stable MAP's.  Tachycardic with HR 120-130 on monitor in the ED during rounds.   I called patient's sister (HPOA) who stated she wants blood transfusions and evaluation to determine cause of the bleeding.  Despite patient being on hospice at ALF, she reports "he's been doing well", and declines to transition to comfort care measures at this time.      Assessment and Plan: * Acute cystitis with hematuria Urology following.  CT hematuria workup reviewed, showing some clot in the bladder and quite a bit of air in bladder and kidneys, likely from infection. No obvious  mass or other finding that would require surgical intervention.  --Continue IV antibiotics, change to Zosyn based on prior culture data --Try to avoid Foley placement --Bladder scans to monitor for retention  ABLA (acute blood loss anemia) Due to gross hematuria.  Hbg initially 13 (suspect falsely high due to hemoconcentration) given recent baseline Hbg was 9 in July. Hbg dropped to 5.8 on morning following admission. --Transfuse 2 units pRBC's (consent obtained from sister/HPOA) --Trend Hbg & transfuse if < 7.0 --Otherwise mgmt as outlined  Note: Patient had an increase in temperature but did not spike fever shortly after first unit blood and started running.  Transfusion was stopped, blood bank was notified and evaluation for hemolysis were ordered.  Blood bank evaluation negative for hemolytic reaction.  Chest xray no sign of TRALI. --Hematology consulted for input   Severe sepsis [facility]) Due to UTI.  Meets sepsis criteria admission with leukocytosis, tachycardia, UA with infection.  Lactic acidosis consistent with organ dysfunction and severe sepsis. Prior admission was septic shock, in July. --Continue IV antibiotics --Follow cultures --Monitor CBC, fever curve and hemodynamics closely  NSTEMI (non-ST elevated myocardial infarction) ([place]) Type 2 NSTEMI due to demand ischemia in the setting of acute blood loss anemia and gross hematuria.  Pt has no chest pain and EKG non-acute.  Pt had NSTEMI prior admission in July with troponin peak > 9k.  Medical management was recommended after discussion  with cardiology and patient's family.  He is not a candidate for invasive intervention.  Troponin trend: 48 >> 1652 >> 2680. --Telemetry --Stat EKG if chest pain --Trend troponin until peak   Lactic acidosis Due to combination of hypovolemia with bleeding, and infection/UTI. --Trend lactic acid --Getting blood transfusions --IV fluids if needed post-transfusions  CAD (coronary artery  disease) Stable.  Prior hospital admission with NSTEMI. Hold aspirin with bleeding and acute anemia.  Gross hematuria Urology consulted. Likely hemorrhagic cystitis. Mgmt as outlined.  Fecal impaction ([place]) CT done for hematuria work up showed rectum distended with stool. --daily Dulcolax suppository in no BM in 24 hrs --Senna-S BID until having regular BM's  Dementia ([place]) No behavioral issues noted. --Delirium precautions  Glaucoma Continue eye drop regimen.  Seizure ([place]) No acute issues. --Continue Keppra  SVT (supraventricular tachycardia) (HCC) Prior hx of SVT.  Currently tachycardic and hypotensive due to acute blood loss with gross hematuria. --Continue metoprolol. --Monitor on telemetry. --Replace electrolytes as needed.  Chronic kidney disease, stage 3a ([place]) Baseline Cr isn't clear, but pt likely has CKD-3a.  Cr on admission was 1.60, down from 4.01 in July (when he had septic shock).  Prior baseline was 2 years ago (Cr 0.90). --Monitor BMP --Renally dose meds and avoid nephrotoxins        Subjective: Patient was seen this morning in the ED, holding for bed.  He denied having any pain or discomfort.  Does report having bladder discomfort at times.  Says he overall feels okay.  Called his sister/H POA after seeing the patient.  She gave verbal consent for blood transfusions and declines proceeding with comfort care at this time despite patient being followed by hospice at ALF.    Physical Exam: Vitals:   07/26/22 1115 07/26/22 1130 07/26/22 1305 07/26/22 1310  BP:  106/65 (!) 150/124 (!) 121/46  Pulse: (!) 110   82  Resp:  19 19 (!) 24  Temp:    98.7 F (37.1 C)  TempSrc:    Axillary  SpO2:  99%  99%  Weight:      Height:       General exam: awake, alert, no acute distress HEENT: moist mucus membranes, hearing grossly normal  Respiratory system: CTAB, no wheezes, rales or rhonchi, normal respiratory effort. Cardiovascular system: normal S1/S2,  tachycardic, no peripheral edema.   Gastrointestinal system: soft, NT, ND Genitourinary system: gross hematuria in suction canister Central nervous system: no gross focal neurologic deficits, normal speech Skin: dry, intact, normal temperature Psychiatry: dementia, normal mood, congruent affect, abnormal judgement and insight, calm demeanor    Data Reviewed:  Notable labs ---- troponin increased 1652 >> 2680.  Lactate 2.7 from 2.8, 2.0, 2.4.  WBC improved 12.1 from 14.3k.  Cr 1.45 from 1.60.  Bicarb 18 with gap 6.  Glucose 130, chloride 116, Ca 7.3,   Additional labs and CXR pending for evaluation of possible transfusion reaction.    Family Communication: spoke with HPOA (sister) this AM by phone, updated on severe anemia, guarded prognosis.  She declined comfort measures but confirms he is followed by hospice at ALF.  She requested we transfuse and attempt to determine cause of his bleeding.   Disposition: Status is: Inpatient Remains inpatient appropriate because: critically ill with active bleeding and anemia requiring transfusions, on IV antibiotics   Planned Discharge Destination:  Return to ALF with hospice    Time spent: 65 minutes including time spent at bedside and in coordination of care  Author:  Ezekiel Slocumb, DO 07/26/2022 2:40 PM  For on call review [email or web address].

## 2022-07-26 NOTE — Assessment & Plan Note (Signed)
CT done for hematuria work up showed rectum distended with stool. --daily Dulcolax suppository in no BM in 24 hrs --Senna-S BID until having regular BM's

## 2022-07-26 NOTE — Assessment & Plan Note (Addendum)
Due to gross hematuria.  Hbg initially 13 (suspect falsely high due to hemoconcentration) given recent baseline Hbg was 9 in July. Hbg dropped to 5.8 on morning following admission.  Received total 3 units PRBC Plan: --monitor Hgb and transfuse to keep Hgb >7

## 2022-07-26 NOTE — Assessment & Plan Note (Addendum)
Urology consulted. Likely hemorrhagic cystitis.  Hematuria resolved with treatment of UTI

## 2022-07-26 NOTE — Assessment & Plan Note (Signed)
Continue eye drop regimen.

## 2022-07-26 NOTE — ED Notes (Signed)
Notified IP MD Mansy and Griffith of pt Hgb of 5.8 per lab.

## 2022-07-26 NOTE — Assessment & Plan Note (Addendum)
Troponin trend: 48 >> 1652 >> 2680...[id number]. Pt has no chest pain and EKG non-acute.   Pt also had NSTEMI prior admission in July with troponin peak > 9k.  Medical management was recommended after discussion with cardiology and patient's family.  He is not a candidate for invasive intervention.  He is followed by hospice. --Heparin contraindicated with active bleeding and anemia requiring transfusions Plan: --Stat EKG if chest pain

## 2022-07-26 NOTE — TOC Initial Note (Signed)
Transition of Care [facility]) - Initial/Assessment Note    Patient Details  Name: Ronald Matthews MRN: [id number] Date of Birth: 1933/01/04  Transition of [facility]) CM/SW Contact:    Magnus Ivan, LCSW Phone Number: 07/26/2022, 2:27 PM  Clinical Narrative:                 Patient admitted to ICU from [facility]. Called Anderson Malta - Scientist, physiological at [facility] who confirmed patient is a resident there, stated TOC will need to call main # if any needs over the weekend.  Called Melissa with [facility] who confirms they follow patient for hospice services at ALF. TOC will continue to follow. ALF will likely need to assess patient prior to DC to ensure they can continue to meet his needs at [facility].   Expected Discharge Plan: Assisted Living Barriers to Discharge: Continued Medical Work up   Patient Goals and CMS Choice        Expected Discharge Plan and Services Expected Discharge Plan: Assisted Living       Living arrangements for the past 2 months: Assisted Living Facility                                      Prior Living Arrangements/Services Living arrangements for the past 2 months: [place] Lives with:: Facility Resident              Current home services: Hospice    Activities of Daily Living Home Assistive Devices/Equipment: Wheelchair ADL Screening (condition at time of admission) Patient's cognitive ability adequate to safely complete daily activities?: No Is the patient deaf or have difficulty hearing?: No Does the patient have difficulty seeing, even when wearing glasses/contacts?: No Does the patient have difficulty concentrating, remembering, or making decisions?: Yes Patient able to express need for assistance with ADLs?: Yes Does the patient have difficulty dressing or bathing?: Yes Independently performs ADLs?: No Communication: Independent Dressing (OT): Dependent Is this a change from baseline?:  Pre-admission baseline Grooming: Dependent Is this a change from baseline?: Pre-admission baseline Feeding: Needs assistance Is this a change from baseline?: Pre-admission baseline Bathing: Dependent Is this a change from baseline?: Pre-admission baseline Toileting: Dependent Is this a change from baseline?: Pre-admission baseline In/Out Bed: Dependent Is this a change from baseline?: Pre-admission baseline Walks in Home: Dependent Is this a change from baseline?: Pre-admission baseline Does the patient have difficulty walking or climbing stairs?: Yes Weakness of Legs: Both Weakness of Arms/Hands: Both  Permission Sought/Granted                  Emotional Assessment         Alcohol / Substance Use: Not Applicable Psych Involvement: No (comment)  Admission diagnosis:  Gross hematuria [R31.0] Urinary tract infection with hematuria, site unspecified [N39.0, R31.9] Sepsis, due to unspecified organism, unspecified whether acute organ dysfunction present [facility]) [A41.9] Patient Active Problem List   Diagnosis Date Noted   ABLA (acute blood loss anemia) 07/26/2022   Fecal impaction ([place]) 07/26/2022   Glaucoma 07/26/2022   SVT (supraventricular tachycardia) ([place]) 07/26/2022   Chronic kidney disease, stage 3a ([place]) 07/26/2022   Severe sepsis ([place]) 07/26/2022   Gross hematuria 07/25/2022   AMS (altered mental status) 05/22/2022   Severe sepsis with septic shock ([place]) 05/22/2022   Community acquired pneumonia 05/22/2022   NSTEMI (non-ST elevated myocardial infarction) ([place]) 05/22/2022   Acute respiratory failure  with hypoxia ([place]) 05/22/2022   Anemia 05/22/2022   Lactic acidosis 05/22/2022   AKI (acute kidney injury) ([place]) 05/22/2022   LOC (loss of consciousness) ([place]) 06/22/2020   Seizure ([place]) 06/22/2020   Episode of unresponsiveness    Acute cystitis with hematuria    Syncope 05/07/2020   Dehydration 79/48/0165   Acute metabolic encephalopathy 53/74/8270   Jaw pain     CVA (cerebral vascular accident) ([place]) 04/17/2019   Ischemic stroke ([place]) 07/15/2018   Hypertension 07/15/2018   Dementia ([place]) 07/15/2018   CAD (coronary artery disease) 07/15/2018   PCP:  Bonnita Nasuti, MD Pharmacy:   [facility] - [place] [address], [place] - [address] [place] [address] Phone: [phone] Fax: [phone]     Social Determinants of Health (SDOH) Interventions    Readmission Risk Interventions     No data to display

## 2022-07-26 NOTE — Sepsis Progress Note (Signed)
Notified provider of need to order repeat lactic acid since the lactic acid has increased. Will continue to monitor code sepsis.

## 2022-07-26 NOTE — ED Notes (Signed)
Notified Mullen MD of pt lactic of 3.4.

## 2022-07-26 NOTE — Progress Notes (Signed)
Pharmacy Antibiotic Note  Ronald Matthews is a 88 y.o. male w/ PMH of CVA, dementia, NSTEMI, CAD, HTN, glaucomaadmitted on 07/25/2022 with cystitis with hematuria.  Pharmacy has been consulted for Zosyn dosing.  Plan: start Zosyn 3.375g IV q8h (4 hour infusion).  Height: 5\' 10"  (177.8 cm) Weight: 71.1 kg (156 lb 12 oz) IBW/kg (Calculated) : 73  Temp (24hrs), Avg:98.9 F (37.2 C), Min:98.1 F (36.7 C), Max:99.9 F (37.7 C)  Recent Labs  Lab 07/25/22 2115 07/26/22 0020 07/26/22 0222 07/26/22 0422 07/26/22 0610 07/26/22 0803 07/26/22 1007  WBC 6.6  --   --   --  14.3*  --  12.1*  CREATININE 1.60*  --   --   --  1.45*  --   --   LATICACIDVEN 2.8* 3.4* 2.4* 2.0* 2.8* 2.7*  --     Estimated Creatinine Clearance: 36.1 mL/min (A) (by C-G formula based on SCr of 1.45 mg/dL (H)).    Allergies  Allergen Reactions   Cephalexin Other (See Comments)   Ppd [Tuberculin Purified Protein Derivative] Other (See Comments)    "Allergic," per MAR   Tuberculin Tests Other (See Comments)    "Allergic," per MAR    Antimicrobials this admission: 09/23 ceftriaxone >> 09/24 09/24 Zosyn >>   Microbiology results: 09/24 BCx: pending 09/24 UCx: pending   Thank you for allowing pharmacy to be a part of this patient's care.  Dallie Piles 07/26/2022 1:42 PM

## 2022-07-26 NOTE — Assessment & Plan Note (Addendum)
Urology following.  CT hematuria workup reviewed, showing some clot in the bladder and quite a bit of air in bladder and kidneys, likely from infection. No obvious mass or other finding that would require surgical intervention.  --urine cx pos for ESBL E coli.  Received 5 days of zosyn Plan: --transition to [place] today for 5 more days.

## 2022-07-26 NOTE — Assessment & Plan Note (Addendum)
Stable.  Prior hospital admission with NSTEMI. --resume home ASA today

## 2022-07-26 NOTE — Assessment & Plan Note (Signed)
Due to UTI.  Meets sepsis criteria admission with leukocytosis, tachycardia, UA with infection.  Lactic acidosis consistent with organ dysfunction and severe sepsis. Prior admission was septic shock, in July. --Continue IV antibiotics --Follow cultures --Monitor CBC, fever curve and hemodynamics closely

## 2022-07-26 NOTE — ED Notes (Signed)
Notified MD Daryll Drown pt trop elevated at 22.

## 2022-07-26 NOTE — ED Notes (Signed)
Notified IP MD Mansy of pt lactic increase to 2.8.

## 2022-07-26 NOTE — ED Notes (Signed)
Notified IP MD Mansy and Arbutus Ped of pt elevated trop 1652 per lab.

## 2022-07-26 NOTE — Hospital Course (Signed)
HPI on Admission:  "Ronald Matthews is a 88 y.o. male with medical history significant of stroke, dementia, h/o NSTEMI in July, CAD, HTN, glaucoma who presents for blood clots, blood found in adult diaper, initially this was thought to be GI bleeding, however, when checked stool is brown.  Patient has suprapubic pain and frank blood from his penis.  He reports no trauma at his ALF.  He has not had this before.  I called his sister who reports no FH of prostate or bladder cancer that she is aware of.  Patient also with nausea and emesis, tachycardia and pain in the abdomen.  ...   ED Course: In the ED, he was found to have a Cr of 1.6, Low albumin.  Lactate 2.8.  PCT is 0.17.  WBC is 6.6.  H/H are stable at 13 and 42.  INR is 1.3.  UA showed frank blood, many bacteria, many WBC.  Male purewick in place with frank blood."  Pt was admitted to the hospital, stepdown unit, for further evaluation and management.    9/24: Repeat CBC morning following admission had dropped to 5.8.  Suspect Hbg 13 was in part hemoconcentration, but with ongoing gross hematuria, represents significant blood loss. Two units packed RBC's were ordered for transfusion. Blood pressures soft but with stable MAP's.  Tachycardic with HR 120-130 on monitor in the ED during rounds.   I called patient's sister (HPOA) who stated she wants blood transfusions and evaluation to determine cause of the bleeding.  Despite patient being on hospice at ALF, she reports "he's been doing well", and declines to transition to comfort care measures at this time.

## 2022-07-26 NOTE — ED Notes (Signed)
Pt expressed relief from nausea.

## 2022-07-26 NOTE — ED Notes (Signed)
Pt's temperature noted to increase after starting blood transfusion. Transfusion stopped. Rocephin infusion also started shortly after blood transfusion started. Rocephin also stopped. DO Griffith notified.

## 2022-07-26 NOTE — Assessment & Plan Note (Signed)
Due to combination of hypovolemia with bleeding, and infection/UTI. Resolved.

## 2022-07-26 NOTE — Assessment & Plan Note (Signed)
Baseline Cr isn't clear, but pt likely has CKD-3a.  Cr on admission was 1.60, down from 4.01 in July (when he had septic shock).  Prior baseline was 2 years ago (Cr 0.90). --Monitor BMP --Renally dose meds and avoid nephrotoxins

## 2022-07-26 NOTE — Assessment & Plan Note (Addendum)
No behavioral issues noted. --Delirium precautions Patient is followed by hospice at ALF.  His sister is H POA and declines transition to comfort measures in the setting of severe anemia and recurrent non-STEMI. -- Palliative care consulted

## 2022-07-26 NOTE — Assessment & Plan Note (Addendum)
Prior hx of SVT.  Currently tachycardic and hypotensive due to acute blood loss with gross hematuria. --Continue metoprolol. --Monitor on telemetry. --Replace electrolytes as needed.

## 2022-07-27 DIAGNOSIS — R319 Hematuria, unspecified: Secondary | ICD-10-CM | POA: Diagnosis not present

## 2022-07-27 DIAGNOSIS — N390 Urinary tract infection, site not specified: Secondary | ICD-10-CM

## 2022-07-27 DIAGNOSIS — N3001 Acute cystitis with hematuria: Secondary | ICD-10-CM

## 2022-07-27 DIAGNOSIS — E440 Moderate protein-calorie malnutrition: Secondary | ICD-10-CM | POA: Diagnosis present

## 2022-07-27 DIAGNOSIS — A419 Sepsis, unspecified organism: Secondary | ICD-10-CM | POA: Diagnosis not present

## 2022-07-27 DIAGNOSIS — D62 Acute posthemorrhagic anemia: Secondary | ICD-10-CM | POA: Diagnosis not present

## 2022-07-27 LAB — COMPREHENSIVE METABOLIC PANEL
ALT: 23 U/L (ref 0–44)
AST: 99 U/L — ABNORMAL HIGH (ref 15–41)
Albumin: 2.6 g/dL — ABNORMAL LOW (ref 3.5–5.0)
Alkaline Phosphatase: 56 U/L (ref 38–126)
Anion gap: 5 (ref 5–15)
BUN: 32 mg/dL — ABNORMAL HIGH (ref 8–23)
CO2: 20 mmol/L — ABNORMAL LOW (ref 22–32)
Calcium: 7.8 mg/dL — ABNORMAL LOW (ref 8.9–10.3)
Chloride: 115 mmol/L — ABNORMAL HIGH (ref 98–111)
Creatinine, Ser: 1.51 mg/dL — ABNORMAL HIGH (ref 0.61–1.24)
GFR, Estimated: 44 mL/min — ABNORMAL LOW (ref >60)
Glucose, Bld: 98 mg/dL (ref 70–99)
Potassium: 4.3 mmol/L (ref 3.5–5.1)
Sodium: 140 mmol/L (ref 135–145)
Total Bilirubin: 0.7 mg/dL (ref 0.3–1.2)
Total Protein: 6.2 g/dL — ABNORMAL LOW (ref 6.5–8.1)

## 2022-07-27 LAB — MAGNESIUM: Magnesium: 2.2 mg/dL (ref 1.7–2.4)

## 2022-07-27 LAB — HAPTOGLOBIN: Haptoglobin: 167 mg/dL (ref 38–329)

## 2022-07-27 LAB — HEMOGLOBIN AND HEMATOCRIT, BLOOD
HCT: 23.2 % — ABNORMAL LOW (ref 39.0–52.0)
Hemoglobin: 7.4 g/dL — ABNORMAL LOW (ref 13.0–17.0)

## 2022-07-27 LAB — LACTIC ACID, PLASMA: Lactic Acid, Venous: 1.3 mmol/L (ref 0.5–1.9)

## 2022-07-27 LAB — CBC
HCT: 21.3 % — ABNORMAL LOW (ref 39.0–52.0)
Hemoglobin: 7 g/dL — ABNORMAL LOW (ref 13.0–17.0)
MCH: 28 pg (ref 26.0–34.0)
MCHC: 32.9 g/dL (ref 30.0–36.0)
MCV: 85.2 fL (ref 80.0–100.0)
Platelets: 262 10*3/uL (ref 150–400)
RBC: 2.5 MIL/uL — ABNORMAL LOW (ref 4.22–5.81)
RDW: 16.7 % — ABNORMAL HIGH (ref 11.5–15.5)
WBC: 12 10*3/uL — ABNORMAL HIGH (ref 4.0–10.5)
nRBC: 0 % (ref 0.0–0.2)

## 2022-07-27 LAB — DAT, POLYSPECIFIC AHG (ARMC ONLY): Polyspecific AHG test: NEGATIVE

## 2022-07-27 LAB — TROPONIN I (HIGH SENSITIVITY)
Troponin I (High Sensitivity): 12518 ng/L (ref <18)
Troponin I (High Sensitivity): 12775 ng/L (ref <18)

## 2022-07-27 LAB — GLUCOSE, CAPILLARY: Glucose-Capillary: 101 mg/dL — ABNORMAL HIGH (ref 70–99)

## 2022-07-27 LAB — PROCALCITONIN: Procalcitonin: 2.06 ng/mL

## 2022-07-27 MED ORDER — ENSURE ENLIVE PO LIQD
237.0000 mL | Freq: Three times a day (TID) | ORAL | Status: DC
  Administered 2022-07-27 – 2022-08-03 (×17): 237 mL via ORAL

## 2022-07-27 MED ORDER — ADULT MULTIVITAMIN W/MINERALS CH
1.0000 | ORAL_TABLET | Freq: Every day | ORAL | Status: DC
  Administered 2022-07-28 – 2022-08-03 (×7): 1 via ORAL
  Filled 2022-07-27 (×7): qty 1

## 2022-07-27 NOTE — Consult Note (Signed)
Urology Consult  I have been asked to see the patient by Dr. Denton Lank, for evaluation and management of gross hematuria with acute blood loss anemia.  Chief Complaint: Gross hematuria  History of Present Illness: Ronald Matthews is a 88 y.o. year old male with PMH CVA, dementia, CAD, and tobacco use residing at [facility] and followed by hospice admitted on 07/25/2022 with gross hematuria and sepsis of urinary source.  CODE STATUS is DNR.  CT urogram yesterday revealed moderate bilateral hydroureteronephrosis to the level of the urinary bladder without obstructing stone.  There is air throughout the renal collecting systems.  There is a blood clot within the urinary bladder without defined bladder mass.  Bladder appears full but not significantly distended, but there is an adjacent prominent stool ball consistent with fecal impaction.  Admission labs notable for procalcitonin 0.38; lactate 3.4; WBC count 6.6; hemoglobin 13.1; creatinine 1.60 (baseline unclear); and UA with >50 BC/hpf, >50 WBCs/hpf, and many bacteria.  Blood cultures pending with no growth at 1 day, urine cultures pending; on antibiotics as below.  Since admission, his hemoglobin has dropped significantly with a nadir of 5.8 on admission day 2.  Notably, his admission hemoglobin was felt to be falsely elevated in the setting of hemoconcentration.  He received 2 units of PRBCs yesterday and hemoglobin has increased and is rather stable at 7.0 today.  He is sleeping this morning but arousable. He denies pain.  He has been afebrile, hypotensive, and normocardic this morning. His sister is his HCPOA and declined transition to comfort care yesterday.  Anti-infectives (From admission, onward)    Start     Dose/Rate Route Frequency Ordered Stop   07/26/22 1500  piperacillin-tazobactam (ZOSYN) IVPB 3.375 g        3.375 g 12.5 mL/hr over 240 Minutes Intravenous Every 8 hours 07/26/22 1346     07/26/22 0800  cefTRIAXone (ROCEPHIN) 2  g in sodium chloride 0.9 % 100 mL IVPB  Status:  Discontinued        2 g 200 mL/hr over 30 Minutes Intravenous Every 24 hours 07/26/22 0009 07/26/22 1328   07/25/22 2230  levofloxacin (LEVAQUIN) IVPB 750 mg        750 mg 100 mL/hr over 90 Minutes Intravenous  Once 07/25/22 2227 07/26/22 0106       Past Medical History:  Diagnosis Date   Coronary artery disease    Dementia (HCC)    Glaucoma    Hypertension    Thrombocytopenia (HCC)     Past Surgical History:  Procedure Laterality Date   OTHER SURGICAL HISTORY     UNKNOWN    Home Medications:  Current Meds  Medication Sig   acetaminophen (TYLENOL) 325 MG tablet Take 2 tablets (650 mg total) by mouth every 4 (four) hours as needed for mild pain (or temp > 37.5 C (99.5 F)). (Patient taking differently: Take 650 mg by mouth 3 (three) times daily as needed. Take 650 mg by mouth three times a day and an additional 650 mg as needed for fever, headache, or discomfort)   aspirin 81 MG chewable tablet Chew 81 mg by mouth in the morning.   baclofen (LIORESAL) 10 MG tablet Take 10 mg by mouth 3 (three) times daily.   brimonidine (ALPHAGAN) 0.2 % ophthalmic solution Place 1 drop into both eyes 2 (two) times daily.   brinzolamide (AZOPT) 1 % ophthalmic suspension Place 1 drop into both eyes in the morning and at bedtime.   ipratropium-albuterol (DUONEB)  0.5-2.5 (3) MG/3ML SOLN Take 3 mLs by nebulization every 4 (four) hours as needed (Shortness of Breath or Wheezing).   lactose free nutrition (BOOST) LIQD Take 237 mLs by mouth 3 (three) times daily. VANILLA   levETIRAcetam (KEPPRA) 500 MG tablet Take 1 tablet (500 mg total) by mouth 2 (two) times daily.   Melatonin 10 MG TABS Take 10 mg by mouth at bedtime as needed.   metoprolol succinate (TOPROL-XL) 25 MG 24 hr tablet Take 12.5 mg by mouth daily.    Multiple Vitamins-Minerals (MULTIVITAMINS THER. W/MINERALS) TABS tablet Take 1 tablet by mouth daily.   Skin Protectants, Misc. (MINERIN CREME)  CREA Apply 1 Application topically in the morning and at bedtime.   Travoprost, BAK Free, (TRAVATAN) 0.004 % SOLN ophthalmic solution Place 1 drop into both eyes at bedtime.   traZODone (DESYREL) 50 MG tablet Take 50 mg by mouth at bedtime.   zinc oxide 20 % ointment Apply 1 application  topically See admin instructions. Apply topically to sacrum twice daily    Allergies:  Allergies  Allergen Reactions   Cephalexin Other (See Comments)   Ppd [Tuberculin Purified Protein Derivative] Other (See Comments)    "Allergic," per MAR   Tuberculin Tests Other (See Comments)    "Allergic," per [facility]    Family History  Problem Relation Age of Onset   Prostate cancer Neg Hx    Bladder Cancer Neg Hx     Social History:  reports that he has been smoking. He has never used smokeless tobacco. He reports that he does not drink alcohol and does not use drugs.  ROS: A complete review of systems was performed.  All systems are negative except for pertinent findings as noted.  Physical Exam:  Vital signs in last 24 hours: Temp:  [98.3 F (36.8 C)-98.8 F (37.1 C)] 98.3 F (36.8 C) (09/25 0743) Pulse Rate:  [43-119] 87 (09/25 0800) Resp:  [12-24] 14 (09/25 0600) BP: (79-163)/(41-136) 101/48 (09/25 0800) SpO2:  [95 %-100 %] 100 % (09/25 0800) Constitutional: Asleep but arousable, frail appearing, no acute distress HEENT: [place] AT, moist mucus membranes Cardiovascular: No clubbing, cyanosis, or edema Respiratory: Normal respiratory effort Skin: No rashes, bruises or suspicious lesions Neurologic: Grossly intact, no focal deficits, moving all 4 extremities Psychiatric: Normal mood and affect  Laboratory Data:  Recent Labs    07/26/22 0610 07/26/22 1007 07/26/22 1941 07/27/22 0509  WBC 14.3* 12.1*  --  12.0*  HGB 5.8* 5.8* 7.0* 7.0*  HCT 19.0* 18.9* 21.7* 21.3*   Recent Labs    07/25/22 2115 07/26/22 0610 07/27/22 0654  NA 138 140 140  K 4.7 5.0 4.3  CL 109 116* 115*  CO2 21* 18* 20*   GLUCOSE 188* 130* 98  BUN 33* 30* 32*  CREATININE 1.60* 1.45* 1.51*  CALCIUM 8.5* 7.3* 7.8*   Recent Labs    07/25/22 2115 07/26/22 1007  INR 1.3* 1.5*   Urinalysis    Component Value Date/Time   COLORURINE RED (A) 07/26/2022 1007   APPEARANCEUR CLOUDY (A) 07/26/2022 1007   LABSPEC 1.030 07/26/2022 1007   PHURINE  07/26/2022 1007    TEST NOT REPORTED DUE TO COLOR INTERFERENCE OF URINE PIGMENT   GLUCOSEU (A) 07/26/2022 1007    TEST NOT REPORTED DUE TO COLOR INTERFERENCE OF URINE PIGMENT   HGBUR (A) 07/26/2022 1007    TEST NOT REPORTED DUE TO COLOR INTERFERENCE OF URINE PIGMENT   BILIRUBINUR (A) 07/26/2022 1007    TEST NOT REPORTED DUE  TO COLOR INTERFERENCE OF URINE PIGMENT   KETONESUR (A) 07/26/2022 1007    TEST NOT REPORTED DUE TO COLOR INTERFERENCE OF URINE PIGMENT   PROTEINUR (A) 07/26/2022 1007    TEST NOT REPORTED DUE TO COLOR INTERFERENCE OF URINE PIGMENT   NITRITE (A) 07/26/2022 1007    TEST NOT REPORTED DUE TO COLOR INTERFERENCE OF URINE PIGMENT   LEUKOCYTESUR (A) 07/26/2022 1007    TEST NOT REPORTED DUE TO COLOR INTERFERENCE OF URINE PIGMENT   Results for orders placed or performed during the hospital encounter of 07/25/22  Culture, blood (single)     Status: None (Preliminary result)   Collection Time: 07/26/22 12:31 AM   Specimen: BLOOD  Result Value Ref Range Status   Specimen Description BLOOD BLOOD LEFT FOREARM  Final   Special Requests   Final    BOTTLES DRAWN AEROBIC AND ANAEROBIC Blood Culture adequate volume   Culture   Final    NO GROWTH 1 DAY Performed at [facility], [address] [place], [place] [address]    Report Status PENDING  Incomplete  MRSA Next Gen by PCR, Nasal     Status: Abnormal   Collection Time: 07/26/22  1:17 PM   Specimen: Nasal Mucosa; Nasal Swab  Result Value Ref Range Status   MRSA by PCR Next Gen DETECTED (A) NOT DETECTED Final    Comment: RESULT CALLED TO, READ BACK BY AND VERIFIED WITH: C/UTE JACOBS  07/26/22 1615 SLM (NOTE) The GeneXpert MRSA Assay (FDA approved for NASAL specimens only), is one component of a comprehensive MRSA colonization surveillance program. It is not intended to diagnose MRSA infection nor to guide or monitor treatment for MRSA infections. Test performance is not FDA approved in patients less than 38 years old. Performed at [facility], [address], [place], [place] [address]     Radiologic Imaging: [facility] Chest [place] 1 View  Result Date: 07/26/2022 CLINICAL DATA:  Shortness of breath EXAM: PORTABLE CHEST 1 VIEW COMPARISON:  05/21/2022 and prior studies FINDINGS: The cardiomediastinal silhouette is unchanged. There is no evidence of focal airspace disease, pulmonary edema, suspicious pulmonary nodule/mass, pleural effusion, or pneumothorax. A calcified granuloma within the LOWER RIGHT lung again noted. No acute bony abnormalities are identified. IMPRESSION: No active disease. Electronically Signed   By: Margarette Canada M.D.   On: 07/26/2022 16:12   CT HEMATURIA WORKUP  Result Date: 07/26/2022 CLINICAL DATA:  Gross hematuria. Lower abdominal and pelvic pain. Nausea and vomiting. EXAM: CT ABDOMEN AND PELVIS WITHOUT AND WITH CONTRAST TECHNIQUE: Multidetector CT imaging of the abdomen and pelvis was performed following the standard protocol before and following the bolus administration of intravenous contrast. RADIATION DOSE REDUCTION: This exam was performed according to the departmental dose-optimization program which includes automated exposure control, adjustment of the mA and/or kV according to patient size and/or use of iterative reconstruction technique. CONTRAST:  121mL OMNIPAQUE IOHEXOL 300 MG/ML  SOLN COMPARISON:  None Available. FINDINGS: Lower Chest: Bilateral lower lobe atelectasis versus scarring. Tiny bilateral pleural effusions versus pleural thickening. Aortic and coronary atherosclerotic calcification incidentally noted. Hepatobiliary: No hepatic  masses identified. Multiple small less than 1 cm calcified gallstones are seen, however there is no evidence of cholecystitis or biliary ductal dilatation. Pancreas:  No mass or inflammatory changes. Spleen: Within normal limits in size and appearance. Adrenals/Urinary Tract: No adrenal masses identified. No evidence of urolithiasis. No suspicious renal masses identified. Moderate hydroureteronephrosis is seen bilaterally to the level of the urinary bladder. No evidence  of ureteral calculi or other obstructing etiology. Air is also noted throughout the renal collecting systems, ureters, and bladder. These findings are likely due to recent instrumentation and vesicoureteral reflux. Moderate blood clot is seen within the urinary bladder, however no definite soft tissue masses identified. Stomach/Bowel: A moderate right inguinal hernia is seen which contains the terminal ileum and cecum. Normal appendix visualized. No evidence of obstruction, inflammatory process or abnormal fluid collections. Marked distention of the rectum by stool is seen, suspicious for fecal impaction. Vascular/Lymphatic: No pathologically enlarged lymph nodes. No acute vascular findings. Aortic atherosclerotic calcification incidentally noted. Reproductive:  No mass or other significant abnormality. Other:  None. Musculoskeletal:  No suspicious bone lesions identified. IMPRESSION: Moderate bilateral hydroureteronephrosis to the level of the urinary bladder. No ureteral calculi or other obstructing etiology visualized. Air throughout renal collecting systems, ureters, and bladder, likely due to recent instrumentation and vesicoureteral reflux. Moderate blood clot within urinary bladder. No definite bladder mass identified. Recommend cystoscopy for further evaluation. Moderate right inguinal hernia containing terminal ileum and cecum. No evidence of bowel obstruction. Marked distention of the rectum by stool, suspicious for fecal impaction.  Cholelithiasis. No radiographic evidence of cholecystitis. Bilateral lower lobe atelectasis versus scarring, and tiny bilateral pleural effusions versus pleural thickening. Aortic Atherosclerosis (ICD10-I70.0). Electronically Signed   By: Danae Orleans M.D.   On: 07/26/2022 12:42    Assessment & Plan:  88 year old male hospice patient with dementia admitted with gross hematuria causing acute blood loss anemia s/p 2 units PRBCs and sepsis of likely urinary source.  Differential for gross hematuria includes hemorrhagic cystitis, BPH, and urologic malignancy with bladder cancer being the most likely of these given bilateral hydroureteronephrosis and no significant upper tract findings on CT urogram.  Agree with antibiotics for management of likely urosepsis.  At this point, I recommend defining goals and scope of care in this patient with significant gross hematuria requiring transfusion who is currently being followed by hospice given reports that his HCPOA has declined transition to comfort care.  Depending on the outcome of this conversation, further evaluation of this patient may include cystoscopy under anesthesia with clot evacuation and possible bladder biopsy, though his prognosis would remain poor even with this and with his cardiac comorbidities he may not survive surgical intervention.  I do not recommend urgent urologic intervention at this time.  I recommend avoiding a Foley catheter as it would likely contribute to discomfort and possibly worsen bleeding; agree with periodic bladder scans to ensure appropriate emptying.  We will defer further intervention at this time pending goals of care conversation as above.  Thank you for involving me in this patient's care, I will continue to follow along.  Carman Ching, PA-C 07/27/2022 9:18 AM

## 2022-07-27 NOTE — Progress Notes (Addendum)
Progress Note   Patient: Ronald Matthews SJG:[id number] DOB: 12/03/1932 DOA: 07/25/2022     3 DOS: the patient was seen and examined on 07/28/2022   Brief hospital course: HPI on Admission:  "Ronald Matthews is a 88 y.o. male with medical history significant of stroke, dementia, h/o NSTEMI in July, CAD, HTN, glaucoma who presents for blood clots, blood found in adult diaper, initially this was thought to be GI bleeding, however, when checked stool is brown.  Patient has suprapubic pain and frank blood from his penis.  He reports no trauma at his ALF.  He has not had this before.  I called his sister who reports no FH of prostate or bladder cancer that she is aware of.  Patient also with nausea and emesis, tachycardia and pain in the abdomen.  ...   ED Course: In the ED, he was found to have a Cr of 1.6, Low albumin.  Lactate 2.8.  PCT is 0.17.  WBC is 6.6.  H/H are stable at 13 and 42.  INR is 1.3.  UA showed frank blood, many bacteria, many WBC.  Male purewick in place with frank blood."  Pt was admitted to the hospital, stepdown unit, for further evaluation and management.    9/24: Repeat CBC morning following admission had dropped to 5.8.  Suspect Hbg 13 was in part hemoconcentration, but with ongoing gross hematuria, represents significant blood loss. Two units packed RBC's were ordered for transfusion. Blood pressures soft but with stable MAP's.  Tachycardic with HR 120-130 on monitor in the ED during rounds.   I called patient's sister (HPOA) who stated she wants blood transfusions and evaluation to determine cause of the bleeding.  Despite patient being on hospice at ALF, she reports "he's been doing well", and declines to transition to comfort care measures at this time.      Assessment and Plan: * Acute cystitis with hematuria Urology following.  CT hematuria workup reviewed, showing some clot in the bladder and quite a bit of air in bladder and kidneys, likely from infection. No obvious  mass or other finding that would require surgical intervention.  --Continue IV antibiotics, change to Zosyn based on prior culture data --Added on urine culture, follow --Try to avoid Foley placement --Bladder scans to monitor for retention  ABLA (acute blood loss anemia) Due to gross hematuria.  Hbg initially 13 (suspect falsely high due to hemoconcentration) given recent baseline Hbg was 9 in July. Hbg dropped to 5.8 on morning following admission.  Received 2 units PRBC transfusion (consent obtained from sister/HPOA) 9/25: Hemoglobin 7.0 >> 7.4 --Trend Hbg & transfuse if < 7.0 --Otherwise mgmt as outlined  Note: Patient had an increase in temperature but did not spike fever shortly after first unit blood and started running.  Transfusion was stopped, blood bank was notified and evaluation for hemolysis were ordered.  Blood bank evaluation negative for hemolytic reaction.  She is sites were seen on peripheral smear but were also present on blood draw from prior to the transfusion beginning.  BP was soft but stable at the time. Chest xray no sign of TRALI. --Hematology consulted for input and agreed with resuming transfusion which was completed without issues.   Severe sepsis (HCC) Due to UTI.  Meets sepsis criteria admission with leukocytosis, tachycardia, UA with infection.  Lactic acidosis consistent with organ dysfunction and severe sepsis. Prior admission was septic shock, in July. --Continue IV antibiotics --Follow cultures --Monitor CBC, fever curve and hemodynamics closely  NSTEMI (  non-ST elevated myocardial infarction) ([place]) Type 2 NSTEMI due to demand ischemia in the setting of acute blood loss anemia and gross hematuria.   Pt has no chest pain and EKG non-acute.   Pt also had NSTEMI prior admission in July with troponin peak > 9k.  Medical management was recommended after discussion with cardiology and patient's family.  He is not a candidate for invasive intervention.  He is  followed by hospice.  Troponin trend: 48 >> 1652 >> 2680...[id number]. --Telemetry --Stat EKG if chest pain --Heparin contraindicated with active bleeding and anemia requiring transfusions    Lactic acidosis Due to combination of hypovolemia with bleeding, and infection/UTI. --Trend lactic acid --Getting blood transfusions --IV fluids if needed post-transfusions  CAD (coronary artery disease) Stable.  Prior hospital admission with NSTEMI. Hold aspirin with bleeding and acute anemia.  Gross hematuria Urology consulted. Likely hemorrhagic cystitis. Mgmt as outlined.  Fecal impaction ([place]) CT done for hematuria work up showed rectum distended with stool. --daily Dulcolax suppository in no BM in 24 hrs --Senna-S BID until having regular BM's  Dementia ([place]) No behavioral issues noted. --Delirium precautions Patient is followed by hospice at ALF.  His sister is H POA and declines transition to comfort measures in the setting of severe anemia and recurrent non-STEMI. -- Palliative care consulted  Glaucoma Continue eye drop regimen.  Seizure ([place]) No acute issues. --Continue Keppra  SVT (supraventricular tachycardia) (HCC) Prior hx of SVT.  Currently tachycardic and hypotensive due to acute blood loss with gross hematuria. --Continue metoprolol. --Monitor on telemetry. --Replace electrolytes as needed.  Chronic kidney disease, stage 3a ([place]) Baseline Cr isn't clear, but pt likely has CKD-3a.  Cr on admission was 1.60, down from 4.01 in July (when he had septic shock).  Prior baseline was 2 years ago (Cr 0.90). --Monitor BMP --Renally dose meds and avoid nephrotoxins        Subjective: Patient seen in ICU this morning awake sitting up in bed with blanket over his head due to the bright lights.  He reports feeling okay and denies any acute complaints.  No acute events reported.  Has ongoing hematuria but hemoglobin stabilized.  Appears also improved and stable for transfer  to the floor.    Physical Exam: Vitals:   07/28/22 0045 07/28/22 0332 07/28/22 0445 07/28/22 0812  BP: 108/65  (!) 107/57 131/67  Pulse: 98  89 81  Resp:   14 12  Temp: 98.8 F (37.1 C)  98.7 F (37.1 C) 98.9 F (37.2 C)  TempSrc: Oral  Oral Oral  SpO2: 99%  97% 100%  Weight:  73.6 kg    Height:       General exam: awake, alert, no acute distress HEENT: moist mucus membranes, hearing grossly normal  Respiratory system: CTAB, no wheezes, rales or rhonchi, normal respiratory effort. Cardiovascular system: normal S1/S2, tachycardic, no peripheral edema.   Gastrointestinal system: soft, NT, ND Genitourinary system: gross hematuria in suction canister Central nervous system: no gross focal neurologic deficits, normal speech Skin: dry, intact, normal temperature Psychiatry: dementia, normal mood, congruent affect, abnormal judgement and insight, calm demeanor    Data Reviewed:  Notable labs ---- troponin increased 1652 >> 2680 >> above 12,000 (no chest pain).  Lactate normalized 1.3.  WBC improved 12.0.  Cr 1. 5 1 stable.  Bicarb 20 with gap 5.  Hemoglobin improved 7.0 up to 7.4.  Procalcitonin 2.06   X-ray yesterday negative for findings of TRALI Transfusion evaluation negative for hemolytic transfusion reaction,  transfusions were resumed after discussion with blood bank and hematology, no issues with fever or other reactions.     Family Communication: spoke with HPOA (sister) this AM by phone on AM of 9/24, updated on severe anemia, guarded prognosis.  She declined comfort measures but confirms he is followed by hospice at ALF.  She requested we transfuse and attempt to determine cause of his bleeding.   Disposition: Status is: Inpatient Remains inpatient appropriate because: critically ill with active bleeding and anemia requiring transfusions, on IV antibiotics   Planned Discharge Destination:  Return to ALF with hospice    Time spent: 45 minutes  Author: Ezekiel Slocumb, DO 07/28/2022 9:10 AM  For on call review [email or web address].

## 2022-07-27 NOTE — Progress Notes (Signed)
Initial Nutrition Assessment  DOCUMENTATION CODES:   Non-severe (moderate) malnutrition in context of chronic illness  INTERVENTION:   Ensure Enlive po TID, each supplement provides 350 kcal and 20 grams of protein.  Magic cup TID with meals, each supplement provides 290 kcal and 9 grams of protein  MVI po daily   Mechanical soft diet   Pt at high refeed risk; recommend monitor potassium, magnesium and phosphorus labs daily until stable  NUTRITION DIAGNOSIS:   Moderate Malnutrition related to chronic illness as evidenced by moderate fat depletion, moderate muscle depletion.  GOAL:   Patient will meet greater than or equal to 90% of their needs  MONITOR:   PO intake, Supplement acceptance, Labs, Weight trends, Skin, I & O's  REASON FOR ASSESSMENT:   Malnutrition Screening Tool    ASSESSMENT:   88 y.o. year old male with PMH CVA, dementia, CAD, CKD III, seizures, HTN, NSTEMI and tobacco use who resides at [facility] and is followed by hospice who is admitted with sepsis, UTI and fecal impaction.  Met with pt in room today. Pt is a poor historian but reports good appetite and oral intake pta. Pt reports that he does not like the hospital food but reports that he is "eating the best I can with this food". Pt reports trouble chewing r/t poor dentition. Pt reports that he drinks all three flavors of Ensure. RD reports pt ate well at breakfast; pt documented to be eating 25-75% of meals. RD will add supplements to help pt meet his estimated needs. RD will also change pt to a mechanical soft diet. Pt is at high refeed risk. Per chart, pt is down 13lbs(8%) over the past two months; this is significant.   Medications reviewed and include: MVI, senokot, zosyn   Labs reviewed: K 4.3 wnl, BUN 32(H), creat 1.51(H), Mg 2.2 wnl Wbc- 12.0(H), Hgb 7.4(L), Hct 23.2(L) Cbgs- 101, 110 x 48 hrs   NUTRITION - FOCUSED PHYSICAL EXAM:  Flowsheet Row Most Recent Value  Orbital Region Mild  depletion  Upper Arm Region Mild depletion  Thoracic and Lumbar Region Moderate depletion  Buccal Region Moderate depletion  Temple Region Moderate depletion  Clavicle Bone Region Moderate depletion  Clavicle and Acromion Bone Region Moderate depletion  Scapular Bone Region Moderate depletion  Dorsal Hand Severe depletion  Patellar Region Severe depletion  Anterior Thigh Region Severe depletion  Posterior Calf Region Severe depletion  Edema (RD Assessment) None  Hair Reviewed  Eyes Reviewed  Mouth Reviewed  Skin Reviewed  Nails Reviewed   Diet Order:   Diet Order             DIET DYS 3 Room service appropriate? No; Fluid consistency: Thin  Diet effective now                  EDUCATION NEEDS:   No education needs have been identified at this time  Skin:  Skin Assessment: Reviewed RN Assessment  Last BM:  9/25- type 6  Height:   Ht Readings from Last 1 Encounters:  07/25/22 '5\' 10"'  (1.778 m)    Weight:   Wt Readings from Last 1 Encounters:  07/25/22 71.1 kg    Ideal Body Weight:  75.45 kg  BMI:  Body mass index is 22.49 kg/m.  Estimated Nutritional Needs:   Kcal:  1800-2100kcal/day  Protein:  90-105g/day  Fluid:  1.8-2.1L/day  Koleen Distance MS, RD, LDN Please refer to [facility] for RD and/or RD on-call/weekend/after hours pager

## 2022-07-28 DIAGNOSIS — N3001 Acute cystitis with hematuria: Secondary | ICD-10-CM | POA: Diagnosis not present

## 2022-07-28 LAB — CBC
HCT: 21.2 % — ABNORMAL LOW (ref 39.0–52.0)
Hemoglobin: 6.8 g/dL — ABNORMAL LOW (ref 13.0–17.0)
MCH: 27.5 pg (ref 26.0–34.0)
MCHC: 32.1 g/dL (ref 30.0–36.0)
MCV: 85.8 fL (ref 80.0–100.0)
Platelets: 229 10*3/uL (ref 150–400)
RBC: 2.47 MIL/uL — ABNORMAL LOW (ref 4.22–5.81)
RDW: 16.8 % — ABNORMAL HIGH (ref 11.5–15.5)
WBC: 10.2 10*3/uL (ref 4.0–10.5)
nRBC: 0 % (ref 0.0–0.2)

## 2022-07-28 LAB — GLUCOSE, CAPILLARY
Glucose-Capillary: 108 mg/dL — ABNORMAL HIGH (ref 70–99)
Glucose-Capillary: 91 mg/dL (ref 70–99)
Glucose-Capillary: 158 mg/dL — ABNORMAL HIGH (ref 70–99)
Glucose-Capillary: 108 mg/dL — ABNORMAL HIGH (ref 70–99)

## 2022-07-28 LAB — HEMOGLOBIN AND HEMATOCRIT, BLOOD
HCT: 21.2 % — ABNORMAL LOW (ref 39.0–52.0)
Hemoglobin: 6.7 g/dL — ABNORMAL LOW (ref 13.0–17.0)

## 2022-07-28 LAB — BASIC METABOLIC PANEL
Anion gap: 7 (ref 5–15)
BUN: 29 mg/dL — ABNORMAL HIGH (ref 8–23)
CO2: 21 mmol/L — ABNORMAL LOW (ref 22–32)
Calcium: 7.7 mg/dL — ABNORMAL LOW (ref 8.9–10.3)
Chloride: 112 mmol/L — ABNORMAL HIGH (ref 98–111)
Creatinine, Ser: 1.38 mg/dL — ABNORMAL HIGH (ref 0.61–1.24)
GFR, Estimated: 49 mL/min — ABNORMAL LOW (ref >60)
Glucose, Bld: 110 mg/dL — ABNORMAL HIGH (ref 70–99)
Potassium: 3.5 mmol/L (ref 3.5–5.1)
Sodium: 140 mmol/L (ref 135–145)

## 2022-07-28 LAB — TRANSFUSION REACTION
DAT C3: NEGATIVE
Post RXN DAT IgG: NEGATIVE

## 2022-07-28 LAB — PREPARE RBC (CROSSMATCH)

## 2022-07-28 LAB — MAGNESIUM: Magnesium: 2.2 mg/dL (ref 1.7–2.4)

## 2022-07-28 LAB — TROPONIN I (HIGH SENSITIVITY): Troponin I (High Sensitivity): 8416 ng/L (ref <18)

## 2022-07-28 MED ORDER — SODIUM CHLORIDE 0.9% IV SOLUTION: Freq: Once | INTRAVENOUS | Status: AC

## 2022-07-28 NOTE — Assessment & Plan Note (Signed)
Moderate Malnutrition related to chronic illness as evidenced by moderate fat depletion, moderate muscle depletion. --Appreciate dietitian recommendations --Started on Ensures, Magic cups TID, Multivitamin --monitor for refeeding syndrome

## 2022-07-28 NOTE — Progress Notes (Signed)
Progress Note   Patient: Ronald Matthews JJK:[id number] DOB: 12/04/1932 DOA: 07/25/2022     3 DOS: the patient was seen and examined on 07/28/2022   Brief hospital course: HPI on Admission:  "Ronald Matthews is a 88 y.o. male with medical history significant of stroke, dementia, h/o NSTEMI in July, CAD, HTN, glaucoma who presents for blood clots, blood found in adult diaper, initially this was thought to be GI bleeding, however, when checked stool is brown.  Patient has suprapubic pain and frank blood from his penis.  He reports no trauma at his ALF.  He has not had this before.  I called his sister who reports no FH of prostate or bladder cancer that she is aware of.  Patient also with nausea and emesis, tachycardia and pain in the abdomen.  ...   ED Course: In the ED, he was found to have a Cr of 1.6, Low albumin.  Lactate 2.8.  PCT is 0.17.  WBC is 6.6.  H/H are stable at 13 and 42.  INR is 1.3.  UA showed frank blood, many bacteria, many WBC.  Male purewick in place with frank blood."  Pt was admitted to the hospital, stepdown unit, for further evaluation and management.    9/24: Repeat CBC morning following admission had dropped to 5.8.  Suspect Hbg 13 was in part hemoconcentration, but with ongoing gross hematuria, represents significant blood loss. Two units packed RBC's were ordered for transfusion. Blood pressures soft but with stable MAP's.  Tachycardic with HR 120-130 on monitor in the ED during rounds.   I called patient's sister (HPOA) who stated she wants blood transfusions and evaluation to determine cause of the bleeding.  Despite patient being on hospice at ALF, she reports "he's been doing well", and declines to transition to comfort care measures at this time.      Assessment and Plan: * Acute cystitis with hematuria Urology following.  CT hematuria workup reviewed, showing some clot in the bladder and quite a bit of air in bladder and kidneys, likely from infection. No obvious  mass or other finding that would require surgical intervention.  --Urology following - see their recs --Continue IV antibiotics, changed to Zosyn based on prior culture data --Added on urine culture, pending --Try to avoid Foley placement --Bladder scans to monitor for retention --Continue GOC discussions, poor candidate for any invasive evaluation  ABLA (acute blood loss anemia) Due to gross hematuria.  Hbg initially 13 (suspect falsely high due to hemoconcentration) given recent baseline Hbg was 9 in July. Hbg dropped to 5.8 on morning following admission.  Received 2 units PRBC transfusion (consent obtained from sister/HPOA).   9/25: Hemoglobin 7.0 >> 7.4 9/26: Hbg 6.8 --Repeat H&H this afternoon --Trend Hbg & transfuse if < 7.0 --Otherwise mgmt as outlined  Severe sepsis (HCC) Due to UTI.  Meets sepsis criteria admission with leukocytosis, tachycardia, UA with infection.  Lactic acidosis consistent with organ dysfunction and severe sepsis. Prior admission was septic shock, in July. --Continue IV antibiotics --Follow cultures --Monitor CBC, fever curve and hemodynamics closely  NSTEMI (non-ST elevated myocardial infarction) ([place]) Type 2 NSTEMI due to demand ischemia in the setting of acute blood loss anemia and gross hematuria.   Pt has no chest pain and EKG non-acute.   Pt also had NSTEMI prior admission in July with troponin peak > 9k.  Medical management was recommended after discussion with cardiology and patient's family.  He is not a candidate for invasive intervention.  He  is followed by hospice.  Troponin trend: 48 >> 1652 >> 2680...[id number]. --Telemetry --Stat EKG if chest pain --Heparin contraindicated with active bleeding and anemia requiring transfusions    Lactic acidosis Due to combination of hypovolemia with bleeding, and infection/UTI. Resolved.  CAD (coronary artery disease) Stable.  Prior hospital admission with NSTEMI. Hold aspirin with bleeding and acute  anemia.  Gross hematuria Urology consulted. Likely hemorrhagic cystitis. Mgmt as outlined.  Fecal impaction ([place]) CT done for hematuria work up showed rectum distended with stool. --daily Dulcolax suppository in no BM in 24 hrs --Senna-S BID until having regular BM's  Dementia ([place]) No behavioral issues noted. --Delirium precautions Patient is followed by hospice at ALF.  His sister is H POA and declines transition to comfort measures in the setting of severe anemia and recurrent non-STEMI. -- Palliative care consulted  Glaucoma Continue eye drop regimen.  Seizure ([place]) No acute issues. --Continue Keppra  SVT (supraventricular tachycardia) (HCC) Prior hx of SVT.  Currently tachycardic and hypotensive due to acute blood loss with gross hematuria. --Continue metoprolol. --Monitor on telemetry. --Replace electrolytes as needed.  Malnutrition of moderate degree Moderate Malnutrition related to chronic illness as evidenced by moderate fat depletion, moderate muscle depletion.  --Appreciate dietitian recommendations --Started on Ensures, Magic cups TID, Multivitamin --monitor for refeeding syndrome  Chronic kidney disease, stage 3a ([place]) Baseline Cr isn't clear, but pt likely has CKD-3a.  Cr on admission was 1.60, down from 4.01 in July (when he had septic shock).  Prior baseline was 2 years ago (Cr 0.90). --Monitor BMP --Renally dose meds and avoid nephrotoxins        Subjective: Patient was sleeping but woke to voice. Denies pain, feeling sick or other complaints. No acute events reported.  Has ongoing hematuria but hemodynamically stable and seems improving.    Physical Exam: Vitals:   07/28/22 0812 07/28/22 1139 07/28/22 1512 07/28/22 1749  BP: 131/67 118/67 (!) 129/59 124/65  Pulse: 81 78 86 83  Resp: 12 16  16   Temp: 98.9 F (37.2 C) 98.9 F (37.2 C) 99 F (37.2 C) 99.8 F (37.7 C)  TempSrc: Oral Oral Oral Oral  SpO2: 100% 99% 99% 100%  Weight:       Height:       General exam: sleeping, woke to voice, no acute distress HEENT: moist mucus membranes, hearing grossly normal  Respiratory system: CTAB, no wheezes, rales or rhonchi, normal respiratory effort. Cardiovascular system: normal S1/S2, tachycardic, no peripheral edema.   Gastrointestinal system: soft, NT, ND Genitourinary system: gross hematuria in suction canister Central nervous system: no gross focal neurologic deficits, normal speech Skin: dry, intact, normal temperature Psychiatry: dementia, normal mood, congruent affect, abnormal judgement and insight, calm demeanor    Data Reviewed:  Notable labs ---- Cl 112, CO2 21, glucose 110, BUN 29, Cr 1.38 from 1.51, Ca 7.7, Troponin trended down from above 12k to 8416 (pt never had chest pain), Hbg 6.8 from 7.4   Family Communication: spoke with HPOA (sister) this AM by phone on AM of 9/24, updated on severe anemia, guarded prognosis.  She declined comfort measures but confirms he is followed by hospice at ALF.  She requested we transfuse and attempt to determine cause of his bleeding.   Disposition: Status is: Inpatient Remains inpatient appropriate because: on IV antibiotics and closely monitoring anemia with ongoing hematuria   Planned Discharge Destination:  Return to ALF with hospice    Time spent: 35 minutes  Author: Ezekiel Slocumb, DO 07/28/2022  7:29 PM  For on call review [email or web address].

## 2022-07-29 DIAGNOSIS — F0150 Vascular dementia without behavioral disturbance: Secondary | ICD-10-CM | POA: Diagnosis not present

## 2022-07-29 DIAGNOSIS — Z515 Encounter for palliative care: Secondary | ICD-10-CM

## 2022-07-29 DIAGNOSIS — Z7189 Other specified counseling: Secondary | ICD-10-CM

## 2022-07-29 DIAGNOSIS — N3001 Acute cystitis with hematuria: Secondary | ICD-10-CM | POA: Diagnosis not present

## 2022-07-29 DIAGNOSIS — Z66 Do not resuscitate: Secondary | ICD-10-CM

## 2022-07-29 LAB — BASIC METABOLIC PANEL
Anion gap: 6 (ref 5–15)
BUN: 27 mg/dL — ABNORMAL HIGH (ref 8–23)
CO2: 22 mmol/L (ref 22–32)
Calcium: 8 mg/dL — ABNORMAL LOW (ref 8.9–10.3)
Chloride: 114 mmol/L — ABNORMAL HIGH (ref 98–111)
Creatinine, Ser: 1.32 mg/dL — ABNORMAL HIGH (ref 0.61–1.24)
GFR, Estimated: 52 mL/min — ABNORMAL LOW (ref >60)
Glucose, Bld: 89 mg/dL (ref 70–99)
Potassium: 3.7 mmol/L (ref 3.5–5.1)
Sodium: 142 mmol/L (ref 135–145)

## 2022-07-29 LAB — CBC
HCT: 24.8 % — ABNORMAL LOW (ref 39.0–52.0)
Hemoglobin: 8.1 g/dL — ABNORMAL LOW (ref 13.0–17.0)
MCH: 28.5 pg (ref 26.0–34.0)
MCHC: 32.7 g/dL (ref 30.0–36.0)
MCV: 87.3 fL (ref 80.0–100.0)
Platelets: 263 10*3/uL (ref 150–400)
RBC: 2.84 MIL/uL — ABNORMAL LOW (ref 4.22–5.81)
RDW: 16.5 % — ABNORMAL HIGH (ref 11.5–15.5)
WBC: 10.1 10*3/uL (ref 4.0–10.5)
nRBC: 0 % (ref 0.0–0.2)

## 2022-07-29 LAB — GLUCOSE, CAPILLARY
Glucose-Capillary: 94 mg/dL (ref 70–99)
Glucose-Capillary: 205 mg/dL — ABNORMAL HIGH (ref 70–99)
Glucose-Capillary: 146 mg/dL — ABNORMAL HIGH (ref 70–99)

## 2022-07-29 NOTE — Consult Note (Signed)
Consultation Note Date: 07/29/2022   Patient Name: Ronald Matthews  DOB: [birth year]  MRN: [id number]  Age / Sex: 88 y.o., male  PCP: Bonnita Nasuti, MD Referring Physician: Enzo Bi, MD  Reason for Consultation: Establishing goals of care  HPI/Patient Profile: 88 y.o. male  with past medical history of stroke, dementia, h/o NSTEMI in July, CAD, HTN, and glaucoma admitted on 07/25/2022 with frank blood from penis.  Hemoglobin dropped to 5.8 throughout hospitalization.  Required several blood transfusions.  Patient diagnosed with acute cystitis with hematuria and severe sepsis.  Patient on hospice services at ALF.  PMT consulted for goals of care.  Clinical Assessment and Goals of Care: I have reviewed medical records including EPIC notes, labs and imaging, assessed the patient and then spoke with patient's sister who is listed as HCPOA, [name], to discuss diagnosis prognosis, GOC, EOL wishes, disposition and options.  Patient tells me he is not in pain, just feels tired.  Wants to sleep.  He is confused about place and situation.  While speaking with sister, she reviews with me that she is HCPOA.  She tells me the patient does have 2 children but they have not been involved in medical decision-making.  I introduced Palliative Medicine as specialized medical care for people living with serious illness. It focuses on providing relief from the symptoms and stress of a serious illness. The goal is to improve quality of life for both the patient and the family.  She tells me about patient's stay at assisted living facility.  She tells me he is wheelchair-bound but able to move his wheelchair himself.  She tells me she thinks he eats well but she has noticed weight loss over the past couple of months.   We discussed patient's current illness and what it means in the larger context of patient's on-going co-morbidities.  Natural disease trajectory and  expectations at EOL were discussed.  We discussed his UTI with hematuria.  We discussed he has been eating well last night and this morning.  Discussed required blood transfusion again last night.  Discussed ongoing monitoring of hemoglobin.  I attempted to elicit values and goals of care important to the patient.    The difference between aggressive medical intervention and comfort care was considered in light of the patient's goals of care.   Discussed with sister the importance of continued conversation with family and the medical providers regarding overall plan of care and treatment options, ensuring decisions are within the context of the patients values and GOCs.    Sister is hopeful for continued improvement in her return to his ALF with hospice services.  Questions and concerns were addressed. The family was encouraged to call with questions or concerns.  Primary Decision Maker HCPOA/Sister [name]    SUMMARY OF RECOMMENDATIONS   -Continue current measures -sister understands patient is not a candidate for any additional interventions but she would like to continue close monitoring of hemoglobin and blood transfusions as needed at this time -She is hopeful for discharge back to assisted living facility with ongoing hospice involvement  Code Status/Advance Care Planning: DNR  Discharge Planning: ALF with hospice     Primary Diagnoses: Present on Admission:  Gross hematuria  Acute cystitis with hematuria  Dementia ([place])  Fecal impaction (HCC)  Glaucoma  CAD (coronary artery disease)  NSTEMI (non-ST elevated myocardial infarction) ([place])  Lactic acidosis  (Resolved) Acute renal failure superimposed on stage 3a chronic kidney disease ([place])  Chronic kidney disease, stage 3a (  [place])  Severe sepsis ([place])  Malnutrition of moderate degree   I have reviewed the medical record, interviewed the patient and family, and examined the patient. The following aspects are  pertinent.  Past Medical History:  Diagnosis Date   Coronary artery disease    Dementia (HCC)    Glaucoma    Hypertension    Thrombocytopenia ([place])    Social History   Socioeconomic History   Marital status: Single    Spouse name: Not on file   Number of children: Not on file   Years of education: Not on file   Highest education level: Not on file  Occupational History   Not on file  Tobacco Use   Smoking status: Every Day   Smokeless tobacco: Never  Substance and Sexual Activity   Alcohol use: No   Drug use: No   Sexual activity: Never  Other Topics Concern   Not on file  Social History Narrative   Not on file   Social Determinants of Health   Financial Resource Strain: Not on file  Food Insecurity: No Food Insecurity (07/26/2022)   Hunger Vital Sign    Worried About Running Out of Food in the Last Year: Never true    Ran Out of Food in the Last Year: Never true  Transportation Needs: No Transportation Needs (07/26/2022)   PRAPARE - Hydrologist (Medical): No    Lack of Transportation (Non-Medical): No  Physical Activity: Not on file  Stress: Not on file  Social Connections: Not on file   Family History  Problem Relation Age of Onset   Prostate cancer Neg Hx    Bladder Cancer Neg Hx    Scheduled Meds:  baclofen  10 mg Oral TID   brimonidine  1 drop Both Eyes BID   brinzolamide  1 drop Both Eyes TID   Chlorhexidine Gluconate Cloth  6 each Topical Q0600   feeding supplement  237 mL Oral TID BM   latanoprost  1 drop Both Eyes QHS   levETIRAcetam  500 mg Oral BID   metoprolol succinate  12.5 mg Oral Daily   multivitamin with minerals  1 tablet Oral Daily   mupirocin ointment  1 Application Nasal BID   mouth rinse  15 mL Mouth Rinse 4 times per day   senna-docusate  1 tablet Oral BID   traZODone  50 mg Oral QHS   Continuous Infusions:  piperacillin-tazobactam (ZOSYN)  IV 3.375 g (07/29/22 0536)   PRN Meds:.acetaminophen,  bisacodyl, melatonin, morphine injection, nitroGLYCERIN, ondansetron, mouth rinse, oxyCODONE Allergies  Allergen Reactions   Cephalexin Other (See Comments)   Ppd [Tuberculin Purified Protein Derivative] Other (See Comments)    "Allergic," per MAR   Tuberculin Tests Other (See Comments)    "Allergic," per MAR   Review of Systems  Unable to perform ROS: Dementia    Physical Exam Constitutional:      General: He is not in acute distress.    Appearance: He is ill-appearing.  Pulmonary:     Effort: Pulmonary effort is normal.  Skin:    General: Skin is warm and dry.  Neurological:     Mental Status: He is alert. He is disoriented.     Vital Signs: BP 114/76 (BP Location: Right Arm)   Pulse 69   Temp 99.1 F (37.3 C) (Oral)   Resp 18   Ht 5\' 10"  (1.778 m)   Wt 73.6 kg   SpO2 100%   BMI  23.28 kg/m  Pain Scale: 0-10   Pain Score: 0-No pain   SpO2: SpO2: 100 % O2 Device:SpO2: 100 % O2 Flow Rate: .   IO: Intake/output summary:  Intake/Output Summary (Last 24 hours) at 07/29/2022 1150 Last data filed at 07/29/2022 0800 Gross per 24 hour  Intake 80.19 ml  Output 1300 ml  Net -1219.81 ml    LBM: Last BM Date : 07/27/22 Baseline Weight: Weight: 71.1 kg Most recent weight: Weight: 73.6 kg     Palliative Assessment/Data: PPS 40%     *Please note that this is a verbal dictation therefore any spelling or grammatical errors are due to the "[place] [employer]" system interpretation.  Juel Burrow, DNP, AGNP-C Palliative Medicine Team [phone] Pager: [phone]

## 2022-07-29 NOTE — Progress Notes (Signed)
PROGRESS NOTE    Ronald Matthews  ZOX:[id number] DOB: 12-27-1932 DOA: 07/25/2022 PCP: Bonnita Nasuti, MD  252A/252A-AA  LOS: 4 days   Brief hospital course:   Assessment & Plan: "Ronald Matthews is a 88 y.o. male with medical history significant of stroke, dementia, h/o NSTEMI in July, CAD, HTN, glaucoma who presents for blood clots, blood found in adult diaper, initially this was thought to be GI bleeding, however, when checked stool is brown.  Patient has suprapubic pain and frank blood from his penis.  He reports no trauma at his ALF.  He has not had this before.   * Acute cystitis with hematuria Urology following.  CT hematuria workup reviewed, showing some clot in the bladder and quite a bit of air in bladder and kidneys, likely from infection. No obvious mass or other finding that would require surgical intervention.  --Urology following - see their recs --Continue IV antibiotics, changed to Zosyn based on prior culture data Plan: --cont zosyn pending cx sensitivities  ABLA (acute blood loss anemia) Due to gross hematuria.  Hbg initially 13 (suspect falsely high due to hemoconcentration) given recent baseline Hbg was 9 in July. Hbg dropped to 5.8 on morning following admission.  Received total 3 units PRBC Plan: --monitor Hgb and transfuse to keep Hgb >7  Severe sepsis (HCC) Due to UTI.  Meets sepsis criteria admission with leukocytosis, tachycardia, UA with infection.  Lactic acidosis consistent with organ dysfunction and severe sepsis.  NSTEMI (non-ST elevated myocardial infarction) ([place]) Type 2 NSTEMI due to demand ischemia in the setting of acute blood loss anemia and gross hematuria.   Troponin trend: 48 >> 1652 >> 2680...[id number]. Pt has no chest pain and EKG non-acute.   Pt also had NSTEMI prior admission in July with troponin peak > 9k.  Medical management was recommended after discussion with cardiology and patient's family.  He is not a candidate for invasive intervention.   He is followed by hospice. --Heparin contraindicated with active bleeding and anemia requiring transfusions Plan: --Stat EKG if chest pain     Lactic acidosis Due to combination of hypovolemia with bleeding, and infection/UTI. Resolved.  CAD (coronary artery disease) Stable.  Prior hospital admission with NSTEMI. Hold aspirin with bleeding and acute anemia.  Gross hematuria Urology consulted. Likely hemorrhagic cystitis. Mgmt as outlined.  Fecal impaction ([place]) CT done for hematuria work up showed rectum distended with stool. --bowel regimen  Dementia ([place]) No behavioral issues noted. --Delirium precautions Patient is followed by hospice at ALF.  His sister is H POA and declines transition to comfort measures in the setting of severe anemia and recurrent non-STEMI. -- Palliative care consulted  Glaucoma Continue eye drop regimen.  History of seizure No acute issues. --Continue Keppra  History of supraventricular tachycardia --Continue metoprolol.  Malnutrition of moderate degree Moderate Malnutrition related to chronic illness as evidenced by moderate fat depletion, moderate muscle depletion. --supplements per dietician  Chronic kidney disease, stage 3a ([place]) Baseline Cr isn't clear, but pt likely has CKD-3a.  Cr on admission was 1.60, down from 4.01 in July (when he had septic shock).  Prior baseline was 2 years ago (Cr 0.90). --Monitor BMP --Renally dose meds and avoid nephrotoxins   DVT prophylaxis: SCD/Compression stockings Code Status: DNR  Family Communication:  Level of care: Progressive Dispo:   The patient is from: [facility] Anticipated d/c is to: [facility] Anticipated d/c date is: 2-3 days   Subjective and Interval History:  Pt reported still having blood  in his urine.  No dysuria.     Objective: Vitals:   07/29/22 0805 07/29/22 1151 07/29/22 1548 07/29/22 1605  BP: 114/76 117/65 133/78   Pulse: 69 76 72 69  Resp: 18 16     Temp: 99.1 F (37.3 C) 98.6 F (37 C) 99.3 F (37.4 C)   TempSrc: Oral Oral Oral   SpO2: 100% 100% (!) 82% 100%  Weight:      Height:        Intake/Output Summary (Last 24 hours) at 07/29/2022 1839 Last data filed at 07/29/2022 1100 Gross per 24 hour  Intake --  Output 1600 ml  Net -1600 ml   Filed Weights   07/25/22 2023 07/28/22 0332  Weight: 71.1 kg 73.6 kg    Examination:   Constitutional: NAD, alert, oriented HEENT: conjunctivae and lids normal, EOMI CV: No cyanosis.   RESP: normal respiratory effort, on RA Neuro: II - XII grossly intact.     Data Reviewed: I have personally reviewed labs and imaging studies  Time spent: 50 minutes  Enzo Bi, MD [facility] If 7PM-7AM, please contact night-coverage 07/29/2022, 6:39 PM

## 2022-07-30 DIAGNOSIS — N3001 Acute cystitis with hematuria: Secondary | ICD-10-CM | POA: Diagnosis not present

## 2022-07-30 LAB — CBC
HCT: 25.7 % — ABNORMAL LOW (ref 39.0–52.0)
Hemoglobin: 8.4 g/dL — ABNORMAL LOW (ref 13.0–17.0)
MCH: 28.4 pg (ref 26.0–34.0)
MCHC: 32.7 g/dL (ref 30.0–36.0)
MCV: 86.8 fL (ref 80.0–100.0)
Platelets: 256 10*3/uL (ref 150–400)
RBC: 2.96 MIL/uL — ABNORMAL LOW (ref 4.22–5.81)
RDW: 16.8 % — ABNORMAL HIGH (ref 11.5–15.5)
WBC: 7.9 10*3/uL (ref 4.0–10.5)
nRBC: 0 % (ref 0.0–0.2)

## 2022-07-30 LAB — TYPE AND SCREEN
ABO/RH(D): B POS
Antibody Screen: NEGATIVE
Unit division: 0
Unit division: 0
Unit division: 0

## 2022-07-30 LAB — URINE CULTURE: Culture: >=100000 — AB

## 2022-07-30 LAB — BPAM RBC
Blood Product Expiration Date: 202310242359
Blood Product Expiration Date: 202310242359
Blood Product Expiration Date: 202310252359
ISSUE DATE / TIME: 202309240732
ISSUE DATE / TIME: 202309241526
ISSUE DATE / TIME: 202309262125
Unit Type and Rh: 7300
Unit Type and Rh: 7300
Unit Type and Rh: 7300

## 2022-07-30 LAB — BASIC METABOLIC PANEL
Anion gap: 6 (ref 5–15)
BUN: 24 mg/dL — ABNORMAL HIGH (ref 8–23)
CO2: 21 mmol/L — ABNORMAL LOW (ref 22–32)
Calcium: 7.9 mg/dL — ABNORMAL LOW (ref 8.9–10.3)
Chloride: 112 mmol/L — ABNORMAL HIGH (ref 98–111)
Creatinine, Ser: 1.11 mg/dL (ref 0.61–1.24)
GFR, Estimated: >60 mL/min (ref >60)
Glucose, Bld: 92 mg/dL (ref 70–99)
Potassium: 3.9 mmol/L (ref 3.5–5.1)
Sodium: 139 mmol/L (ref 135–145)

## 2022-07-30 LAB — GLUCOSE, CAPILLARY
Glucose-Capillary: 93 mg/dL (ref 70–99)
Glucose-Capillary: 195 mg/dL — ABNORMAL HIGH (ref 70–99)
Glucose-Capillary: 147 mg/dL — ABNORMAL HIGH (ref 70–99)

## 2022-07-30 LAB — MAGNESIUM: Magnesium: 2 mg/dL (ref 1.7–2.4)

## 2022-07-30 NOTE — TOC Transition Note (Signed)
Transition of Care [facility]) - CM/SW Discharge Note   Patient Details  Name: [name] MRN: [id number] Date of Birth: [birth year]  [facility] CM/SW Contact:  Alberteen Sam, LCSW Phone Number: 07/30/2022, 1:20 PM   Clinical Narrative:     CSW confirmed with patient's sister patient will return to [facility] with Hospice, as [facility] had previously expressed concerns that patient would be dropping hospice services. This was verified as not accurate by both patient's sister and palliative.   CSW has since attempted to contact [place] to update on the above multiple times with no answer or response. TOC supervisor aware.      Final next level of care: Assisted Living Barriers to Discharge: No Barriers Identified   Patient Goals and CMS Choice Patient states their goals for this hospitalization and ongoing recovery are:: to go home CMS Medicare.gov Compare Post Acute Care list provided to:: Patient Choice offered to / list presented to : Patient  Discharge Placement                    Patient and family notified of of transfer: 07/30/22  Discharge Plan and Services                                     Social Determinants of Health (SDOH) Interventions     Readmission Risk Interventions     No data to display

## 2022-07-30 NOTE — Progress Notes (Signed)
PROGRESS NOTE    Ronald Matthews  KGU:[id number] DOB: April 27, 1933 DOA: 07/25/2022 PCP: Bonnita Nasuti, MD  252A/252A-AA  LOS: 5 days   Brief hospital course:   Assessment & Plan: "Ronald Matthews is a 88 y.o. male with medical history significant of stroke, dementia, h/o NSTEMI in July, CAD, HTN, glaucoma who presents for blood clots, blood found in adult diaper, initially this was thought to be GI bleeding, however, when checked stool is brown.  Patient has suprapubic pain and frank blood from his penis.  He reports no trauma at his ALF.  He has not had this before.   * Acute cystitis with hematuria Urology following.  CT hematuria workup reviewed, showing some clot in the bladder and quite a bit of air in bladder and kidneys, likely from infection. No obvious mass or other finding that would require surgical intervention.  --urine cx pos for ESBL E coli Plan: --cont zosyn  --will discharge on Macrobid for total 10-day course  ABLA (acute blood loss anemia) Due to gross hematuria.  Hbg initially 13 (suspect falsely high due to hemoconcentration) given recent baseline Hbg was 9 in July. Hbg dropped to 5.8 on morning following admission.  Received total 3 units PRBC Plan: --monitor Hgb and transfuse to keep Hgb >7  Severe sepsis (HCC) Due to UTI.  Meets sepsis criteria admission with leukocytosis, tachycardia, UA with infection.  Lactic acidosis consistent with organ dysfunction and severe sepsis.  NSTEMI (non-ST elevated myocardial infarction) ([place]) Type 2 NSTEMI due to demand ischemia in the setting of acute blood loss anemia and gross hematuria.   Troponin trend: 48 >> 1652 >> 2680...[id number]. Pt has no chest pain and EKG non-acute.   Pt also had NSTEMI prior admission in July with troponin peak > 9k.  Medical management was recommended after discussion with cardiology and patient's family.  He is not a candidate for invasive intervention.  He is followed by hospice. --Heparin  contraindicated with active bleeding and anemia requiring transfusions Plan: --Stat EKG if chest pain     Lactic acidosis Due to combination of hypovolemia with bleeding, and infection/UTI. Resolved.  CAD (coronary artery disease) Stable.  Prior hospital admission with NSTEMI. Hold aspirin with bleeding and acute anemia.  Gross hematuria Urology consulted. Likely hemorrhagic cystitis. Mgmt as outlined.  Fecal impaction ([place]) CT done for hematuria work up showed rectum distended with stool. --bowel regimen  Dementia ([place]) No behavioral issues noted. --Delirium precautions Patient is followed by hospice at ALF.  His sister is H POA and declines transition to comfort measures in the setting of severe anemia and recurrent non-STEMI. -- Palliative care consulted  Glaucoma Continue eye drop regimen.  History of seizure No acute issues. --Continue Keppra  History of supraventricular tachycardia --Continue metoprolol.  Malnutrition of moderate degree Moderate Malnutrition related to chronic illness as evidenced by moderate fat depletion, moderate muscle depletion. --supplements per dietician  Chronic kidney disease, stage 3a ([place]) Baseline Cr isn't clear, but pt likely has CKD-3a.  Cr on admission was 1.60, down from 4.01 in July (when he had septic shock).  Prior baseline was 2 years ago (Cr 0.90). --Monitor BMP --Renally dose meds and avoid nephrotoxins   DVT prophylaxis: SCD/Compression stockings Code Status: DNR  Family Communication:  Level of care: Progressive Dispo:   The patient is from: [facility] Anticipated d/c is to: [facility] d/c date is: whenever [facility] can take pt back   Subjective and Interval History:  New complaint today.  Normal oral intake.   Objective: Vitals:   07/30/22 0444 07/30/22 0814 07/30/22 1143 07/30/22 1544  BP: 116/62 (!) 112/55 (!) 118/59 108/71  Pulse: 76 70 88 86  Resp: 16 17 18 18   Temp:  99.1 F (37.3 C) 98.2 F (36.8 C) 98.1 F (36.7 C) 98.4 F (36.9 C)  TempSrc: Oral  Oral Oral  SpO2: 100% 100% 100% 100%  Weight:      Height:        Intake/Output Summary (Last 24 hours) at 07/30/2022 1903 Last data filed at 07/30/2022 1850 Gross per 24 hour  Intake 360 ml  Output 2150 ml  Net -1790 ml   Filed Weights   07/25/22 2023 07/28/22 0332  Weight: 71.1 kg 73.6 kg    Examination:   Constitutional: NAD, alert HEENT: conjunctivae and lids normal, EOMI CV: No cyanosis.   RESP: normal respiratory effort, on RA Neuro: II - XII grossly intact.   Psych: Normal mood and affect.     Data Reviewed: I have personally reviewed labs and imaging studies  Time spent: 35 minutes  Enzo Bi, MD [facility] If 7PM-7AM, please contact night-coverage 07/30/2022, 7:03 PM

## 2022-07-30 NOTE — Progress Notes (Signed)
Nutrition Follow-up  DOCUMENTATION CODES:   Non-severe (moderate) malnutrition in context of chronic illness  INTERVENTION:   -Continue dysphagia 3 diet -Continue Ensure Enlive po TID, each supplement provides 350 kcal and 20 grams of protein -Continue Magic cup TID with meals, each supplement provides 290 kcal and 9 grams of protein  -Continue MVI with minerals daily  NUTRITION DIAGNOSIS:   Moderate Malnutrition related to chronic illness as evidenced by moderate fat depletion, moderate muscle depletion.  Ongoing  GOAL:   Patient will meet greater than or equal to 90% of their needs  Progressing   MONITOR:   PO intake, Supplement acceptance, Labs, Weight trends, Skin, I & O's  REASON FOR ASSESSMENT:   Malnutrition Screening Tool    ASSESSMENT:   88 y.o. year old male with PMH CVA, dementia, CAD, CKD III, seizures, HTN, NSTEMI and tobacco use who resides at [facility] and is followed by hospice who is admitted with sepsis, UTI and fecal impaction.  Reviewed I/O's: -1.7 L x 24 hours and -2.8 L since admission  UOP: 2.4 L x 24 hours  Pt with variable oral intake. Noted meal completions 20-90%. Pt does not like hospital food. He is drinking Ensure supplements.   Per palliative care notes, no plan to transition to comfort care at this time, however, will discharge back to SNF with hospice services.   Medications reviewed and include senokot.   Labs reviewed: CBGS: 93-195 (inpatient orders for glycemic control are none).    Diet Order:   Diet Order             DIET DYS 3 Room service appropriate? No; Fluid consistency: Thin  Diet effective now                   EDUCATION NEEDS:   No education needs have been identified at this time  Skin:  Skin Assessment: Reviewed RN Assessment  Last BM:  07/30/22 (type 6)  Height:   Ht Readings from Last 1 Encounters:  07/25/22 5\' 10"  (1.778 m)    Weight:   Wt Readings from Last 1 Encounters:  07/28/22  73.6 kg    Ideal Body Weight:  75.45 kg  BMI:  Body mass index is 23.28 kg/m.  Estimated Nutritional Needs:   Kcal:  1800-2100kcal/day  Protein:  90-105g/day  Fluid:  1.8-2.1L/day    Loistine Chance, RD, LDN, [place] Registered Dietitian II Certified Diabetes Care and Education Specialist Please refer to AMION for RD and/or RD on-call/weekend/after hours pager

## 2022-07-30 NOTE — TOC Progression Note (Signed)
[facility]) - Progression Note    Patient Details  Name: Jens Siems MRN: [id number] Date of Birth: Jun 21, 1933  Transition of Care [facility]) CM/SW [place], [place] Phone Number: 07/30/2022, 12:27 PM  Clinical Narrative:     CSW called Anderson Malta with [facility], received verbally aggressive language from her, stating she was on vacation and to not call her. CSW attempted to inform her of this patient's return. She reports that this CSW will have to call main number, main number was called with no answer and went to vm that was full.   CSW informed Anderson Malta, she told this CSW to call back and ask for Zigmund Daniel and keep trying to call.   CSW called back to [facility] main number at [phone]. Secretary reports Zigmund Daniel has stepped out and will call this CSW back when she can.   At this time patient's discharge is delayed due to lack of communication with [facility]. CSW has escalated this to [facility] supervisor  Expected Discharge Plan: Assisted Living Barriers to Discharge: Continued Medical Work up  Expected Discharge Plan and Services Expected Discharge Plan: Assisted Living       Living arrangements for the past 2 months: Assisted Living Facility                                       Social Determinants of Health (SDOH) Interventions    Readmission Risk Interventions     No data to display

## 2022-07-31 DIAGNOSIS — N3001 Acute cystitis with hematuria: Secondary | ICD-10-CM | POA: Diagnosis not present

## 2022-07-31 LAB — CBC
HCT: 25.7 % — ABNORMAL LOW (ref 39.0–52.0)
Hemoglobin: 8.4 g/dL — ABNORMAL LOW (ref 13.0–17.0)
MCH: 28.6 pg (ref 26.0–34.0)
MCHC: 32.7 g/dL (ref 30.0–36.0)
MCV: 87.4 fL (ref 80.0–100.0)
Platelets: 237 10*3/uL (ref 150–400)
RBC: 2.94 MIL/uL — ABNORMAL LOW (ref 4.22–5.81)
RDW: 17.2 % — ABNORMAL HIGH (ref 11.5–15.5)
WBC: 10.2 10*3/uL (ref 4.0–10.5)
nRBC: 0 % (ref 0.0–0.2)

## 2022-07-31 LAB — BASIC METABOLIC PANEL
Anion gap: 11 (ref 5–15)
BUN: 25 mg/dL — ABNORMAL HIGH (ref 8–23)
CO2: 22 mmol/L (ref 22–32)
Calcium: 8 mg/dL — ABNORMAL LOW (ref 8.9–10.3)
Chloride: 105 mmol/L (ref 98–111)
Creatinine, Ser: 1.17 mg/dL (ref 0.61–1.24)
GFR, Estimated: >60 mL/min (ref >60)
Glucose, Bld: 97 mg/dL (ref 70–99)
Potassium: 3.8 mmol/L (ref 3.5–5.1)
Sodium: 138 mmol/L (ref 135–145)

## 2022-07-31 LAB — GLUCOSE, CAPILLARY
Glucose-Capillary: 84 mg/dL (ref 70–99)
Glucose-Capillary: 179 mg/dL — ABNORMAL HIGH (ref 70–99)
Glucose-Capillary: 109 mg/dL — ABNORMAL HIGH (ref 70–99)

## 2022-07-31 LAB — CULTURE, BLOOD (SINGLE)
Culture: NO GROWTH
Special Requests: ADEQUATE

## 2022-07-31 LAB — MAGNESIUM: Magnesium: 2.1 mg/dL (ref 1.7–2.4)

## 2022-07-31 MED ORDER — NITROFURANTOIN MONOHYD MACRO 100 MG PO CAPS
100.0000 mg | ORAL_CAPSULE | Freq: Two times a day (BID) | ORAL | Status: DC
  Administered 2022-07-31 – 2022-08-03 (×7): 100 mg via ORAL
  Filled 2022-07-31 (×7): qty 1

## 2022-07-31 MED ORDER — NITROFURANTOIN MONOHYD MACRO 100 MG PO CAPS: 100.0000 mg | ORAL_CAPSULE | Freq: Two times a day (BID) | ORAL | 0 refills | Status: AC

## 2022-07-31 NOTE — TOC Progression Note (Signed)
Transition of Care [facility]) - Progression Note    Patient Details  Name: Ronald Matthews MRN: [id number] Date of Birth: May 02, 1933  Transition of Care [facility]) CM/SW [place], LCSW Phone Number: 07/31/2022, 9:58 AM  Clinical Narrative:    Call to [facility], spoke to [place] to ask if patient can come back with continued hospice care through [place].  Kay requested FL2 and DC summary be faxed to her at [phone] and stated she will call CSW back when she receives this. Zigmund Daniel stated she will need to review this information prior to patient coming back.    Expected Discharge Plan: Assisted Living Barriers to Discharge: No Barriers Identified  Expected Discharge Plan and Services Expected Discharge Plan: Assisted Living       Living arrangements for the past 2 months: Assisted Living Facility                                       Social Determinants of Health (SDOH) Interventions    Readmission Risk Interventions     No data to display

## 2022-07-31 NOTE — Progress Notes (Signed)
PROGRESS NOTE    Ronald Matthews  [id number] DOB: Oct 10, 1933 DOA: 07/25/2022 PCP: Bonnita Nasuti, MD  252A/252A-AA  LOS: 6 days   Brief hospital course:   Assessment & Plan: "Ronald Matthews is a 88 y.o. male with medical history significant of stroke, dementia, h/o NSTEMI in July, CAD, HTN, glaucoma who presents for blood clots, blood found in adult diaper, initially this was thought to be GI bleeding, however, when checked stool is brown.  Patient has suprapubic pain and frank blood from his penis.  He reports no trauma at his ALF.  He has not had this before.   * Acute cystitis with hematuria Urology following.  CT hematuria workup reviewed, showing some clot in the bladder and quite a bit of air in bladder and kidneys, likely from infection. No obvious mass or other finding that would require surgical intervention.  --urine cx pos for ESBL E coli.  Received 5 days of zosyn Plan: --transition to [facility] today for 5 more days.  ABLA (acute blood loss anemia) Due to gross hematuria.  Hbg initially 13 (suspect falsely high due to hemoconcentration) given recent baseline Hbg was 9 in July. Hbg dropped to 5.8 on morning following admission.  Received total 3 units PRBC Plan: --monitor Hgb and transfuse to keep Hgb >7  Severe sepsis (HCC) Due to UTI.  Meets sepsis criteria admission with leukocytosis, tachycardia, UA with infection.  Lactic acidosis consistent with organ dysfunction and severe sepsis.  NSTEMI (non-ST elevated myocardial infarction) (HCC) Troponin trend: 48 >> 1652 >> 2680...[id number]. Pt has no chest pain and EKG non-acute.   Pt also had NSTEMI prior admission in July with troponin peak > 9k.  Medical management was recommended after discussion with cardiology and patient's family.  He is not a candidate for invasive intervention.  He is followed by hospice. --Heparin contraindicated with active bleeding and anemia requiring transfusions Plan: --Stat EKG if chest  pain     Lactic acidosis Due to combination of hypovolemia with bleeding, and infection/UTI. Resolved.  CAD (coronary artery disease) Stable.  Prior hospital admission with NSTEMI. Hold aspirin with bleeding and acute anemia.  Gross hematuria Urology consulted. Likely hemorrhagic cystitis. Mgmt as outlined.  Fecal impaction ([place]) CT done for hematuria work up showed rectum distended with stool. --bowel regimen  Dementia ([place]) No behavioral issues noted. --Delirium precautions Patient is followed by hospice at ALF.  His sister is H POA and declines transition to comfort measures in the setting of severe anemia and recurrent non-STEMI. -- Palliative care consulted  Glaucoma Continue eye drop regimen.  History of seizure No acute issues. --Continue Keppra  History of supraventricular tachycardia --Continue metoprolol.  Malnutrition of moderate degree Moderate Malnutrition related to chronic illness as evidenced by moderate fat depletion, moderate muscle depletion. --supplements per dietician  Chronic kidney disease, stage 3a ([place]) Baseline Cr isn't clear, but pt likely has CKD-3a.  Cr on admission was 1.60, down from 4.01 in July (when he had septic shock).  Prior baseline was 2 years ago (Cr 0.90). --Monitor BMP --Renally dose meds and avoid nephrotoxins   DVT prophylaxis: SCD/Compression stockings Code Status: DNR  Family Communication:  Level of care: Progressive Dispo:   The patient is from: [facility] Anticipated d/c is to: [facility] d/c date is: whenever [facility] can take pt back   Subjective and Interval History:  Urine appeared free of blood.   [facility] refused to accept pt back until pt re-establishes with outpatient hospice.  Objective: Vitals:   07/31/22 0754 07/31/22 1144 07/31/22 1628 07/31/22 1649  BP: 108/65 (!) 102/52 108/67   Pulse: 73 82 79   Resp: 17 18 16    Temp: 98.2 F (36.8 C) 98.1 F (36.7  C) 99.2 F (37.3 C) 99 F (37.2 C)  TempSrc:  Oral  Oral  SpO2: 100% 100% 100%   Weight:      Height:        Intake/Output Summary (Last 24 hours) at 07/31/2022 1909 Last data filed at 07/31/2022 1810 Gross per 24 hour  Intake 369.85 ml  Output 500 ml  Net -130.15 ml   Filed Weights   07/25/22 2023 07/28/22 0332 07/31/22 0340  Weight: 71.1 kg 73.6 kg 76.7 kg    Examination:   Constitutional: NAD, alert HEENT: conjunctivae and lids normal, EOMI CV: No cyanosis.   RESP: normal respiratory effort, on RA Neuro: II - XII grossly intact.   Psych: Normal mood and affect.     Data Reviewed: I have personally reviewed labs and imaging studies  Time spent: 25 minutes  Ronald Bi, MD [facility] If 7PM-7AM, please contact night-coverage 07/31/2022, 7:09 PM

## 2022-07-31 NOTE — Care Management Important Message (Signed)
Important Message  Patient Details  Name: Ronald Matthews MRN: [id number] Date of Birth: 22-Apr-1933   Medicare Important Message Given:  Yes  Reviewed Medicare IM with Hetty Ely, sister, at [phone].  Copy of Medicare IM mailed to home address on file (sister's address).     Dannette Barbara 07/31/2022, 1:38 PM

## 2022-07-31 NOTE — Discharge Summary (Addendum)
Physician Discharge Summary   Ronald Matthews  male DOB: [birth year]  BZJ:[id number]  PCP: Bonnita Nasuti, MD  Admit date: 07/25/2022 Discharge date: 07/31/2022  Admitted From: [facility] Disposition:  [place]: DNR  Discharge Instructions     Ambulatory referral to Hospice   Complete by: As directed    evaluate and admit   Diet: Dys 3, Mech soft diet   Hospital Course:  For full details, please see H&P, progress notes, consult notes and ancillary notes.  Briefly,  Ronald Matthews is a 88 y.o. male with medical history significant of stroke, dementia, NSTEMI, CAD, HTN, glaucoma who presented for blood clots, blood found in adult diaper.  Patient has suprapubic pain and frank blood from his penis.  He reports no trauma at his ALF.  He has not had this before.   * Acute cystitis with gross hematuria CT hematuria workup showing some clot in the bladder and quite a bit of air in bladder and kidneys, likely from infection.  Urology consulted.  No obvious mass or other finding that would require surgical intervention.  --urine cx pos for ESBL E coli --Pt received 5 days of zosyn, and is discharged on 5 more days of Macrobid for total 10-day course   ABLA (acute blood loss anemia) Due to gross hematuria.  Hbg initially 13, suspect falsely high due to hemoconcentration given recent baseline Hbg was 9 in July. Hbg dropped to 5.8 on morning following admission.  Received total 3 units PRBC during this hospitalization. --Hematuria resolved, and Hgb stable around 8's prior to discharge.   Severe sepsis (HCC) Due to UTI.  Met sepsis criteria admission with leukocytosis, tachycardia, UA with infection.  Lactic acidosis consistent with organ dysfunction and severe sepsis.   NSTEMI (non-ST elevated myocardial infarction) (HCC) Troponin trend: 27 on presentation, peaked at 12,000.   Pt has no chest pain and EKG non-acute.   Pt also had NSTEMI during prior admission in July  with troponin peak > 9k.  Medical management was recommended after discussion with cardiology and patient's family.  He is not a candidate for invasive intervention.   --Heparin contraindicated with active bleeding and anemia requiring transfusions   Lactic acidosis, resolved Due to combination of hypovolemia with bleeding, and infection/UTI.   CAD (coronary artery disease) Resume ASA after discharge.  Fecal impaction ([place]) CT done for hematuria work up showed rectum distended with stool. --had BM after presentation.   Dementia ([place]) No behavioral issues noted. Patient is followed by hospice at ALF.     Glaucoma Continue home eye drop regimen.   History of seizure No acute issues. --Continue Keppra   History of supraventricular tachycardia --Continue metoprolol.   Malnutrition of moderate degree Moderate Malnutrition related to chronic illness as evidenced by moderate fat depletion, moderate muscle depletion. --supplements per dietician   CKD 2a  Chronic kidney disease 3a, ruled out  --Cr 1.1 and GFR >60 prior to discharge.  Can not determine if pt has AKI.    Discharge Diagnoses:  Principal Problem:   Acute cystitis with hematuria Active Problems:   ABLA (acute blood loss anemia)   Severe sepsis (HCC)   NSTEMI (non-ST elevated myocardial infarction) (HCC)   CAD (coronary artery disease)   Lactic acidosis   Gross hematuria   Dementia (HCC)   Fecal impaction (HCC)   History of seizure   Glaucoma   History of supraventricular tachycardia   Chronic kidney disease, stage 3a ([place])   Malnutrition of  moderate degree   30 Day Unplanned Readmission Risk Score    Flowsheet Row ED to Hosp-Admission (Current) from 07/25/2022 in [facility]U  30 Day Unplanned Readmission Risk Score (%) 20.1 Filed at 07/31/2022 0801       This score is the patient's risk of an unplanned readmission within 30 days of being discharged (0 -100%). The score is based on  dignosis, age, lab data, medications, orders, and past utilization.   Low:  0-14.9   Medium: 15-21.9   High: 22-29.9   Extreme: 30 and above         Discharge Instructions:  Allergies as of 07/31/2022       Reactions   Cephalexin Other (See Comments)   Ppd [tuberculin Purified Protein Derivative] Other (See Comments)   "Allergic," per [facility]   Tuberculin Tests Other (See Comments)   "Allergic," per [facility]        Medication List     TAKE these medications    acetaminophen 325 MG tablet Commonly known as: TYLENOL Take 2 tablets (650 mg total) by mouth every 4 (four) hours as needed for mild pain (or temp > 37.5 C (99.5 F)). What changed:  when to take this reasons to take this additional instructions   aspirin 81 MG chewable tablet Chew 81 mg by mouth in the morning.   Aveeno Eczema Therapy 1 % Crea Generic drug: Colloidal Oatmeal Apply 1 application topically See admin instructions. Apply to affected areas 2 times a day   baclofen 10 MG tablet Commonly known as: LIORESAL Take 10 mg by mouth 3 (three) times daily.   brimonidine 0.2 % ophthalmic solution Commonly known as: ALPHAGAN Place 1 drop into both eyes 2 (two) times daily.   brinzolamide 1 % ophthalmic suspension Commonly known as: AZOPT Place 1 drop into both eyes in the morning and at bedtime.   feeding supplement (PRO-STAT SUGAR FREE 64) Liqd Take 30 mLs by mouth daily. CHERRY   hydrocortisone cream 1 % Apply 1 application topically 2 (two) times daily.   ipratropium-albuterol 0.5-2.5 (3) MG/3ML Soln Commonly known as: DUONEB Take 3 mLs by nebulization every 4 (four) hours as needed (Shortness of Breath or Wheezing).   lactose free nutrition Liqd Take 237 mLs by mouth 3 (three) times daily. VANILLA   levETIRAcetam 500 MG tablet Commonly known as: KEPPRA Take 1 tablet (500 mg total) by mouth 2 (two) times daily.   loperamide 2 MG tablet Commonly known as: IMODIUM A-D Take 2 mg by mouth as needed  (after each loose stool- up to 4 doses in 12 hours and call MD if this diarrhea persists longer than 12 hours or is accompanied by severe abdominal pain).   Melatonin 10 MG Tabs Take 10 mg by mouth at bedtime as needed.   metoprolol succinate 25 MG 24 hr tablet Commonly known as: TOPROL-XL Take 12.5 mg by mouth daily.   Minerin Creme Crea Apply 1 Application topically in the morning and at bedtime.   multivitamins ther. w/minerals Tabs tablet Take 1 tablet by mouth daily.   Mylanta 200-200-20 MG/5ML suspension Generic drug: alum & mag hydroxide-simeth Take 30 mLs by mouth once as needed for indigestion or heartburn.   nitrofurantoin (macrocrystal-monohydrate) 100 MG capsule Commonly known as: MACROBID Take 1 capsule (100 mg total) by mouth every 12 (twelve) hours for 5 days.   nitroGLYCERIN 0.4 MG SL tablet Commonly known as: NITROSTAT Place 0.4 mg under the tongue every 5 (five) minutes x 3  doses as needed for chest pain (AND CALL EMS IF NO RESOLUTION).   ondansetron 4 MG disintegrating tablet Commonly known as: ZOFRAN-ODT Take 1 tablet (4 mg total) by mouth every 6 (six) hours as needed for nausea.   Pepto-Bismol 262 MG/15ML suspension Generic drug: bismuth subsalicylate Take 15 mLs by mouth every 2 (two) hours as needed for indigestion or diarrhea or loose stools (max of 6 doses in 24 hours).   senna-docusate 8.6-50 MG tablet Commonly known as: Senokot-S Take 1 tablet by mouth at bedtime as needed for mild constipation.   Travoprost (BAK Free) 0.004 % Soln ophthalmic solution Commonly known as: TRAVATAN Place 1 drop into both eyes at bedtime.   traZODone 50 MG tablet Commonly known as: DESYREL Take 50 mg by mouth at bedtime.   zinc oxide 20 % ointment Apply 1 application  topically See admin instructions. Apply topically to sacrum twice daily         Follow-up Information     H[place], Rosalyn Charters, MD Follow up in 1 week(s).   Specialty: Internal Medicine Contact  information: [address] [place] [address] [phone]                 Allergies  Allergen Reactions   Cephalexin Other (See Comments)   Ppd [Tuberculin Purified Protein Derivative] Other (See Comments)    "Allergic," per [facility]   Tuberculin Tests Other (See Comments)    "Allergic," per [facility]     The results of significant diagnostics from this hospitalization (including imaging, microbiology, ancillary and laboratory) are listed below for reference.   Consultations:   Procedures/Studies: DG Chest Port 1 View  Result Date: 07/26/2022 CLINICAL DATA:  Shortness of breath EXAM: PORTABLE CHEST 1 VIEW COMPARISON:  05/21/2022 and prior studies FINDINGS: The cardiomediastinal silhouette is unchanged. There is no evidence of focal airspace disease, pulmonary edema, suspicious pulmonary nodule/mass, pleural effusion, or pneumothorax. A calcified granuloma within the LOWER RIGHT lung again noted. No acute bony abnormalities are identified. IMPRESSION: No active disease. Electronically Signed   By: Margarette Canada M.D.   On: 07/26/2022 16:12   CT HEMATURIA WORKUP  Result Date: 07/26/2022 CLINICAL DATA:  Gross hematuria. Lower abdominal and pelvic pain. Nausea and vomiting. EXAM: CT ABDOMEN AND PELVIS WITHOUT AND WITH CONTRAST TECHNIQUE: Multidetector CT imaging of the abdomen and pelvis was performed following the standard protocol before and following the bolus administration of intravenous contrast. RADIATION DOSE REDUCTION: This exam was performed according to the departmental dose-optimization program which includes automated exposure control, adjustment of the mA and/or kV according to patient size and/or use of iterative reconstruction technique. CONTRAST:  162m OMNIPAQUE IOHEXOL 300 MG/ML  SOLN COMPARISON:  None Available. FINDINGS: Lower Chest: Bilateral lower lobe atelectasis versus scarring. Tiny bilateral pleural effusions versus pleural thickening. Aortic and coronary  atherosclerotic calcification incidentally noted. Hepatobiliary: No hepatic masses identified. Multiple small less than 1 cm calcified gallstones are seen, however there is no evidence of cholecystitis or biliary ductal dilatation. Pancreas:  No mass or inflammatory changes. Spleen: Within normal limits in size and appearance. Adrenals/Urinary Tract: No adrenal masses identified. No evidence of urolithiasis. No suspicious renal masses identified. Moderate hydroureteronephrosis is seen bilaterally to the level of the urinary bladder. No evidence of ureteral calculi or other obstructing etiology. Air is also noted throughout the renal collecting systems, ureters, and bladder. These findings are likely due to recent instrumentation and vesicoureteral reflux. Moderate blood clot is seen within the urinary bladder, however no definite  soft tissue masses identified. Stomach/Bowel: A moderate right inguinal hernia is seen which contains the terminal ileum and cecum. Normal appendix visualized. No evidence of obstruction, inflammatory process or abnormal fluid collections. Marked distention of the rectum by stool is seen, suspicious for fecal impaction. Vascular/Lymphatic: No pathologically enlarged lymph nodes. No acute vascular findings. Aortic atherosclerotic calcification incidentally noted. Reproductive:  No mass or other significant abnormality. Other:  None. Musculoskeletal:  No suspicious bone lesions identified. IMPRESSION: Moderate bilateral hydroureteronephrosis to the level of the urinary bladder. No ureteral calculi or other obstructing etiology visualized. Air throughout renal collecting systems, ureters, and bladder, likely due to recent instrumentation and vesicoureteral reflux. Moderate blood clot within urinary bladder. No definite bladder mass identified. Recommend cystoscopy for further evaluation. Moderate right inguinal hernia containing terminal ileum and cecum. No evidence of bowel obstruction.  Marked distention of the rectum by stool, suspicious for fecal impaction. Cholelithiasis. No radiographic evidence of cholecystitis. Bilateral lower lobe atelectasis versus scarring, and tiny bilateral pleural effusions versus pleural thickening. Aortic Atherosclerosis (ICD10-I70.0). Electronically Signed   By: Marlaine Hind M.D.   On: 07/26/2022 12:42      Labs: BNP (last 3 results) No results for input(s): "BNP" in the last 8760 hours. Basic Metabolic Panel: Recent Labs  Lab 07/27/22 0654 07/28/22 0945 07/29/22 0504 07/30/22 0610 07/31/22 0516  NA 140 140 142 139 138  K 4.3 3.5 3.7 3.9 3.8  CL 115* 112* 114* 112* 105  CO2 20* 21* 22 21* 22  GLUCOSE 98 110* 89 92 97  BUN 32* 29* 27* 24* 25*  CREATININE 1.51* 1.38* 1.32* 1.11 1.17  CALCIUM 7.8* 7.7* 8.0* 7.9* 8.0*  MG 2.2 2.2  --  2.0 2.1   Liver Function Tests: Recent Labs  Lab 07/25/22 2115 07/27/22 0654  AST 22 99*  ALT 11 23  ALKPHOS 75 56  BILITOT 0.5 0.7  PROT 7.4 6.2*  ALBUMIN 3.1* 2.6*   No results for input(s): "LIPASE", "AMYLASE" in the last 168 hours. No results for input(s): "AMMONIA" in the last 168 hours. CBC: Recent Labs  Lab 07/25/22 2115 07/26/22 0610 07/26/22 1007 07/26/22 1941 07/27/22 0509 07/27/22 1139 07/28/22 0945 07/28/22 1926 07/29/22 0504 07/30/22 0726 07/31/22 0516  WBC 6.6   < > 12.1*  --  12.0*  --  10.2  --  10.1 7.9 10.2  NEUTROABS 5.5  --  9.7*  --   --   --   --   --   --   --   --   HGB 13.1   < > 5.8*   < > 7.0*   < > 6.8* 6.7* 8.1* 8.4* 8.4*  HCT 42.7   < > 18.9*   < > 21.3*   < > 21.2* 21.2* 24.8* 25.7* 25.7*  MCV 86.3   < > 87.5  --  85.2  --  85.8  --  87.3 86.8 87.4  PLT 204   < > 241  --  262  --  229  --  263 256 237   < > = values in this interval not displayed.   Cardiac Enzymes: No results for input(s): "CKTOTAL", "CKMB", "CKMBINDEX", "TROPONINI" in the last 168 hours. BNP: Invalid input(s): "POCBNP" CBG: Recent Labs  Lab 07/29/22 1545 07/30/22 0811  07/30/22 1140 07/30/22 1549 07/31/22 0836  GLUCAP 146* 93 195* 147* 84   D-Dimer No results for input(s): "DDIMER" in the last 72 hours. Hgb A1c No results for input(s): "HGBA1C" in the last  72 hours. Lipid Profile No results for input(s): "CHOL", "HDL", "LDLCALC", "TRIG", "CHOLHDL", "LDLDIRECT" in the last 72 hours. Thyroid function studies No results for input(s): "TSH", "T4TOTAL", "T3FREE", "THYROIDAB" in the last 72 hours.  Invalid input(s): "FREET3" Anemia work up No results for input(s): "VITAMINB12", "FOLATE", "FERRITIN", "TIBC", "IRON", "RETICCTPCT" in the last 72 hours. Urinalysis    Component Value Date/Time   COLORURINE RED (A) 07/26/2022 1007   APPEARANCEUR CLOUDY (A) 07/26/2022 1007   LABSPEC 1.030 07/26/2022 1007   PHURINE  07/26/2022 1007    TEST NOT REPORTED DUE TO COLOR INTERFERENCE OF URINE PIGMENT   GLUCOSEU (A) 07/26/2022 1007    TEST NOT REPORTED DUE TO COLOR INTERFERENCE OF URINE PIGMENT   HGBUR (A) 07/26/2022 1007    TEST NOT REPORTED DUE TO COLOR INTERFERENCE OF URINE PIGMENT   BILIRUBINUR (A) 07/26/2022 1007    TEST NOT REPORTED DUE TO COLOR INTERFERENCE OF URINE PIGMENT   KETONESUR (A) 07/26/2022 1007    TEST NOT REPORTED DUE TO COLOR INTERFERENCE OF URINE PIGMENT   PROTEINUR (A) 07/26/2022 1007    TEST NOT REPORTED DUE TO COLOR INTERFERENCE OF URINE PIGMENT   NITRITE (A) 07/26/2022 1007    TEST NOT REPORTED DUE TO COLOR INTERFERENCE OF URINE PIGMENT   LEUKOCYTESUR (A) 07/26/2022 1007    TEST NOT REPORTED DUE TO COLOR INTERFERENCE OF URINE PIGMENT   Sepsis Labs Recent Labs  Lab 07/28/22 0945 07/29/22 0504 07/30/22 0726 07/31/22 0516  WBC 10.2 10.1 7.9 10.2   Microbiology Recent Results (from the past 240 hour(s))  Culture, blood (single)     Status: None   Collection Time: 07/26/22 12:31 AM   Specimen: BLOOD  Result Value Ref Range Status   Specimen Description BLOOD BLOOD LEFT FOREARM  Final   Special Requests   Final    BOTTLES  DRAWN AEROBIC AND ANAEROBIC Blood Culture adequate volume   Culture   Final    NO GROWTH 5 DAYS Performed at [facility], [address], [place], [place] [address]    Report Status 07/31/2022 FINAL  Final  Urine Culture     Status: Abnormal   Collection Time: 07/26/22 10:07 AM   Specimen: Urine, Clean Catch  Result Value Ref Range Status   Specimen Description   Final    URINE, CLEAN CATCH Performed at [facility], [address], [place], [place] [address]    Special Requests   Final    NONE Performed at [facility], [address] [place], [place] [address]    Culture (A)  Final    >=100,000 COLONIES/mL ESCHERICHIA COLI Confirmed Extended Spectrum Beta-Lactamase Producer (ESBL).  In bloodstream infections from ESBL organisms, carbapenems are preferred over piperacillin/tazobactam. They are shown to have a lower risk of mortality.    Report Status 07/30/2022 FINAL  Final   Organism ID, Bacteria ESCHERICHIA COLI (A)  Final      Susceptibility   Escherichia coli - MIC*    AMPICILLIN >=32 RESISTANT Resistant     CEFAZOLIN >=64 RESISTANT Resistant     CEFEPIME 4 INTERMEDIATE Intermediate     CEFTRIAXONE >=64 RESISTANT Resistant     CIPROFLOXACIN >=4 RESISTANT Resistant     GENTAMICIN <=1 SENSITIVE Sensitive     IMIPENEM <=0.25 SENSITIVE Sensitive     NITROFURANTOIN <=16 SENSITIVE Sensitive     TRIMETH/SULFA <=20 SENSITIVE Sensitive     AMPICILLIN/SULBACTAM 16 INTERMEDIATE Intermediate     PIP/TAZO <=4 SENSITIVE Sensitive     * >=  100,000 COLONIES/mL ESCHERICHIA COLI  MRSA Next Gen by PCR, Nasal     Status: Abnormal   Collection Time: 07/26/22  1:17 PM   Specimen: Nasal Mucosa; Nasal Swab  Result Value Ref Range Status   MRSA by PCR Next Gen DETECTED (A) NOT DETECTED Final    Comment: RESULT CALLED TO, READ BACK BY AND VERIFIED WITH: C/UTE JACOBS 07/26/22 1615 SLM (NOTE) The GeneXpert MRSA Assay (FDA approved for NASAL specimens only), is  one component of a comprehensive MRSA colonization surveillance program. It is not intended to diagnose MRSA infection nor to guide or monitor treatment for MRSA infections. Test performance is not FDA approved in patients less than 71 years old. Performed at [facility], [address] [place], [place] [address]      Total time spend on discharging this patient, including the last patient exam, discussing the hospital stay, instructions for ongoing care as it relates to all pertinent caregivers, as well as preparing the medical discharge records, prescriptions, and/or referrals as applicable, is 35 minutes.    Enzo Bi, MD  [facility] 07/31/2022, 11:18 AM

## 2022-07-31 NOTE — NC FL2 (Signed)
[place] LEVEL OF CARE SCREENING TOOL     IDENTIFICATION  Patient Name: Ronald Matthews Birthdate: 12-04-1932 Sex: male Admission Date (Current Location): 07/25/2022  [facility] and [place] Number:  Engineering geologist and Address:  [facility], [address] [place], [place] [address]      Provider Number: [id number]  Attending Physician Name and Address:  Enzo Bi, MD  Relative Name and Phone Number:  Burman Nieves (sister) [phone]    Current Level of Care: Hospital Recommended Level of Care: [facility]) Prior Approval Number:    Date Approved/Denied:   PASRR Number:    Discharge Plan: Other (Comment) Ambulatory Care Center)    Current Diagnoses: Patient Active Problem List   Diagnosis Date Noted   Malnutrition of moderate degree 07/27/2022   ABLA (acute blood loss anemia) 07/26/2022   Fecal impaction ([place]) 07/26/2022   Glaucoma 07/26/2022   History of supraventricular tachycardia 07/26/2022   Chronic kidney disease, stage 3a ([place]) 07/26/2022   Severe sepsis ([place]) 07/26/2022   Gross hematuria 07/25/2022   AMS (altered mental status) 05/22/2022   Severe sepsis with septic shock ([place]) 05/22/2022   Community acquired pneumonia 05/22/2022   NSTEMI (non-ST elevated myocardial infarction) ([place]) 05/22/2022   Acute respiratory failure with hypoxia ([place]) 05/22/2022   Anemia 05/22/2022   Lactic acidosis 05/22/2022   AKI (acute kidney injury) ([place]) 05/22/2022   LOC (loss of consciousness) ([place]) 06/22/2020   History of seizure 06/22/2020   Episode of unresponsiveness    Acute cystitis with hematuria    Syncope 05/07/2020   Dehydration 17/51/0258   Acute metabolic encephalopathy 52/77/8242   Jaw pain    CVA (cerebral vascular accident) ([place]) 04/17/2019   Ischemic stroke ([place]) 07/15/2018   Hypertension 07/15/2018   Dementia ([place]) 07/15/2018   CAD (coronary artery disease) 07/15/2018    Orientation  RESPIRATION BLADDER Height & Weight     Self, Time, Situation, Place  Normal Incontinent Weight: 169 lb (76.7 kg) Height:  5\' 10"  (177.8 cm)  BEHAVIORAL SYMPTOMS/MOOD NEUROLOGICAL BOWEL NUTRITION STATUS      Incontinent Diet (dysphagia 3)  AMBULATORY STATUS COMMUNICATION OF NEEDS Skin   Limited Assist Verbally Normal                       Personal Care Assistance Level of Assistance  Bathing, Dressing, Total care, Feeding Bathing Assistance: Limited assistance Feeding assistance: Independent Dressing Assistance: Limited assistance Total Care Assistance: Limited assistance   Functional Limitations Info  Sight, Hearing, Speech Sight Info: Adequate Hearing Info: Impaired Speech Info: Adequate    SPECIAL CARE FACTORS FREQUENCY                       Contractures Contractures Info: Not present    Additional Factors Info  Code Status, Allergies Code Status Info: dnr Allergies Info: Cephalexin   Ppd (Tuberculin Purified Protein Derivative)   Tuberculin Tests           Current Medications (07/31/2022):  This is the current hospital active medication list Current Facility-Administered Medications  Medication Dose Route Frequency Provider Last Rate Last Admin   acetaminophen (TYLENOL) tablet 650 mg  650 mg Oral Q6H PRN Nicole Kindred A, DO       baclofen (LIORESAL) tablet 10 mg  10 mg Oral TID Sid Falcon, MD   10 mg at 07/31/22 0903   bisacodyl (DULCOLAX) suppository 10 mg  10 mg Rectal Daily PRN  Nicole Kindred A, DO       brimonidine (ALPHAGAN) 0.2 % ophthalmic solution 1 drop  1 drop Both Eyes BID Gilles Chiquito B, MD   1 drop at 07/31/22 0903   brinzolamide (AZOPT) 1 % ophthalmic suspension 1 drop  1 drop Both Eyes TID Gilles Chiquito B, MD   1 drop at 07/31/22 0903   feeding supplement (ENSURE ENLIVE / ENSURE PLUS) liquid 237 mL  237 mL Oral TID BM Nicole Kindred A, DO   237 mL at 07/31/22 0903   latanoprost (XALATAN) 0.005 % ophthalmic solution 1 drop  1 drop  Both Eyes QHS Gilles Chiquito B, MD   1 drop at 07/30/22 2328   levETIRAcetam (KEPPRA) tablet 500 mg  500 mg Oral BID Gilles Chiquito B, MD   500 mg at 07/31/22 [id number]   melatonin tablet 10 mg  10 mg Oral QHS PRN Sid Falcon, MD       metoprolol succinate (TOPROL-XL) 24 hr tablet 12.5 mg  12.5 mg Oral Daily Gilles Chiquito B, MD   12.5 mg at 07/31/22 [id number]   morphine (PF) 2 MG/ML injection 2 mg  2 mg Intravenous Q2H PRN Sid Falcon, MD       multivitamin with minerals tablet 1 tablet  1 tablet Oral Daily Nicole Kindred A, DO   1 tablet at 07/31/22 [id number]   nitrofurantoin (macrocrystal-monohydrate) (MACROBID) capsule 100 mg  100 mg Oral Q12H Enzo Bi, MD       nitroGLYCERIN (NITROSTAT) SL tablet 0.4 mg  0.4 mg Sublingual Q5 Min x 3 PRN Gilles Chiquito B, MD       ondansetron (ZOFRAN-ODT) disintegrating tablet 4 mg  4 mg Oral Q6H PRN Sid Falcon, MD       Oral care mouth rinse  15 mL Mouth Rinse 4 times per day Nicole Kindred A, DO   15 mL at 07/31/22 0914   Oral care mouth rinse  15 mL Mouth Rinse PRN Nicole Kindred A, DO       oxyCODONE (Oxy IR/ROXICODONE) immediate release tablet 5 mg  5 mg Oral Q4H PRN Sid Falcon, MD       senna-docusate (Senokot-S) tablet 1 tablet  1 tablet Oral BID Nicole Kindred A, DO   1 tablet at 07/30/22 2120   traZODone (DESYREL) tablet 50 mg  50 mg Oral QHS Sid Falcon, MD   50 mg at 07/30/22 2120     Discharge Medications: Please see discharge summary for a list of discharge medications.  Relevant Imaging Results:  Relevant Lab Results:   Additional Information SSN: [id number]  Alberteen Sam, LCSW

## 2022-08-01 DIAGNOSIS — N3001 Acute cystitis with hematuria: Secondary | ICD-10-CM | POA: Diagnosis not present

## 2022-08-01 LAB — CBC
HCT: 26.5 % — ABNORMAL LOW (ref 39.0–52.0)
Hemoglobin: 8.6 g/dL — ABNORMAL LOW (ref 13.0–17.0)
MCH: 28.5 pg (ref 26.0–34.0)
MCHC: 32.5 g/dL (ref 30.0–36.0)
MCV: 87.7 fL (ref 80.0–100.0)
Platelets: 258 10*3/uL (ref 150–400)
RBC: 3.02 MIL/uL — ABNORMAL LOW (ref 4.22–5.81)
RDW: 17.4 % — ABNORMAL HIGH (ref 11.5–15.5)
WBC: 9.7 10*3/uL (ref 4.0–10.5)
nRBC: 0 % (ref 0.0–0.2)

## 2022-08-01 LAB — BASIC METABOLIC PANEL
Anion gap: 7 (ref 5–15)
BUN: 27 mg/dL — ABNORMAL HIGH (ref 8–23)
CO2: 23 mmol/L (ref 22–32)
Calcium: 8.1 mg/dL — ABNORMAL LOW (ref 8.9–10.3)
Chloride: 107 mmol/L (ref 98–111)
Creatinine, Ser: 1.18 mg/dL (ref 0.61–1.24)
GFR, Estimated: 60 mL/min — ABNORMAL LOW (ref >60)
Glucose, Bld: 95 mg/dL (ref 70–99)
Potassium: 4.3 mmol/L (ref 3.5–5.1)
Sodium: 137 mmol/L (ref 135–145)

## 2022-08-01 LAB — GLUCOSE, CAPILLARY
Glucose-Capillary: 83 mg/dL (ref 70–99)
Glucose-Capillary: 120 mg/dL — ABNORMAL HIGH (ref 70–99)
Glucose-Capillary: 142 mg/dL — ABNORMAL HIGH (ref 70–99)
Glucose-Capillary: 112 mg/dL — ABNORMAL HIGH (ref 70–99)

## 2022-08-01 LAB — MAGNESIUM: Magnesium: 2.1 mg/dL (ref 1.7–2.4)

## 2022-08-01 NOTE — Progress Notes (Signed)
PROGRESS NOTE    Ronald Matthews  ZOX:[id number] DOB: February 18, 1933 DOA: 07/25/2022 PCP: Bonnita Nasuti, MD  252A/252A-AA  LOS: 7 days   Brief hospital course:   Assessment & Plan: "Ronald Matthews is a 88 y.o. male with medical history significant of stroke, dementia, h/o NSTEMI in July, CAD, HTN, glaucoma who presents for blood clots, blood found in adult diaper, initially this was thought to be GI bleeding, however, when checked stool is brown.  Patient has suprapubic pain and frank blood from his penis.  He reports no trauma at his ALF.  He has not had this before.   * Acute cystitis with hematuria Urology following.  CT hematuria workup reviewed, showing some clot in the bladder and quite a bit of air in bladder and kidneys, likely from infection. No obvious mass or other finding that would require surgical intervention.  --urine cx pos for ESBL E coli.  Received 5 days of zosyn then transitioned to [place]: --cont macrobid for 5 more days  ABLA (acute blood loss anemia) Due to gross hematuria.  Hbg initially 13 (suspect falsely high due to hemoconcentration) given recent baseline Hbg was 9 in July. Hbg dropped to 5.8 on morning following admission.  Received total 3 units PRBC Plan: --monitor Hgb and transfuse to keep Hgb >7  Severe sepsis (HCC) Due to UTI.  Meets sepsis criteria admission with leukocytosis, tachycardia, UA with infection.  Lactic acidosis consistent with organ dysfunction and severe sepsis.  NSTEMI (non-ST elevated myocardial infarction) (HCC) Troponin trend: 48 >> 1652 >> 2680...[id number]. Pt has no chest pain and EKG non-acute.   Pt also had NSTEMI prior admission in July with troponin peak > 9k.  Medical management was recommended after discussion with cardiology and patient's family.  He is not a candidate for invasive intervention.  He is followed by hospice. --Heparin contraindicated with active bleeding and anemia requiring transfusions Plan: --Stat EKG  if chest pain     Lactic acidosis Due to combination of hypovolemia with bleeding, and infection/UTI. Resolved.  CAD (coronary artery disease) Stable.  Prior hospital admission with NSTEMI. Hold aspirin with bleeding and acute anemia.  Gross hematuria Urology consulted. Likely hemorrhagic cystitis. Mgmt as outlined.  Fecal impaction ([place]) CT done for hematuria work up showed rectum distended with stool. --bowel regimen  Dementia ([place]) No behavioral issues noted. --Delirium precautions Patient is followed by hospice at ALF.  His sister is H POA and declines transition to comfort measures in the setting of severe anemia and recurrent non-STEMI. -- Palliative care consulted  Glaucoma Continue eye drop regimen.  History of seizure No acute issues. --Continue Keppra  History of supraventricular tachycardia --Continue metoprolol.  Malnutrition of moderate degree Moderate Malnutrition related to chronic illness as evidenced by moderate fat depletion, moderate muscle depletion. --supplements per dietician  Chronic kidney disease, stage 3a ([place]) Baseline Cr isn't clear, but pt likely has CKD-3a.  Cr on admission was 1.60, down from 4.01 in July (when he had septic shock).  Prior baseline was 2 years ago (Cr 0.90). --Monitor BMP --Renally dose meds and avoid nephrotoxins   DVT prophylaxis: SCD/Compression stockings Code Status: DNR  Family Communication:  Level of care: Progressive Dispo:   The patient is from: [facility] Anticipated d/c is to: [facility] d/c date is: whenever [facility] can take pt back   Subjective and Interval History:  No acute event or complaint.   Objective: Vitals:   08/01/22 0503 08/01/22 8119 08/01/22 1211 08/01/22 1512  BP: (!) 103/56 (!) 92/59 101/62 120/64  Pulse: 65 73 80 93  Resp: 16 16 16 16   Temp: 97.8 F (36.6 C) 98.5 F (36.9 C) 98.8 F (37.1 C) 99.1 F (37.3 C)  TempSrc: Oral     SpO2: 99% 100%  100% 100%  Weight:      Height:        Intake/Output Summary (Last 24 hours) at 08/01/2022 1718 Last data filed at 08/01/2022 1500 Gross per 24 hour  Intake 357 ml  Output 1485 ml  Net -1128 ml   Filed Weights   07/25/22 2023 07/28/22 0332 07/31/22 0340  Weight: 71.1 kg 73.6 kg 76.7 kg    Examination:   Constitutional: NAD CV: No cyanosis.   RESP: normal respiratory effort, on RA GU:  urine without blood   Data Reviewed: I have personally reviewed labs and imaging studies  Time spent: 25 minutes  Enzo Bi, MD [facility] If 7PM-7AM, please contact night-coverage 08/01/2022, 5:18 PM

## 2022-08-01 NOTE — Progress Notes (Addendum)
No IV access. Per Dr. Billie Ruddy, not to place another IV unless medically necessary.

## 2022-08-02 DIAGNOSIS — N3001 Acute cystitis with hematuria: Secondary | ICD-10-CM | POA: Diagnosis not present

## 2022-08-02 LAB — GLUCOSE, CAPILLARY: Glucose-Capillary: 93 mg/dL (ref 70–99)

## 2022-08-02 MED ORDER — ASPIRIN 81 MG PO CHEW
81.0000 mg | CHEWABLE_TABLET | Freq: Every morning | ORAL | Status: DC
  Administered 2022-08-02 – 2022-08-03 (×2): 81 mg via ORAL
  Filled 2022-08-02 (×2): qty 1

## 2022-08-02 NOTE — Progress Notes (Signed)
PROGRESS NOTE    Ronald Matthews  WIO:[id number] DOB: May 29, 1933 DOA: 07/25/2022 PCP: Bonnita Nasuti, MD  252A/252A-AA  LOS: 8 days   Brief hospital course:   Assessment & Plan: Ronald Matthews is a 88 y.o. male with medical history significant of stroke, dementia, NSTEMI, CAD, HTN, glaucoma who presented for blood clots, blood found in adult diaper.  Patient has suprapubic pain and frank blood from his penis.  He reports no trauma at his ALF.  He has not had this before.   * Acute cystitis with hematuria CT hematuria workup reviewed, showing some clot in the bladder and quite a bit of air in bladder and kidneys, likely from infection. No obvious mass or other finding that would require surgical intervention, per urology. --urine cx pos for ESBL E coli.  Received 5 days of zosyn then transitioned to [place]: --cont macrobid for a total 10-day course, last day 10/3  ABLA (acute blood loss anemia) Due to gross hematuria.  Hbg initially 13 (suspect falsely high due to hemoconcentration) given recent baseline Hbg was 9 in July. Hbg dropped to 5.8 on morning following admission.  Received total 3 units PRBC Plan: --monitor Hgb and transfuse to keep Hgb >7  Severe sepsis (HCC) Due to UTI.  Meets sepsis criteria admission with leukocytosis, tachycardia, UA with infection.  Lactic acidosis consistent with organ dysfunction and severe sepsis.  NSTEMI (non-ST elevated myocardial infarction) (HCC) Troponin trend: 48 >> 1652 >> 2680...[id number]. Pt has no chest pain and EKG non-acute.   Pt also had NSTEMI prior admission in July with troponin peak > 9k.  Medical management was recommended after discussion with cardiology and patient's family.  He is not a candidate for invasive intervention.  He is followed by hospice. --Heparin contraindicated with active bleeding and anemia requiring transfusions Plan: --Stat EKG if chest pain     Lactic acidosis Due to combination of hypovolemia with  bleeding, and infection/UTI. Resolved.  CAD (coronary artery disease) Stable.  Prior hospital admission with NSTEMI. --resume home ASA today  Gross hematuria Urology consulted. Likely hemorrhagic cystitis.  Hematuria resolved with treatment of UTI  Fecal impaction ([place]) CT done for hematuria work up showed rectum distended with stool. --bowel regimen  Dementia ([place]) No behavioral issues noted. --Delirium precautions Patient is followed by hospice at ALF.  His sister is H POA and declines transition to comfort measures in the setting of severe anemia and recurrent non-STEMI. -- Palliative care consulted  Glaucoma Continue eye drop regimen.  History of seizure No acute issues. --Continue Keppra  History of supraventricular tachycardia --Continue metoprolol.  Malnutrition of moderate degree Moderate Malnutrition related to chronic illness as evidenced by moderate fat depletion, moderate muscle depletion. --supplements per dietician  Chronic kidney disease, stage 3a ([place]) Baseline Cr isn't clear, but pt likely has CKD-3a.  Cr on admission was 1.60, down from 4.01 in July (when he had septic shock).  Prior baseline was 2 years ago (Cr 0.90). --Monitor BMP --Renally dose meds and avoid nephrotoxins   DVT prophylaxis: SCD/Compression stockings Code Status: DNR  Family Communication:  Level of care: Progressive Dispo:   The patient is from: [facility] Anticipated d/c is to: [facility] d/c date is: whenever [facility] can take pt back.  Pt was ready for discharge on 9/29, however, [facility] requires that pt be re-established with hospice care prior to returning.  Somehow, pt was discharged from hospice care when he presented to the hospital, and now needs  re-admission to hospice care, and hospice admitter can not see pt until Monday 10/2.   Subjective and Interval History:  No new complaint or issue.   Objective: Vitals:   08/02/22 0018  08/02/22 0500 08/02/22 0738 08/02/22 1128  BP: (!) 112/45 (!) 108/56 108/62 104/61  Pulse: (!) 44 83 91 85  Resp: 18 18 18 16   Temp: 100 F (37.8 C) 100 F (37.8 C) 98.4 F (36.9 C) 98.2 F (36.8 C)  TempSrc: Oral Oral    SpO2: 100% 100% 100% 100%  Weight:      Height:        Intake/Output Summary (Last 24 hours) at 08/02/2022 1520 Last data filed at 08/02/2022 1100 Gross per 24 hour  Intake --  Output 1400 ml  Net -1400 ml   Filed Weights   07/25/22 2023 07/28/22 0332 07/31/22 0340  Weight: 71.1 kg 73.6 kg 76.7 kg    Examination:   Constitutional: NAD, alert, oriented to self and hospital CV: No cyanosis.   RESP: normal respiratory effort, on RA Extremities: No effusions, edema in BLE SKIN: warm, dry Neuro: II - XII grossly intact.     Data Reviewed: I have personally reviewed labs and imaging studies  Time spent: 25 minutes  Enzo Bi, MD [facility] If 7PM-7AM, please contact night-coverage 08/02/2022, 3:20 PM

## 2022-08-03 DIAGNOSIS — N3001 Acute cystitis with hematuria: Secondary | ICD-10-CM | POA: Diagnosis not present

## 2022-08-03 LAB — GLUCOSE, CAPILLARY: Glucose-Capillary: 94 mg/dL (ref 70–99)

## 2022-08-03 NOTE — Discharge Summary (Signed)
Physician Discharge Summary   Ronald Matthews  male DOB: 1933/05/28  VOZ:[id number]  PCP: Bonnita Nasuti, MD  Admit date: 07/25/2022 Discharge date: 08/03/2022  Admitted From: [facility] Disposition:  [place]: DNR  Discharge Instructions     Ambulatory referral to Hospice   Complete by: As directed    evaluate and admit   Diet: Dys 3, Mech soft diet   Hospital Course:  For full details, please see H&P, progress notes, consult notes and ancillary notes.  Briefly,  Ronald Matthews is a 88 y.o. male with medical history significant of stroke, dementia, NSTEMI, CAD, HTN, glaucoma who presented for blood clots, blood found in adult diaper.  Patient has suprapubic pain and frank blood from his penis.  He reports no trauma at his ALF.  He has not had this before.   Acute cystitis with gross hematuria CT hematuria workup showing some clot in the bladder and quite a bit of air in bladder and kidneys, likely from infection.  Urology consulted.  No obvious mass or other finding that would require surgical intervention.  --urine cx pos for ESBL E coli --Pt received 5 days of zosyn, and is discharged on 5 more days of Macrobid for total 10-day course   ABLA (acute blood loss anemia) Due to gross hematuria.  Hbg initially 13, suspect falsely high due to hemoconcentration given recent baseline Hbg was 9 in July. Hbg dropped to 5.8 on morning following admission.  Received total 3 units PRBC during this hospitalization. --Hematuria resolved, and Hgb stable around 8's prior to discharge.   Severe sepsis (HCC) Due to UTI.  Met sepsis criteria admission with leukocytosis, tachycardia, UA with infection.  Lactic acidosis consistent with organ dysfunction and severe sepsis. Sepsis resolved   NSTEMI (non-ST elevated myocardial infarction) ([place]) Troponin trend: 27 on presentation, peaked at 12,000.   Pt has no chest pain and EKG non-acute.   Pt also had NSTEMI during prior  admission in July with troponin peak > 9k.  Medical management was recommended after discussion with cardiology and patient's family.  He is not a candidate for invasive intervention.   --Heparin contraindicated with active bleeding and anemia requiring transfusions --cont medical mnx   Lactic acidosis, resolved Due to combination of hypovolemia with bleeding, and infection/UTI.   CAD (coronary artery disease) Resume ASA after discharge.  Fecal impaction ([place]) CT done for hematuria work up showed rectum distended with stool. --had BM after presentation.   Dementia ([place]) No behavioral issues noted. Patient is followed by hospice at ALF.     Glaucoma Continue home eye drop regimen.   History of seizure No acute issues. --Continue Keppra   History of supraventricular tachycardia --Continue metoprolol.   Malnutrition of moderate degree Moderate Malnutrition related to chronic illness as evidenced by moderate fat depletion, moderate muscle depletion. --supplements per dietician   CKD 2a  Chronic kidney disease 3a, ruled out  --Cr 1.1 and GFR >60 prior to discharge.  Can not determine if pt has AKI.    Discharge Diagnoses:  Principal Problem:   Acute cystitis with hematuria Active Problems:   ABLA (acute blood loss anemia)   Severe sepsis (HCC)   NSTEMI (non-ST elevated myocardial infarction) (HCC)   CAD (coronary artery disease)   Lactic acidosis   Gross hematuria   Dementia (HCC)   Fecal impaction (HCC)   History of seizure   Glaucoma   History of supraventricular tachycardia   Chronic kidney disease, stage 3a ([place])  Malnutrition of moderate degree   30 Day Unplanned Readmission Risk Score    Flowsheet Row ED to Hosp-Admission (Current) from 07/25/2022 in [facility]  30 Day Unplanned Readmission Risk Score (%) 20.1 Filed at 07/31/2022 0801       This score is the patient's risk of an unplanned readmission within 30 days of being  discharged (0 -100%). The score is based on dignosis, age, lab data, medications, orders, and past utilization.   Low:  0-14.9   Medium: 15-21.9   High: 22-29.9   Extreme: 30 and above         Discharge Instructions:  Allergies as of 08/03/2022       Reactions   Cephalexin Other (See Comments)   Ppd [tuberculin Purified Protein Derivative] Other (See Comments)   "Allergic," per MAR   Tuberculin Tests Other (See Comments)   "Allergic," per [facility]        Medication List     TAKE these medications    acetaminophen 325 MG tablet Commonly known as: TYLENOL Take 2 tablets (650 mg total) by mouth every 4 (four) hours as needed for mild pain (or temp > 37.5 C (99.5 F)). What changed:  when to take this reasons to take this additional instructions   aspirin 81 MG chewable tablet Chew 81 mg by mouth in the morning.   Aveeno Eczema Therapy 1 % Crea Generic drug: Colloidal Oatmeal Apply 1 application topically See admin instructions. Apply to affected areas 2 times a day   baclofen 10 MG tablet Commonly known as: LIORESAL Take 10 mg by mouth 3 (three) times daily.   brimonidine 0.2 % ophthalmic solution Commonly known as: ALPHAGAN Place 1 drop into both eyes 2 (two) times daily.   brinzolamide 1 % ophthalmic suspension Commonly known as: AZOPT Place 1 drop into both eyes in the morning and at bedtime.   feeding supplement (PRO-STAT SUGAR FREE 64) Liqd Take 30 mLs by mouth daily. CHERRY   hydrocortisone cream 1 % Apply 1 application topically 2 (two) times daily.   ipratropium-albuterol 0.5-2.5 (3) MG/3ML Soln Commonly known as: DUONEB Take 3 mLs by nebulization every 4 (four) hours as needed (Shortness of Breath or Wheezing).   lactose free nutrition Liqd Take 237 mLs by mouth 3 (three) times daily. VANILLA   levETIRAcetam 500 MG tablet Commonly known as: KEPPRA Take 1 tablet (500 mg total) by mouth 2 (two) times daily.   loperamide 2 MG tablet Commonly known  as: IMODIUM A-D Take 2 mg by mouth as needed (after each loose stool- up to 4 doses in 12 hours and call MD if this diarrhea persists longer than 12 hours or is accompanied by severe abdominal pain).   Melatonin 10 MG Tabs Take 10 mg by mouth at bedtime as needed.   metoprolol succinate 25 MG 24 hr tablet Commonly known as: TOPROL-XL Take 12.5 mg by mouth daily.   Minerin Creme Crea Apply 1 Application topically in the morning and at bedtime.   multivitamins ther. w/minerals Tabs tablet Take 1 tablet by mouth daily.   Mylanta 200-200-20 MG/5ML suspension Generic drug: alum & mag hydroxide-simeth Take 30 mLs by mouth once as needed for indigestion or heartburn.   nitrofurantoin (macrocrystal-monohydrate) 100 MG capsule Commonly known as: MACROBID Take 1 capsule (100 mg total) by mouth every 12 (twelve) hours for 5 days.   nitroGLYCERIN 0.4 MG SL tablet Commonly known as: NITROSTAT Place 0.4 mg under the tongue every 5 (five) minutes  x 3 doses as needed for chest pain (AND CALL EMS IF NO RESOLUTION).   ondansetron 4 MG disintegrating tablet Commonly known as: ZOFRAN-ODT Take 1 tablet (4 mg total) by mouth every 6 (six) hours as needed for nausea.   Pepto-Bismol 262 MG/15ML suspension Generic drug: bismuth subsalicylate Take 15 mLs by mouth every 2 (two) hours as needed for indigestion or diarrhea or loose stools (max of 6 doses in 24 hours).   senna-docusate 8.6-50 MG tablet Commonly known as: Senokot-S Take 1 tablet by mouth at bedtime as needed for mild constipation.   Travoprost (BAK Free) 0.004 % Soln ophthalmic solution Commonly known as: TRAVATAN Place 1 drop into both eyes at bedtime.   traZODone 50 MG tablet Commonly known as: DESYREL Take 50 mg by mouth at bedtime.   zinc oxide 20 % ointment Apply 1 application  topically See admin instructions. Apply topically to sacrum twice daily         Follow-up Information     H[place], Rosalyn Charters, MD Follow up in 1  week(s).   Specialty: Internal Medicine Contact information: [address] [place] [address] [phone]                 Allergies  Allergen Reactions   Cephalexin Other (See Comments)   Ppd [Tuberculin Purified Protein Derivative] Other (See Comments)    "Allergic," per MAR   Tuberculin Tests Other (See Comments)    "Allergic," per [facility]     The results of significant diagnostics from this hospitalization (including imaging, microbiology, ancillary and laboratory) are listed below for reference.   Procedures/Studies: DG Chest Port 1 View  Result Date: 07/26/2022 CLINICAL DATA:  Shortness of breath EXAM: PORTABLE CHEST 1 VIEW COMPARISON:  05/21/2022 and prior studies FINDINGS: The cardiomediastinal silhouette is unchanged. There is no evidence of focal airspace disease, pulmonary edema, suspicious pulmonary nodule/mass, pleural effusion, or pneumothorax. A calcified granuloma within the LOWER RIGHT lung again noted. No acute bony abnormalities are identified. IMPRESSION: No active disease. Electronically Signed   By: Margarette Canada M.D.   On: 07/26/2022 16:12   CT HEMATURIA WORKUP  Result Date: 07/26/2022 CLINICAL DATA:  Gross hematuria. Lower abdominal and pelvic pain. Nausea and vomiting. EXAM: CT ABDOMEN AND PELVIS WITHOUT AND WITH CONTRAST TECHNIQUE: Multidetector CT imaging of the abdomen and pelvis was performed following the standard protocol before and following the bolus administration of intravenous contrast. RADIATION DOSE REDUCTION: This exam was performed according to the departmental dose-optimization program which includes automated exposure control, adjustment of the mA and/or kV according to patient size and/or use of iterative reconstruction technique. CONTRAST:  188m OMNIPAQUE IOHEXOL 300 MG/ML  SOLN COMPARISON:  None Available. FINDINGS: Lower Chest: Bilateral lower lobe atelectasis versus scarring. Tiny bilateral pleural effusions versus pleural  thickening. Aortic and coronary atherosclerotic calcification incidentally noted. Hepatobiliary: No hepatic masses identified. Multiple small less than 1 cm calcified gallstones are seen, however there is no evidence of cholecystitis or biliary ductal dilatation. Pancreas:  No mass or inflammatory changes. Spleen: Within normal limits in size and appearance. Adrenals/Urinary Tract: No adrenal masses identified. No evidence of urolithiasis. No suspicious renal masses identified. Moderate hydroureteronephrosis is seen bilaterally to the level of the urinary bladder. No evidence of ureteral calculi or other obstructing etiology. Air is also noted throughout the renal collecting systems, ureters, and bladder. These findings are likely due to recent instrumentation and vesicoureteral reflux. Moderate blood clot is seen within the urinary bladder, however no definite soft  tissue masses identified. Stomach/Bowel: A moderate right inguinal hernia is seen which contains the terminal ileum and cecum. Normal appendix visualized. No evidence of obstruction, inflammatory process or abnormal fluid collections. Marked distention of the rectum by stool is seen, suspicious for fecal impaction. Vascular/Lymphatic: No pathologically enlarged lymph nodes. No acute vascular findings. Aortic atherosclerotic calcification incidentally noted. Reproductive:  No mass or other significant abnormality. Other:  None. Musculoskeletal:  No suspicious bone lesions identified. IMPRESSION: Moderate bilateral hydroureteronephrosis to the level of the urinary bladder. No ureteral calculi or other obstructing etiology visualized. Air throughout renal collecting systems, ureters, and bladder, likely due to recent instrumentation and vesicoureteral reflux. Moderate blood clot within urinary bladder. No definite bladder mass identified. Recommend cystoscopy for further evaluation. Moderate right inguinal hernia containing terminal ileum and cecum. No  evidence of bowel obstruction. Marked distention of the rectum by stool, suspicious for fecal impaction. Cholelithiasis. No radiographic evidence of cholecystitis. Bilateral lower lobe atelectasis versus scarring, and tiny bilateral pleural effusions versus pleural thickening. Aortic Atherosclerosis (ICD10-I70.0). Electronically Signed   By: Marlaine Hind M.D.   On: 07/26/2022 12:42      Labs: BNP (last 3 results) No results for input(s): "BNP" in the last 8760 hours. Basic Metabolic Panel: Recent Labs  Lab 07/28/22 0945 07/29/22 0504 07/30/22 0610 07/31/22 0516 08/01/22 0556  NA 140 142 139 138 137  K 3.5 3.7 3.9 3.8 4.3  CL 112* 114* 112* 105 107  CO2 21* 22 21* 22 23  GLUCOSE 110* 89 92 97 95  BUN 29* 27* 24* 25* 27*  CREATININE 1.38* 1.32* 1.11 1.17 1.18  CALCIUM 7.7* 8.0* 7.9* 8.0* 8.1*  MG 2.2  --  2.0 2.1 2.1    Liver Function Tests: No results for input(s): "AST", "ALT", "ALKPHOS", "BILITOT", "PROT", "ALBUMIN" in the last 168 hours.  No results for input(s): "LIPASE", "AMYLASE" in the last 168 hours. No results for input(s): "AMMONIA" in the last 168 hours. CBC: Recent Labs  Lab 07/28/22 0945 07/28/22 1926 07/29/22 0504 07/30/22 0726 07/31/22 0516 08/01/22 0556  WBC 10.2  --  10.1 7.9 10.2 9.7  HGB 6.8* 6.7* 8.1* 8.4* 8.4* 8.6*  HCT 21.2* 21.2* 24.8* 25.7* 25.7* 26.5*  MCV 85.8  --  87.3 86.8 87.4 87.7  PLT 229  --  263 256 237 258    CBG: Recent Labs  Lab 08/01/22 1246 08/01/22 1608 08/01/22 2104 08/02/22 0739 08/03/22 0732  GLUCAP 120* 142* 112* 93 94     Sepsis Labs Recent Labs  Lab 07/29/22 0504 07/30/22 0726 07/31/22 0516 08/01/22 0556  WBC 10.1 7.9 10.2 9.7    Microbiology Recent Results (from the past 240 hour(s))  Culture, blood (single)     Status: None   Collection Time: 07/26/22 12:31 AM   Specimen: BLOOD  Result Value Ref Range Status   Specimen Description BLOOD BLOOD LEFT FOREARM  Final   Special Requests   Final     BOTTLES DRAWN AEROBIC AND ANAEROBIC Blood Culture adequate volume   Culture   Final    NO GROWTH 5 DAYS Performed at [facility], [address], [place], [place] [address]    Report Status 07/31/2022 FINAL  Final  Urine Culture     Status: Abnormal   Collection Time: 07/26/22 10:07 AM   Specimen: Urine, Clean Catch  Result Value Ref Range Status   Specimen Description   Final    URINE, CLEAN CATCH Performed at [facility], [place],  [address], [place] [address]    Special Requests   Final    NONE Performed at [facility] [place], [address] [place], [place] [address]    Culture (A)  Final    >=100,000 COLONIES/mL ESCHERICHIA COLI Confirmed Extended Spectrum Beta-Lactamase Producer (ESBL).  In bloodstream infections from ESBL organisms, carbapenems are preferred over piperacillin/tazobactam. They are shown to have a lower risk of mortality.    Report Status 07/30/2022 FINAL  Final   Organism ID, Bacteria ESCHERICHIA COLI (A)  Final      Susceptibility   Escherichia coli - MIC*    AMPICILLIN >=32 RESISTANT Resistant     CEFAZOLIN >=64 RESISTANT Resistant     CEFEPIME 4 INTERMEDIATE Intermediate     CEFTRIAXONE >=64 RESISTANT Resistant     CIPROFLOXACIN >=4 RESISTANT Resistant     GENTAMICIN <=1 SENSITIVE Sensitive     IMIPENEM <=0.25 SENSITIVE Sensitive     NITROFURANTOIN <=16 SENSITIVE Sensitive     TRIMETH/SULFA <=20 SENSITIVE Sensitive     AMPICILLIN/SULBACTAM 16 INTERMEDIATE Intermediate     PIP/TAZO <=4 SENSITIVE Sensitive     * >=100,000 COLONIES/mL ESCHERICHIA COLI  MRSA Next Gen by PCR, Nasal     Status: Abnormal   Collection Time: 07/26/22  1:17 PM   Specimen: Nasal Mucosa; Nasal Swab  Result Value Ref Range Status   MRSA by PCR Next Gen DETECTED (A) NOT DETECTED Final    Comment: RESULT CALLED TO, READ BACK BY AND VERIFIED WITH: C/UTE JACOBS 07/26/22 1615 SLM (NOTE) The GeneXpert MRSA Assay (FDA approved for NASAL specimens  only), is one component of a comprehensive MRSA colonization surveillance program. It is not intended to diagnose MRSA infection nor to guide or monitor treatment for MRSA infections. Test performance is not FDA approved in patients less than 32 years old. Performed at [facility], [address] [place], [place] [address]      Total time spend on discharging this patient, including the last patient exam, discussing the hospital stay, instructions for ongoing care as it relates to all pertinent caregivers, as well as preparing the medical discharge records, prescriptions, and/or referrals as applicable, is 35 minutes.    Fritzi Mandes, MD  [facility] 08/03/2022, 1:41 PM

## 2022-08-03 NOTE — TOC Progression Note (Signed)
Transition of Care [facility]) - Progression Note    Patient Details  Name: Ronald Matthews MRN: [id number] Date of Birth: May 29, 1933  Transition of Care [facility] CM/SW [place] Fredda Clarida, [place] Phone Number: 08/03/2022, 9:01 AM  Clinical Narrative:     CSW spoke with Melissa with [facility] who confirms patient is set up for discharge today back to [facility], as they have staff to admit patient with hospice today and all dme has been delivered.   CSW attempted to call [facility], no answer. Went to vm, unable to leave vm as vm is full.     Expected Discharge Plan: Assisted Living Barriers to Discharge: No Barriers Identified  Expected Discharge Plan and Services Expected Discharge Plan: Assisted Living       Living arrangements for the past 2 months: [place] Expected Discharge Date: 07/31/22                                     Social Determinants of Health (SDOH) Interventions    Readmission Risk Interventions     No data to display

## 2022-08-03 NOTE — Progress Notes (Signed)
Telephone call to the [facility]. Message left for Zigmund Daniel, Supervisor to return call concerning patient returning to the [facility]. B Iyania Denne RN,MHA,CCM Transition of Care Supervisor [phone]

## 2022-08-03 NOTE — Care Management Important Message (Signed)
Important Message  Patient Details  Name: Ronald Matthews MRN: [id number] Date of Birth: 10/21/1933   Medicare Important Message Given:  Other (see comment)  Disposition to discharge with hospice services.  Medicare IM withheld at this time out of respect for patient and family.    Dannette Barbara 08/03/2022, 9:43 AM

## 2022-08-03 NOTE — TOC Transition Note (Signed)
Transition of Care [facility]) - CM/SW Discharge Note   Patient Details  Name: Ronald Matthews MRN: [id number] Date of Birth: Jun 20, 1933  Transition of Care [facility]) CM/SW Contact:  Alberteen Sam, LCSW Phone Number: 08/03/2022, 2:16 PM   Clinical Narrative:     Patient to discharge back to [facility] today per approval from [place] [facility] [employer]. Per Zigmund Daniel she needs dc summary faxed to [phone], this has been faxed. [facility] has been called RN made aware. Melissa with [facility] reports all dme has been delivered to the facility and that they will have patient admitted back with hospice today.   No further questions or concerns at this time.   Final next level of care: Assisted Living Barriers to Discharge: No Barriers Identified   Patient Goals and CMS Choice Patient states their goals for this hospitalization and ongoing recovery are:: to go home CMS Medicare.gov Compare Post Acute Care list provided to:: Patient Represenative (must comment) Choice offered to / list presented to : Patient, Adult Children  Discharge Placement                    Patient and family notified of of transfer: 07/30/22  Discharge Plan and Services                                     Social Determinants of Health (SDOH) Interventions     Readmission Risk Interventions     No data to display

## 2023-06-03 DEATH — deceased
